# Patient Record
Sex: Male | Born: 1945
Health system: Southern US, Community
[De-identification: ages and names within clinical notes are randomized; demographics above are authoritative.]

## PROBLEM LIST (undated history)

## (undated) DIAGNOSIS — R7303 Prediabetes: Secondary | ICD-10-CM

## (undated) DIAGNOSIS — G4730 Sleep apnea, unspecified: Secondary | ICD-10-CM

## (undated) DIAGNOSIS — C61 Malignant neoplasm of prostate: Secondary | ICD-10-CM

## (undated) DIAGNOSIS — E119 Type 2 diabetes mellitus without complications: Secondary | ICD-10-CM

## (undated) DIAGNOSIS — B159 Hepatitis A without hepatic coma: Secondary | ICD-10-CM

## (undated) DIAGNOSIS — H5440 Blindness, one eye, unspecified eye: Secondary | ICD-10-CM

## (undated) DIAGNOSIS — IMO0002 Reserved for concepts with insufficient information to code with codable children: Secondary | ICD-10-CM

## (undated) DIAGNOSIS — M1990 Unspecified osteoarthritis, unspecified site: Secondary | ICD-10-CM

## (undated) DIAGNOSIS — R011 Cardiac murmur, unspecified: Secondary | ICD-10-CM

## (undated) DIAGNOSIS — E785 Hyperlipidemia, unspecified: Secondary | ICD-10-CM

## (undated) HISTORY — PX: COLONOSCOPY: SHX174

## (undated) HISTORY — DX: Hyperlipidemia, unspecified: E78.5

## (undated) HISTORY — PX: JOINT REPLACEMENT: SHX530

## (undated) HISTORY — DX: Unspecified osteoarthritis, unspecified site: M19.90

## (undated) HISTORY — PX: EYE SURGERY: SHX253

## (undated) HISTORY — DX: Cardiac murmur, unspecified: R01.1

---

## 2013-08-21 ENCOUNTER — Encounter (INDEPENDENT_AMBULATORY_CARE_PROVIDER_SITE_OTHER): Payer: Self-pay

## 2013-08-21 ENCOUNTER — Other Ambulatory Visit: Payer: Self-pay | Admitting: Family Medicine

## 2013-08-21 ENCOUNTER — Ambulatory Visit
Admission: RE | Admit: 2013-08-21 | Discharge: 2013-08-21 | Disposition: A | Discharge status: Home / Self Care | Payer: Medicare HMO | Source: Ambulatory Visit | Attending: Family Medicine | Admitting: Family Medicine

## 2013-08-21 DIAGNOSIS — E785 Hyperlipidemia, unspecified: Secondary | ICD-10-CM

## 2013-10-05 ENCOUNTER — Other Ambulatory Visit: Payer: Self-pay | Admitting: Orthopaedic Surgery

## 2013-10-16 DIAGNOSIS — IMO0002 Reserved for concepts with insufficient information to code with codable children: Secondary | ICD-10-CM

## 2013-10-16 HISTORY — DX: Reserved for concepts with insufficient information to code with codable children: IMO0002

## 2013-10-25 ENCOUNTER — Emergency Department (HOSPITAL_COMMUNITY): Payer: Medicare HMO

## 2013-10-25 ENCOUNTER — Encounter (HOSPITAL_COMMUNITY): Payer: Self-pay | Admitting: Emergency Medicine

## 2013-10-25 ENCOUNTER — Emergency Department (HOSPITAL_COMMUNITY)
Admission: EM | Admit: 2013-10-25 | Discharge: 2013-10-25 | Disposition: A | Discharge status: Home / Self Care | Payer: Medicare HMO | Attending: Emergency Medicine | Admitting: Emergency Medicine

## 2013-10-25 DIAGNOSIS — M71032 Abscess of bursa, left wrist: Secondary | ICD-10-CM

## 2013-10-25 DIAGNOSIS — Z8546 Personal history of malignant neoplasm of prostate: Secondary | ICD-10-CM | POA: Insufficient documentation

## 2013-10-25 DIAGNOSIS — L02519 Cutaneous abscess of unspecified hand: Secondary | ICD-10-CM | POA: Diagnosis present

## 2013-10-25 DIAGNOSIS — M6789 Other specified disorders of synovium and tendon, multiple sites: Secondary | ICD-10-CM | POA: Insufficient documentation

## 2013-10-25 DIAGNOSIS — L03119 Cellulitis of unspecified part of limb: Secondary | ICD-10-CM

## 2013-10-25 HISTORY — DX: Malignant neoplasm of prostate: C61

## 2013-10-25 LAB — CBC WITH DIFFERENTIAL/PLATELET
Basophils Absolute: 0 10*3/uL (ref 0.0–0.1)
Basophils Relative: 0 % (ref 0–1)
EOS ABS: 0 10*3/uL (ref 0.0–0.7)
Eosinophils Relative: 1 % (ref 0–5)
HCT: 39.3 % (ref 39.0–52.0)
Hemoglobin: 13.4 g/dL (ref 13.0–17.0)
LYMPHS ABS: 2.2 10*3/uL (ref 0.7–4.0)
LYMPHS PCT: 30 % (ref 12–46)
MCH: 30.6 pg (ref 26.0–34.0)
MCHC: 34.1 g/dL (ref 30.0–36.0)
MCV: 89.7 fL (ref 78.0–100.0)
Monocytes Absolute: 0.6 10*3/uL (ref 0.1–1.0)
Monocytes Relative: 8 % (ref 3–12)
NEUTROS ABS: 4.6 10*3/uL (ref 1.7–7.7)
Neutrophils Relative %: 61 % (ref 43–77)
PLATELETS: 244 10*3/uL (ref 150–400)
RBC: 4.38 MIL/uL (ref 4.22–5.81)
RDW: 13.7 % (ref 11.5–15.5)
WBC: 7.5 10*3/uL (ref 4.0–10.5)

## 2013-10-25 LAB — BASIC METABOLIC PANEL
Anion gap: 13 (ref 5–15)
BUN: 19 mg/dL (ref 6–23)
CO2: 23 mEq/L (ref 19–32)
Calcium: 9.1 mg/dL (ref 8.4–10.5)
Chloride: 101 mEq/L (ref 96–112)
Creatinine, Ser: 0.87 mg/dL (ref 0.50–1.35)
GFR, EST NON AFRICAN AMERICAN: 86 mL/min — AB (ref >90)
GLUCOSE: 123 mg/dL — AB (ref 70–99)
POTASSIUM: 4.4 meq/L (ref 3.7–5.3)
SODIUM: 137 meq/L (ref 137–147)

## 2013-10-25 MED ORDER — OXYCODONE-ACETAMINOPHEN 5-325 MG PO TABS
1.0000 | ORAL_TABLET | Freq: Once | ORAL | Status: AC
  Administered 2013-10-25: 1 via ORAL
  Filled 2013-10-25: qty 1

## 2013-10-25 MED ORDER — OXYCODONE-ACETAMINOPHEN 5-325 MG PO TABS: 1.0000 | ORAL_TABLET | ORAL | Status: DC | PRN

## 2013-10-25 MED ORDER — CLINDAMYCIN HCL 300 MG PO CAPS: 300.0000 mg | ORAL_CAPSULE | Freq: Four times a day (QID) | ORAL | Status: DC

## 2013-10-25 MED ORDER — LIDOCAINE-EPINEPHRINE 2 %-1:100000 IJ SOLN
1.7000 mL | Freq: Once | INTRAMUSCULAR | Status: DC
  Filled 2013-10-25: qty 1.7

## 2013-10-25 MED ORDER — CLINDAMYCIN HCL 300 MG PO CAPS
300.0000 mg | ORAL_CAPSULE | Freq: Once | ORAL | Status: AC
  Administered 2013-10-25: 300 mg via ORAL
  Filled 2013-10-25 (×2): qty 1

## 2013-10-25 MED ORDER — LIDOCAINE HCL (PF) 1 % IJ SOLN
INTRAMUSCULAR | Status: AC
  Filled 2013-10-25: qty 10

## 2013-10-25 NOTE — ED Notes (Signed)
Patient at xray

## 2013-10-25 NOTE — Discharge Instructions (Signed)

## 2013-10-25 NOTE — ED Provider Notes (Signed)
CSN: [id number]     Arrival date & time 10/25/13  1448 History   First MD Initiated Contact with Patient 10/25/13 1622     Chief Complaint  Patient presents with  . Abscess    Patient gave verbal permission to utilize photo for medical documentation only The image was not stored on any personal device   Patient is a 68 y.o. male presenting with wrist pain. The history is provided by the patient and a significant other.  Wrist Pain This is a new problem. The current episode started more than 2 days ago. The problem occurs constantly. The problem has been gradually worsening. Pertinent negatives include no chest pain, no abdominal pain and no shortness of breath. Exacerbated by: palpation. The symptoms are relieved by rest.  pt reports left wrist pain for "months" but has noticed increased pain/swelling for past 3 days.  No trauma No fever/vomiting He was seen at PCP today and was sent for evaluation by Hand Surgery  Past Medical History  Diagnosis Date  . Prostate cancer    History reviewed. No pertinent past surgical history. History reviewed. No pertinent family history. History  Substance Use Topics  . Smoking status: Never Smoker   . Smokeless tobacco: Not on file  . Alcohol Use: No    Review of Systems  Constitutional: Negative for fever.  Respiratory: Negative for shortness of breath.   Cardiovascular: Negative for chest pain and leg swelling.  Gastrointestinal: Negative for vomiting and abdominal pain.  Skin: Positive for wound.  Neurological: Negative for weakness.  All other systems reviewed and are negative.     Allergies  Review of patient's allergies indicates no known allergies.  Home Medications   Prior to Admission medications   Not on File   BP 144/70  Pulse 96  Temp(Src) 97.7 F (36.5 C) (Oral)  Resp 20  Ht 5\' 8"  (1.727 m)  Wt 249 lb (112.946 kg)  BMI 37.87 kg/m2  SpO2 95% Physical Exam CONSTITUTIONAL: Well developed/well nourished HEAD:  Normocephalic/atraumatic ENMT: Mucous membranes moist NECK: supple no meningeal signs SPINE:entire spine nontender CV: S1/S2 noted, no murmurs/rubs/gallops noted LUNGS: Lungs are clear to auscultation bilaterally, no apparent distress ABDOMEN: soft, nontender, no rebound or guarding NEURO: Pt is awake/alert, moves all extremitiesx4 EXTREMITIES: pulses normal, full ROM. Pt with limited extension of left index finger (chronic) Area on left wrist warm and fluctuant without drainage.  See photo below SKIN: warm, color normal PSYCH: no abnormalities of mood noted      ED Course  Procedures INCISION AND DRAINAGE Performed by: Sharyon Cable Consent: Verbal consent obtained. Risks and benefits: risks, benefits and alternatives were discussed Type: abscess  Body area: left wrist  Anesthesia: local infiltration  Incision was made with a scalpel.  Local anesthetic: lidocaine 1%  Anesthetic total: 4 ml  Complexity: complex Blunt dissection to break up loculations  Drainage: purulent  Drainage amount: moderate  Patient tolerance: Patient tolerated the procedure well with no immediate complications.     4:46 PM D/w dr Grandville Silos He was called from PCP [facility] about this patient and initial concern for septic joint At this point it appears more to be abscess than septic joint Plan is to perform I&D in the ED 6:36 PM Pt tolerated I&D well He did have some fibrous material ,suspect chronic cyst that had superimposed abscess No evidence of septic joint He will return here for wound check in 2 days D/w dr Grandville Silos, he will see patient next week for definitive  management of his cyst  Labs Review  Labs Reviewed  BASIC METABOLIC PANEL - Abnormal; Notable for the following:    Glucose, Bld 123 (*)    GFR calc non Af Amer 86 (*)    All other components within normal limits  CBC WITH DIFFERENTIAL    Imaging Review Dg Wrist Complete Left  10/25/2013   CLINICAL DATA:  Pain  and swelling, no known injury  EXAM: LEFT WRIST - COMPLETE 3+ VIEW  COMPARISON:  None.  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 Radiocarpal joint joint space narrowing and spurring. Distal radial ulnar joint also shows degenerative change. Degenerative change in the intercarpal joints with a cyst in the proximal capitate. Mild degenerative change base of thumb. Diffuse soft tissue swelling  Negative for fracture.  IMPRESSION: Advanced osteoarthritis.   Electronically Signed   By: Franchot Gallo M.D.   On: 10/25/2013 17:03     MDM   Final diagnoses:  Abscess of bursa of wrist, left    Nursing notes including past medical history and social history reviewed and considered in documentation xrays reviewed and considered Labs/vital reviewed and considered     Sharyon Cable, MD 10/25/13 [phone]

## 2013-10-25 NOTE — ED Notes (Signed)
Pt with left wrist abscess and infection sent here by PCP for eval by Dr Grandville Silos

## 2013-10-26 ENCOUNTER — Encounter (HOSPITAL_COMMUNITY): Payer: Self-pay | Admitting: Emergency Medicine

## 2013-10-26 ENCOUNTER — Emergency Department (HOSPITAL_COMMUNITY): Payer: Medicare HMO

## 2013-10-26 ENCOUNTER — Emergency Department (HOSPITAL_COMMUNITY)
Admission: EM | Admit: 2013-10-26 | Discharge: 2013-10-26 | Disposition: A | Discharge status: Home / Self Care | Payer: Medicare HMO | Attending: Emergency Medicine | Admitting: Emergency Medicine

## 2013-10-26 DIAGNOSIS — M71032 Abscess of bursa, left wrist: Secondary | ICD-10-CM

## 2013-10-26 DIAGNOSIS — R42 Dizziness and giddiness: Secondary | ICD-10-CM | POA: Diagnosis not present

## 2013-10-26 DIAGNOSIS — R112 Nausea with vomiting, unspecified: Secondary | ICD-10-CM | POA: Insufficient documentation

## 2013-10-26 DIAGNOSIS — Z8546 Personal history of malignant neoplasm of prostate: Secondary | ICD-10-CM | POA: Diagnosis not present

## 2013-10-26 DIAGNOSIS — Z7982 Long term (current) use of aspirin: Secondary | ICD-10-CM | POA: Insufficient documentation

## 2013-10-26 DIAGNOSIS — R110 Nausea: Secondary | ICD-10-CM

## 2013-10-26 DIAGNOSIS — M6789 Other specified disorders of synovium and tendon, multiple sites: Secondary | ICD-10-CM | POA: Insufficient documentation

## 2013-10-26 DIAGNOSIS — Z79899 Other long term (current) drug therapy: Secondary | ICD-10-CM | POA: Insufficient documentation

## 2013-10-26 DIAGNOSIS — Z792 Long term (current) use of antibiotics: Secondary | ICD-10-CM | POA: Diagnosis not present

## 2013-10-26 LAB — PRO B NATRIURETIC PEPTIDE: Pro B Natriuretic peptide (BNP): 26.5 pg/mL (ref 0–125)

## 2013-10-26 LAB — BASIC METABOLIC PANEL
Anion gap: 16 — ABNORMAL HIGH (ref 5–15)
BUN: 16 mg/dL (ref 6–23)
CO2: 20 mEq/L (ref 19–32)
Calcium: 9.2 mg/dL (ref 8.4–10.5)
Chloride: 100 mEq/L (ref 96–112)
Creatinine, Ser: 0.78 mg/dL (ref 0.50–1.35)
GFR calc Af Amer: >90 mL/min (ref >90)
GFR calc non Af Amer: >90 mL/min (ref >90)
Glucose, Bld: 193 mg/dL — ABNORMAL HIGH (ref 70–99)
Potassium: 4.2 mEq/L (ref 3.7–5.3)
Sodium: 136 mEq/L — ABNORMAL LOW (ref 137–147)

## 2013-10-26 LAB — TROPONIN I: Troponin I: <0.3 ng/mL (ref <0.30)

## 2013-10-26 LAB — CBC WITH DIFFERENTIAL/PLATELET
Basophils Absolute: 0 10*3/uL (ref 0.0–0.1)
Basophils Relative: 0 % (ref 0–1)
Eosinophils Absolute: 0 10*3/uL (ref 0.0–0.7)
Eosinophils Relative: 0 % (ref 0–5)
HCT: 39.2 % (ref 39.0–52.0)
Hemoglobin: 13.6 g/dL (ref 13.0–17.0)
Lymphocytes Relative: 18 % (ref 12–46)
Lymphs Abs: 1.2 10*3/uL (ref 0.7–4.0)
MCH: 30.2 pg (ref 26.0–34.0)
MCHC: 34.7 g/dL (ref 30.0–36.0)
MCV: 86.9 fL (ref 78.0–100.0)
Monocytes Absolute: 0.6 10*3/uL (ref 0.1–1.0)
Monocytes Relative: 9 % (ref 3–12)
Neutro Abs: 4.8 10*3/uL (ref 1.7–7.7)
Neutrophils Relative %: 73 % (ref 43–77)
Platelets: 228 10*3/uL (ref 150–400)
RBC: 4.51 MIL/uL (ref 4.22–5.81)
RDW: 13.6 % (ref 11.5–15.5)
WBC: 6.6 10*3/uL (ref 4.0–10.5)

## 2013-10-26 MED ORDER — ONDANSETRON HCL 4 MG PO TABS: 4.0000 mg | ORAL_TABLET | Freq: Four times a day (QID) | ORAL | Status: DC

## 2013-10-26 MED ORDER — ONDANSETRON HCL 4 MG/2ML IJ SOLN
4.0000 mg | Freq: Once | INTRAMUSCULAR | Status: AC
  Administered 2013-10-26: 4 mg via INTRAVENOUS
  Filled 2013-10-26: qty 2

## 2013-10-26 MED ORDER — SODIUM CHLORIDE 0.9 % IV BOLUS (SEPSIS)
1000.0000 mL | Freq: Once | INTRAVENOUS | Status: AC
  Administered 2013-10-26: 1000 mL via INTRAVENOUS

## 2013-10-26 MED ORDER — IPRATROPIUM BROMIDE 0.02 % IN SOLN: 0.5000 mg | Freq: Once | RESPIRATORY_TRACT | Status: DC

## 2013-10-26 MED ORDER — ALBUTEROL (5 MG/ML) CONTINUOUS INHALATION SOLN: 10.0000 mg/h | INHALATION_SOLUTION | RESPIRATORY_TRACT | Status: DC

## 2013-10-26 NOTE — Discharge Instructions (Signed)
Nausea, Adult °Nausea means you feel sick to your stomach or need to throw up (vomit). It may be a sign of a more serious problem. If nausea gets worse, you may throw up. If you throw up a lot, you may lose too much body fluid (dehydration). °HOME CARE  °· Get plenty of rest. °· Ask your doctor how to replace body fluid losses (rehydrate). °· Eat small amounts of food. Sip liquids more often. °· Take all medicines as told by your doctor. °GET HELP RIGHT AWAY IF: °· You have a fever. °· You pass out (faint). °· You keep throwing up or have blood in your throw up. °· You are very weak, have dry lips or a dry mouth, or you are very thirsty (dehydrated). °· You have dark or bloody poop (stool). °· You have very bad chest or belly (abdominal) pain. °· You do not get better after 2 days, or you get worse. °· You have a headache. °MAKE SURE YOU: °· Understand these instructions. °· Will watch your condition. °· Will get help right away if you are not doing well or get worse. °Document Released: 01/21/2011 Document Revised: 04/26/2011 Document Reviewed: 01/21/2011 °ExitCare® Patient Information ©2015 [employer]. This information is not intended to replace advice given to you by your health care provider. Make sure you discuss any questions you have with your health care provider. ° °

## 2013-10-26 NOTE — ED Notes (Signed)
Seen yesterday for cyst (drained) on left hand, c/o N/V and dizziness this AM, VSS, ambulatory with assist, A/O - NAD

## 2013-10-26 NOTE — ED Provider Notes (Signed)
CSN: [id number]     Arrival date & time 10/26/13  1046 History   First MD Initiated Contact with Patient 10/26/13 1138     Chief Complaint  Patient presents with  . Nausea  . Emesis  . Dizziness     (Consider location/radiation/quality/duration/timing/severity/associated sxs/prior Treatment) HPI  68yM with nausea. Seen in ED yesterday and had I&D abscess to distal L wrist. Prescribed percocet/clindamycin and instructed to follow up with hand surgery. This morning had nausea. No abdominal pain. Reports pain in wrist significantly improved. Swelling has decreased. No fever or chills. No sick contacts. No diarrhea. No urinary complaints. Nausea did start shortly after taking percocet. No other new exposures that he is aware of.   Past Medical History  Diagnosis Date  . Prostate cancer    History reviewed. No pertinent past surgical history. No family history on file. History  Substance Use Topics  . Smoking status: Never Smoker   . Smokeless tobacco: Not on file  . Alcohol Use: No    Review of Systems  All systems reviewed and negative, other than as noted in HPI.   Allergies  Review of patient's allergies indicates no known allergies.  Home Medications   Prior to Admission medications   Medication Sig Start Date End Date Taking? Authorizing Provider  aspirin 325 MG EC tablet Take 325 mg by mouth daily.   Yes Historical Provider, MD  atorvastatin (LIPITOR) 10 MG tablet Take 10 mg by mouth every morning.   Yes Historical Provider, MD  clindamycin (CLEOCIN) 300 MG capsule Take 1 capsule (300 mg total) by mouth 4 (four) times daily. X 7 days 10/25/13  Yes Sharyon Cable, MD  oxyCODONE-acetaminophen (PERCOCET/ROXICET) 5-325 MG per tablet Take 1 tablet by mouth every 4 (four) hours as needed for severe pain. 10/25/13  Yes Sharyon Cable, MD  bicalutamide (CASODEX) 50 MG tablet Take 150 mg by mouth every morning.    Historical Provider, MD  ondansetron (ZOFRAN) 4 MG tablet  Take 1 tablet (4 mg total) by mouth every 6 (six) hours. 10/26/13   Virgel Manifold, MD   BP 132/68  Pulse 76  Temp(Src) 97.8 F (36.6 C) (Oral)  Resp 24  SpO2 95% Physical Exam  Nursing note and vitals reviewed. Constitutional: He appears well-developed and well-nourished. No distress.  HENT:  Head: Normocephalic and atraumatic.  Eyes: Conjunctivae are normal. Right eye exhibits no discharge. Left eye exhibits no discharge.  L cornea opaque  Neck: Neck supple.  Cardiovascular: Normal rate, regular rhythm and normal heart sounds.  Exam reveals no gallop and no friction rub.   No murmur heard. Pulmonary/Chest: Effort normal and breath sounds normal. No respiratory distress.  Abdominal: Soft. He exhibits no distension. There is no tenderness.  Musculoskeletal:       Arms: Some yellow staining of bandages. Removed. Lesion consistent with recently I&D'd abscess to depicted area. Some oozing of thin clear/translucent straw colored fluid. Able express a small amount more with palpation just distal to previous incision. Denies any pain with palpation in this area. No surrounding erythema or appreciably increased warmth. Able to actively range wrist but decreased to about 60 degree arc because of pain. NVI distally.   Neurological: He is alert.  Skin: Skin is warm and dry.  Psychiatric: He has a normal mood and affect. His behavior is normal. Thought content normal.    ED Course  Procedures (including critical care time) Labs Review Labs Reviewed  BASIC METABOLIC PANEL - Abnormal; Notable for  the following:    Sodium 136 (*)    Glucose, Bld 193 (*)    Anion gap 16 (*)    All other components within normal limits  CBC WITH DIFFERENTIAL  TROPONIN I  PRO B NATRIURETIC PEPTIDE    Imaging Review Dg Chest 2 View  10/26/2013   CLINICAL DATA:  Nausea, vomiting  EXAM: CHEST  2 VIEW  COMPARISON:  08/21/2013  FINDINGS: The heart size and mediastinal contours are within normal limits. Both lungs  are clear. The visualized skeletal structures are unremarkable.  IMPRESSION: No active cardiopulmonary disease.   Electronically Signed   By: Kathreen Devoid   On: 10/26/2013 12:32   Dg Wrist Complete Left  10/25/2013   CLINICAL DATA:  Pain and swelling, no known injury  EXAM: LEFT WRIST - COMPLETE 3+ VIEW  COMPARISON:  None.  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 Radiocarpal joint joint space narrowing and spurring. Distal radial ulnar joint also shows degenerative change. Degenerative change in the intercarpal joints with a cyst in the proximal capitate. Mild degenerative change base of thumb. Diffuse soft tissue swelling  Negative for fracture.  IMPRESSION: Advanced osteoarthritis.   Electronically Signed   By: Franchot Gallo M.D.   On: 10/25/2013 17:03     EKG Interpretation   Date/Time:  Friday October 26 2013 11:14:57 EDT Ventricular Rate:  69 PR Interval:  175 QRS Duration: 102 QT Interval:  441 QTC Calculation: 472 R Axis:   13 Text Interpretation:  Sinus rhythm Low voltage, precordial leads RSR' in  V1  No old tracing to compare Confirmed by Senath  MD, [place] ([id number]) on  10/26/2013 3:04:48 PM      MDM   Final diagnoses:  Nausea  Abscess of bursa, wrist, left    68yM with nausea. Recent I&D of abscess to dorsal aspect L wrist. Looks improved from pictures obtained yesterday. Reports pain significantly improved. There is continued drainage though. Does not look purulent. Clear straw colored fluid. Serous versus synovial? Discuss with Dr Grandville Silos, hand surgery. Feels that appropriate to continue abx at this time and follow-up in office.   Suspect nausea related to percocet as it began shortly after taking it. Denies abdominal pain or any other acute pain for that matter. Abdominal exam benign. Afebrile. Labs fairly unremarkable. Symptoms resolved after dose of zofran. Will DC with the same. Wound re-dressed and provided additional supplies. Continued care and return  precautions dicussed with pt/wife. FU with hand surgery.    Virgel Manifold, MD 10/26/13 [phone]

## 2013-11-09 ENCOUNTER — Other Ambulatory Visit: Payer: Self-pay | Admitting: Orthopedic Surgery

## 2013-11-09 DIAGNOSIS — M25532 Pain in left wrist: Secondary | ICD-10-CM

## 2013-11-18 ENCOUNTER — Ambulatory Visit
Admission: RE | Admit: 2013-11-18 | Discharge: 2013-11-18 | Disposition: A | Discharge status: Home / Self Care | Payer: Medicare HMO | Source: Ambulatory Visit | Attending: Orthopedic Surgery | Admitting: Orthopedic Surgery

## 2013-11-18 DIAGNOSIS — M25532 Pain in left wrist: Secondary | ICD-10-CM

## 2013-11-18 MED ORDER — GADOBENATE DIMEGLUMINE 529 MG/ML IV SOLN
20.0000 mL | Freq: Once | INTRAVENOUS | Status: AC | PRN
  Administered 2013-11-18: 20 mL via INTRAVENOUS

## 2013-11-20 ENCOUNTER — Encounter (HOSPITAL_COMMUNITY): Payer: Self-pay | Admitting: Pharmacy Technician

## 2013-11-22 NOTE — Pre-Procedure Instructions (Signed)
Samuel [name]  11/22/2013   Your procedure is scheduled on:  Tuesday, October 20th  Report to [facility] at [id number] AM.  Call this number if you have problems the morning of surgery: [phone]   Remember:   Do not eat food or drink liquids after midnight.   Take these medicines the morning of surgery with A SIP OF WATER: none   Do not wear jewelry, make-up or nail polish.  Do not wear lotions, powders, or perfumes. You may wear deodorant.  Do not shave 48 hours prior to surgery. Men may shave face and neck.  Do not bring valuables to the hospital.  [facility] is not responsible  for any belongings or valuables.               Contacts, dentures or bridgework may not be worn into surgery.  Leave suitcase in the car. After surgery it may be brought to your room.  For patients admitted to the hospital, discharge time is determined by your  treatment team.           Please read over the following fact sheets that you were given: Pain Booklet, Coughing and Deep Breathing, Blood Transfusion Information, MRSA Information and Surgical Site Infection Prevention [place] - Preparing for Surgery  Before surgery, you can play an important role.  Because skin is not sterile, your skin needs to be as free of germs as possible.  You can reduce the number of germs on you skin by washing with CHG (chlorahexidine gluconate) soap before surgery.  CHG is an antiseptic cleaner which kills germs and bonds with the skin to continue killing germs even after washing.  Please DO NOT use if you have an allergy to CHG or antibacterial soaps.  If your skin becomes reddened/irritated stop using the CHG and inform your nurse when you arrive at Short Stay.  Do not shave (including legs and underarms) for at least 48 hours prior to the first CHG shower.  You may shave your face.  Please follow these instructions carefully:   1.  Shower with CHG Soap the night before surgery and the morning of  Surgery.  2.  If you choose to wash your hair, wash your hair first as usual with your normal shampoo.  3.  After you shampoo, rinse your hair and body thoroughly to remove the shampoo.  4.  Use CHG as you would any other liquid soap.  You can apply CHG directly to the skin and wash gently with scrungie or a clean washcloth.  5.  Apply the CHG Soap to your body ONLY FROM THE NECK DOWN.  Do not use on open wounds or open sores.  Avoid contact with your eyes, ears, mouth and genitals (private parts).  Wash genitals (private parts) with your normal soap.  6.  Wash thoroughly, paying special attention to the area where your surgery will be performed.  7.  Thoroughly rinse your body with warm water from the neck down.  8.  DO NOT shower/wash with your normal soap after using and rinsing off the CHG Soap.  9.  Pat yourself dry with a clean towel.            10.  Wear clean pajamas.            11.  Place clean sheets on your bed the night of your first shower and do not sleep with pets.  Day of Surgery  Do not apply  any lotions/deoderants the morning of surgery.  Please wear clean clothes to the hospital/surgery center.

## 2013-11-23 ENCOUNTER — Encounter (HOSPITAL_COMMUNITY): Payer: Self-pay

## 2013-11-23 ENCOUNTER — Encounter (HOSPITAL_COMMUNITY)
Admission: RE | Admit: 2013-11-23 | Discharge: 2013-11-23 | Disposition: A | Discharge status: Home / Self Care | Payer: Medicare HMO | Source: Ambulatory Visit | Attending: Orthopaedic Surgery | Admitting: Orthopaedic Surgery

## 2013-11-23 DIAGNOSIS — Z8546 Personal history of malignant neoplasm of prostate: Secondary | ICD-10-CM | POA: Diagnosis not present

## 2013-11-23 DIAGNOSIS — E119 Type 2 diabetes mellitus without complications: Secondary | ICD-10-CM | POA: Insufficient documentation

## 2013-11-23 DIAGNOSIS — H5442 Blindness, left eye, normal vision right eye: Secondary | ICD-10-CM | POA: Diagnosis not present

## 2013-11-23 DIAGNOSIS — M1711 Unilateral primary osteoarthritis, right knee: Secondary | ICD-10-CM | POA: Diagnosis not present

## 2013-11-23 DIAGNOSIS — Z0000 Encounter for general adult medical examination without abnormal findings: Secondary | ICD-10-CM | POA: Insufficient documentation

## 2013-11-23 HISTORY — DX: Type 2 diabetes mellitus without complications: E11.9

## 2013-11-23 HISTORY — DX: Blindness, one eye, unspecified eye: H54.40

## 2013-11-23 HISTORY — DX: Hepatitis a without hepatic coma: B15.9

## 2013-11-23 HISTORY — DX: Sleep apnea, unspecified: G47.30

## 2013-11-23 LAB — URINALYSIS, ROUTINE W REFLEX MICROSCOPIC
Bilirubin Urine: NEGATIVE
Glucose, UA: NEGATIVE mg/dL
Hgb urine dipstick: NEGATIVE
KETONES UR: NEGATIVE mg/dL
LEUKOCYTES UA: NEGATIVE
Nitrite: NEGATIVE
Protein, ur: NEGATIVE mg/dL
Specific Gravity, Urine: 1.011 (ref 1.005–1.030)
Urobilinogen, UA: 0.2 mg/dL (ref 0.0–1.0)
pH: 5.5 (ref 5.0–8.0)

## 2013-11-23 LAB — BASIC METABOLIC PANEL
Anion gap: 16 — ABNORMAL HIGH (ref 5–15)
BUN: 15 mg/dL (ref 6–23)
CALCIUM: 9.6 mg/dL (ref 8.4–10.5)
CHLORIDE: 102 meq/L (ref 96–112)
CO2: 18 meq/L — AB (ref 19–32)
CREATININE: 0.8 mg/dL (ref 0.50–1.35)
GFR, EST NON AFRICAN AMERICAN: 89 mL/min — AB (ref >90)
Glucose, Bld: 114 mg/dL — ABNORMAL HIGH (ref 70–99)
POTASSIUM: 4.3 meq/L (ref 3.7–5.3)
Sodium: 136 mEq/L — ABNORMAL LOW (ref 137–147)

## 2013-11-23 LAB — CBC WITH DIFFERENTIAL/PLATELET
BASOS PCT: 1 % (ref 0–1)
Basophils Absolute: 0 10*3/uL (ref 0.0–0.1)
Eosinophils Absolute: 0 10*3/uL (ref 0.0–0.7)
Eosinophils Relative: 1 % (ref 0–5)
HCT: 41.8 % (ref 39.0–52.0)
HEMOGLOBIN: 14.2 g/dL (ref 13.0–17.0)
Lymphocytes Relative: 46 % (ref 12–46)
Lymphs Abs: 2.2 10*3/uL (ref 0.7–4.0)
MCH: 30 pg (ref 26.0–34.0)
MCHC: 34 g/dL (ref 30.0–36.0)
MCV: 88.4 fL (ref 78.0–100.0)
MONOS PCT: 7 % (ref 3–12)
Monocytes Absolute: 0.3 10*3/uL (ref 0.1–1.0)
Neutro Abs: 2.1 10*3/uL (ref 1.7–7.7)
Neutrophils Relative %: 45 % (ref 43–77)
PLATELETS: 204 10*3/uL (ref 150–400)
RBC: 4.73 MIL/uL (ref 4.22–5.81)
RDW: 14 % (ref 11.5–15.5)
WBC: 4.7 10*3/uL (ref 4.0–10.5)

## 2013-11-23 LAB — ABO/RH: ABO/RH(D): B POS

## 2013-11-23 LAB — TYPE AND SCREEN
ABO/RH(D): B POS
Antibody Screen: NEGATIVE

## 2013-11-23 LAB — COMPREHENSIVE METABOLIC PANEL
ALBUMIN: 4 g/dL (ref 3.5–5.2)
ALT: 19 U/L (ref 0–53)
AST: 26 U/L (ref 0–37)
Alkaline Phosphatase: 101 U/L (ref 39–117)
Anion gap: 18 — ABNORMAL HIGH (ref 5–15)
BUN: 16 mg/dL (ref 6–23)
CALCIUM: 9.8 mg/dL (ref 8.4–10.5)
CO2: 16 meq/L — AB (ref 19–32)
Chloride: 102 mEq/L (ref 96–112)
Creatinine, Ser: 1.33 mg/dL (ref 0.50–1.35)
GFR calc Af Amer: 61 mL/min — ABNORMAL LOW (ref >90)
GFR calc non Af Amer: 53 mL/min — ABNORMAL LOW (ref >90)
Glucose, Bld: 111 mg/dL — ABNORMAL HIGH (ref 70–99)
Potassium: 4.6 mEq/L (ref 3.7–5.3)
SODIUM: 136 meq/L — AB (ref 137–147)
TOTAL PROTEIN: 8 g/dL (ref 6.0–8.3)
Total Bilirubin: 0.4 mg/dL (ref 0.3–1.2)

## 2013-11-23 LAB — PROTIME-INR
INR: 0.99 (ref 0.00–1.49)
Prothrombin Time: 13.1 seconds (ref 11.6–15.2)

## 2013-11-23 LAB — SURGICAL PCR SCREEN
MRSA, PCR: NEGATIVE
Staphylococcus aureus: NEGATIVE

## 2013-11-23 LAB — APTT: APTT: 24 s (ref 24–37)

## 2013-11-28 NOTE — H&P (Signed)
TOTAL KNEE ADMISSION H&P  Patient is being admitted for right total knee arthroplasty.  Subjective:  Chief Complaint:right knee pain.  HPI: Samuel [name], 68 y.o. male, has a history of pain and functional disability in the right knee due to arthritis and has failed non-surgical conservative treatments for greater than 12 weeks to includeNSAID's and/or analgesics, corticosteriod injections, flexibility and strengthening excercises, use of assistive devices, weight reduction as appropriate and activity modification.  Onset of symptoms was gradual, starting 5 years ago with gradually worsening course since that time. The patient noted no past surgery on the right knee(s).  Patient currently rates pain in the right knee(s) at 10 out of 10 with activity. Patient has night pain, worsening of pain with activity and weight bearing, pain that interferes with activities of daily living, crepitus and joint swelling.  Patient has evidence of subchondral cysts, subchondral sclerosis, periarticular osteophytes and joint space narrowing by imaging studies. There is no active infection.  There are no active problems to display for this patient.  Past Medical History  Diagnosis Date  . Prostate cancer   . Sleep apnea     ????   O2 DESAT  10/26/13  IN ED    . Hepatitis A     25 YRS AGO  . Blind left eye   . Diabetes mellitus without complication     ?? borderline  being checked    Past Surgical History  Procedure Laterality Date  . Joint replacement      LT KNEE 3 YRS AGO   . Eye surgery      LEFT AYE AS CHILD     No prescriptions prior to admission   No Known Allergies  History  Substance Use Topics  . Smoking status: Former Research scientist (life sciences)  . Smokeless tobacco: Not on file  . Alcohol Use: No    No family history on file.   Review of Systems  Musculoskeletal: Positive for joint pain.       Right knee.  All other systems reviewed and are negative.   Objective:  Physical Exam  Constitutional: He  is oriented to person, place, and time. He appears well-developed and well-nourished.  HENT:  Head: Normocephalic and atraumatic.  Eyes: Pupils are equal, round, and reactive to light.  Neck: Normal range of motion.  Cardiovascular: Normal rate and regular rhythm.   Respiratory: Effort normal.  GI: Soft.  Musculoskeletal:  Right knee has no scars and no effusion.  A mild varus deformity.  His range of motion is about 0-100.  Hip motion is good and straight leg raise is negative.  Sensation and motor function are intact in both feet with palpable pulses on both sides.  His opposite knee has a healed incision with no effusion and motion from 0-100 as well.    Neurological: He is alert and oriented to person, place, and time.  Skin: Skin is warm and dry.  Psychiatric: He has a normal mood and affect. His behavior is normal. Judgment and thought content normal.    Vital signs in last 24 hours:    Labs:   There is no height or weight on file to calculate BMI.   Imaging Review Plain radiographs demonstrate severe degenerative joint disease of the right knee(s). The overall alignment isneutral. The bone quality appears to be good for age and reported activity level.  Assessment/Plan:  End stage arthritis, right knee   The patient history, physical examination, clinical judgment of the provider and imaging studies are  consistent with end stage degenerative joint disease of the right knee(s) and total knee arthroplasty is deemed medically necessary. The treatment options including medical management, injection therapy arthroscopy and arthroplasty were discussed at length. The risks and benefits of total knee arthroplasty were presented and reviewed. The risks due to aseptic loosening, infection, stiffness, patella tracking problems, thromboembolic complications and other imponderables were discussed. The patient acknowledged the explanation, agreed to proceed with the plan and consent was  signed. Patient is being admitted for inpatient treatment for surgery, pain control, PT, OT, prophylactic antibiotics, VTE prophylaxis, progressive ambulation and ADL's and discharge planning. The patient is planning to be discharged home with home health services

## 2013-12-03 MED ORDER — CEFAZOLIN SODIUM-DEXTROSE 2-3 GM-% IV SOLR
2.0000 g | INTRAVENOUS | Status: AC
  Administered 2013-12-04: 2 g via INTRAVENOUS
  Filled 2013-12-03: qty 50

## 2013-12-04 ENCOUNTER — Encounter (HOSPITAL_COMMUNITY): Payer: Medicare HMO | Admitting: Anesthesiology

## 2013-12-04 ENCOUNTER — Encounter (HOSPITAL_COMMUNITY): Payer: Self-pay

## 2013-12-04 ENCOUNTER — Inpatient Hospital Stay (HOSPITAL_COMMUNITY): Payer: Medicare HMO | Admitting: Anesthesiology

## 2013-12-04 ENCOUNTER — Encounter (HOSPITAL_COMMUNITY)
Admission: RE | Disposition: A | Discharge status: Home Health | Payer: Self-pay | Source: Ambulatory Visit | Attending: Orthopaedic Surgery

## 2013-12-04 ENCOUNTER — Inpatient Hospital Stay (HOSPITAL_COMMUNITY)
Admission: RE | Admit: 2013-12-04 | Discharge: 2013-12-06 | DRG: 470 | Disposition: A | Discharge status: Home Health | Payer: Medicare HMO | Source: Ambulatory Visit | Attending: Orthopaedic Surgery | Admitting: Orthopaedic Surgery

## 2013-12-04 DIAGNOSIS — G4730 Sleep apnea, unspecified: Secondary | ICD-10-CM | POA: Diagnosis present

## 2013-12-04 DIAGNOSIS — Z6837 Body mass index (BMI) 37.0-37.9, adult: Secondary | ICD-10-CM | POA: Diagnosis not present

## 2013-12-04 DIAGNOSIS — M179 Osteoarthritis of knee, unspecified: Principal | ICD-10-CM | POA: Diagnosis present

## 2013-12-04 DIAGNOSIS — E119 Type 2 diabetes mellitus without complications: Secondary | ICD-10-CM | POA: Diagnosis present

## 2013-12-04 DIAGNOSIS — Z96652 Presence of left artificial knee joint: Secondary | ICD-10-CM | POA: Diagnosis present

## 2013-12-04 DIAGNOSIS — M1711 Unilateral primary osteoarthritis, right knee: Secondary | ICD-10-CM | POA: Diagnosis present

## 2013-12-04 DIAGNOSIS — Z87891 Personal history of nicotine dependence: Secondary | ICD-10-CM

## 2013-12-04 DIAGNOSIS — E669 Obesity, unspecified: Secondary | ICD-10-CM | POA: Diagnosis present

## 2013-12-04 DIAGNOSIS — Z8546 Personal history of malignant neoplasm of prostate: Secondary | ICD-10-CM

## 2013-12-04 DIAGNOSIS — M25561 Pain in right knee: Secondary | ICD-10-CM | POA: Diagnosis present

## 2013-12-04 DIAGNOSIS — H5442 Blindness, left eye, normal vision right eye: Secondary | ICD-10-CM | POA: Diagnosis present

## 2013-12-04 HISTORY — DX: Reserved for concepts with insufficient information to code with codable children: IMO0002

## 2013-12-04 HISTORY — PX: TOTAL KNEE ARTHROPLASTY: SHX125

## 2013-12-04 LAB — GLUCOSE, CAPILLARY
GLUCOSE-CAPILLARY: 109 mg/dL — AB (ref 70–99)
Glucose-Capillary: 105 mg/dL — ABNORMAL HIGH (ref 70–99)
Glucose-Capillary: 145 mg/dL — ABNORMAL HIGH (ref 70–99)

## 2013-12-04 SURGERY — ARTHROPLASTY, KNEE, TOTAL
Anesthesia: Spinal | Site: Knee | Laterality: Right

## 2013-12-04 MED ORDER — ACETAMINOPHEN 650 MG RE SUPP: 650.0000 mg | Freq: Four times a day (QID) | RECTAL | Status: DC | PRN

## 2013-12-04 MED ORDER — HYDROCODONE-ACETAMINOPHEN 5-325 MG PO TABS
1.0000 | ORAL_TABLET | ORAL | Status: DC | PRN
  Administered 2013-12-04 – 2013-12-05 (×2): 2 via ORAL
  Administered 2013-12-05: 1 via ORAL
  Administered 2013-12-05 – 2013-12-06 (×2): 2 via ORAL
  Administered 2013-12-06: 1 via ORAL
  Filled 2013-12-04: qty 2
  Filled 2013-12-04 (×3): qty 1
  Filled 2013-12-04 (×2): qty 2
  Filled 2013-12-04: qty 1

## 2013-12-04 MED ORDER — ONDANSETRON HCL 4 MG PO TABS: 4.0000 mg | ORAL_TABLET | Freq: Four times a day (QID) | ORAL | Status: DC | PRN

## 2013-12-04 MED ORDER — FENTANYL CITRATE 0.05 MG/ML IJ SOLN
INTRAMUSCULAR | Status: AC
  Filled 2013-12-04: qty 5

## 2013-12-04 MED ORDER — LACTATED RINGERS IV SOLN
INTRAVENOUS | Status: DC
  Administered 2013-12-04: via INTRAVENOUS

## 2013-12-04 MED ORDER — SODIUM CHLORIDE 0.9 % IJ SOLN
INTRAMUSCULAR | Status: DC | PRN
  Administered 2013-12-04 (×2): 10 mL via INTRAVENOUS

## 2013-12-04 MED ORDER — MIDAZOLAM HCL 5 MG/5ML IJ SOLN
INTRAMUSCULAR | Status: DC | PRN
  Administered 2013-12-04: 2 mg via INTRAVENOUS

## 2013-12-04 MED ORDER — LACTATED RINGERS IV SOLN
INTRAVENOUS | Status: DC
  Administered 2013-12-04 (×2): via INTRAVENOUS

## 2013-12-04 MED ORDER — METOCLOPRAMIDE HCL 5 MG PO TABS
5.0000 mg | ORAL_TABLET | Freq: Three times a day (TID) | ORAL | Status: DC | PRN
  Filled 2013-12-04: qty 2

## 2013-12-04 MED ORDER — BICALUTAMIDE 50 MG PO TABS
150.0000 mg | ORAL_TABLET | Freq: Every morning | ORAL | Status: DC
  Administered 2013-12-05 – 2013-12-06 (×2): 150 mg via ORAL
  Filled 2013-12-04 (×2): qty 3

## 2013-12-04 MED ORDER — ROCURONIUM BROMIDE 50 MG/5ML IV SOLN
INTRAVENOUS | Status: AC
  Filled 2013-12-04: qty 1

## 2013-12-04 MED ORDER — ATORVASTATIN CALCIUM 10 MG PO TABS
10.0000 mg | ORAL_TABLET | Freq: Every morning | ORAL | Status: DC
  Filled 2013-12-04 (×2): qty 1

## 2013-12-04 MED ORDER — PHENOL 1.4 % MT LIQD: 1.0000 | OROMUCOSAL | Status: DC | PRN

## 2013-12-04 MED ORDER — FENTANYL CITRATE 0.05 MG/ML IJ SOLN
INTRAMUSCULAR | Status: DC | PRN
  Administered 2013-12-04: 50 ug via INTRAVENOUS
  Administered 2013-12-04 (×2): 25 ug via INTRAVENOUS

## 2013-12-04 MED ORDER — PROPOFOL INFUSION 10 MG/ML OPTIME
INTRAVENOUS | Status: DC | PRN
  Administered 2013-12-04: 140 ug/kg/min via INTRAVENOUS

## 2013-12-04 MED ORDER — ONDANSETRON HCL 4 MG/2ML IJ SOLN
INTRAMUSCULAR | Status: DC | PRN
  Administered 2013-12-04: 4 mg via INTRAVENOUS

## 2013-12-04 MED ORDER — ALUM & MAG HYDROXIDE-SIMETH 200-200-20 MG/5ML PO SUSP: 30.0000 mL | ORAL | Status: DC | PRN

## 2013-12-04 MED ORDER — OXYCODONE HCL 5 MG PO TABS: 5.0000 mg | ORAL_TABLET | Freq: Once | ORAL | Status: DC | PRN

## 2013-12-04 MED ORDER — ONDANSETRON HCL 4 MG/2ML IJ SOLN
INTRAMUSCULAR | Status: AC
  Filled 2013-12-04: qty 2

## 2013-12-04 MED ORDER — SODIUM CHLORIDE 0.9 % IR SOLN
Status: DC | PRN
  Administered 2013-12-04: 1000 mL

## 2013-12-04 MED ORDER — METHOCARBAMOL 1000 MG/10ML IJ SOLN
500.0000 mg | Freq: Four times a day (QID) | INTRAVENOUS | Status: DC | PRN
  Filled 2013-12-04: qty 5

## 2013-12-04 MED ORDER — MENTHOL 3 MG MT LOZG: 1.0000 | LOZENGE | OROMUCOSAL | Status: DC | PRN

## 2013-12-04 MED ORDER — CHLORHEXIDINE GLUCONATE 4 % EX LIQD: 60.0000 mL | Freq: Once | CUTANEOUS | Status: DC

## 2013-12-04 MED ORDER — HYDROMORPHONE HCL 1 MG/ML IJ SOLN
0.5000 mg | INTRAMUSCULAR | Status: DC | PRN
  Administered 2013-12-04 – 2013-12-06 (×6): 1 mg via INTRAVENOUS
  Filled 2013-12-04 (×6): qty 1

## 2013-12-04 MED ORDER — PROPOFOL 10 MG/ML IV BOLUS
INTRAVENOUS | Status: AC
  Filled 2013-12-04: qty 20

## 2013-12-04 MED ORDER — CEFAZOLIN SODIUM-DEXTROSE 2-3 GM-% IV SOLR
2.0000 g | Freq: Four times a day (QID) | INTRAVENOUS | Status: DC
  Filled 2013-12-04 (×2): qty 50

## 2013-12-04 MED ORDER — HYDROMORPHONE HCL 1 MG/ML IJ SOLN: 0.2500 mg | INTRAMUSCULAR | Status: DC | PRN

## 2013-12-04 MED ORDER — METOCLOPRAMIDE HCL 5 MG/ML IJ SOLN: 5.0000 mg | Freq: Three times a day (TID) | INTRAMUSCULAR | Status: DC | PRN

## 2013-12-04 MED ORDER — MIDAZOLAM HCL 2 MG/2ML IJ SOLN
INTRAMUSCULAR | Status: AC
  Filled 2013-12-04: qty 2

## 2013-12-04 MED ORDER — ONDANSETRON HCL 4 MG/2ML IJ SOLN
4.0000 mg | Freq: Four times a day (QID) | INTRAMUSCULAR | Status: AC | PRN
  Administered 2013-12-04: 4 mg via INTRAVENOUS

## 2013-12-04 MED ORDER — BUPIVACAINE IN DEXTROSE 0.75-8.25 % IT SOLN
INTRATHECAL | Status: DC | PRN
  Administered 2013-12-04: 15 mg via INTRATHECAL

## 2013-12-04 MED ORDER — ACETAMINOPHEN 325 MG PO TABS: 650.0000 mg | ORAL_TABLET | Freq: Four times a day (QID) | ORAL | Status: DC | PRN

## 2013-12-04 MED ORDER — ASPIRIN EC 325 MG PO TBEC
325.0000 mg | DELAYED_RELEASE_TABLET | Freq: Two times a day (BID) | ORAL | Status: DC
  Administered 2013-12-05 – 2013-12-06 (×3): 325 mg via ORAL
  Filled 2013-12-04 (×5): qty 1

## 2013-12-04 MED ORDER — SODIUM CHLORIDE 0.9 % IV SOLN
2000.0000 mg | INTRAVENOUS | Status: AC
Start: 2013-12-04 — End: 2013-12-04
  Administered 2013-12-04: 2000 mg via TOPICAL
  Filled 2013-12-04: qty 20

## 2013-12-04 MED ORDER — METHOCARBAMOL 500 MG PO TABS
500.0000 mg | ORAL_TABLET | Freq: Four times a day (QID) | ORAL | Status: DC | PRN
  Administered 2013-12-05 – 2013-12-06 (×3): 500 mg via ORAL
  Filled 2013-12-04 (×4): qty 1

## 2013-12-04 MED ORDER — LIDOCAINE HCL (CARDIAC) 20 MG/ML IV SOLN
INTRAVENOUS | Status: AC
  Filled 2013-12-04: qty 5

## 2013-12-04 MED ORDER — CEFAZOLIN SODIUM-DEXTROSE 2-3 GM-% IV SOLR
2.0000 g | Freq: Four times a day (QID) | INTRAVENOUS | Status: AC
  Administered 2013-12-04 – 2013-12-05 (×2): 2 g via INTRAVENOUS
  Filled 2013-12-04 (×2): qty 50

## 2013-12-04 MED ORDER — SODIUM CHLORIDE 0.9 % IV SOLN
10.0000 mg | INTRAVENOUS | Status: DC | PRN
  Administered 2013-12-04: 10 ug/min via INTRAVENOUS

## 2013-12-04 MED ORDER — LACTATED RINGERS IV SOLN
INTRAVENOUS | Status: DC
Start: 2013-12-04 — End: 2013-12-04
  Administered 2013-12-04: 09:00:00 via INTRAVENOUS

## 2013-12-04 MED ORDER — BISACODYL 5 MG PO TBEC
5.0000 mg | DELAYED_RELEASE_TABLET | Freq: Every day | ORAL | Status: DC | PRN
Start: 2013-12-04 — End: 2013-12-06

## 2013-12-04 MED ORDER — BUPIVACAINE LIPOSOME 1.3 % IJ SUSP
20.0000 mL | INTRAMUSCULAR | Status: AC
  Administered 2013-12-04: 20 mL
  Filled 2013-12-04: qty 20

## 2013-12-04 MED ORDER — ONDANSETRON HCL 4 MG/2ML IJ SOLN
4.0000 mg | Freq: Four times a day (QID) | INTRAMUSCULAR | Status: DC | PRN
Start: 2013-12-04 — End: 2013-12-06
  Administered 2013-12-04: 4 mg via INTRAVENOUS
  Filled 2013-12-04: qty 2

## 2013-12-04 MED ORDER — DIPHENHYDRAMINE HCL 12.5 MG/5ML PO ELIX: 12.5000 mg | ORAL_SOLUTION | ORAL | Status: DC | PRN

## 2013-12-04 MED ORDER — OXYCODONE HCL 5 MG/5ML PO SOLN: 5.0000 mg | Freq: Once | ORAL | Status: DC | PRN

## 2013-12-04 MED ORDER — DOCUSATE SODIUM 100 MG PO CAPS
100.0000 mg | ORAL_CAPSULE | Freq: Two times a day (BID) | ORAL | Status: DC
  Administered 2013-12-04 – 2013-12-06 (×4): 100 mg via ORAL
  Filled 2013-12-04 (×6): qty 1

## 2013-12-04 SURGICAL SUPPLY — 68 items
BANDAGE ELASTIC 4 VELCRO ST LF (GAUZE/BANDAGES/DRESSINGS) ×2 IMPLANT
BANDAGE ELASTIC 6 VELCRO ST LF (GAUZE/BANDAGES/DRESSINGS) ×2 IMPLANT
BANDAGE ESMARK 6X9 LF (GAUZE/BANDAGES/DRESSINGS) ×1 IMPLANT
BENZOIN TINCTURE PRP APPL 2/3 (GAUZE/BANDAGES/DRESSINGS) ×2 IMPLANT
BLADE SAG 18X100X1.27 (BLADE) IMPLANT
BLADE SAGITTAL 13X1.27X60 (BLADE) IMPLANT
BLADE SAGITTAL 25.0X1.19X90 (BLADE) IMPLANT
BLADE SURG 10 STRL SS (BLADE) ×2 IMPLANT
BLADE SURG ROTATE 9660 (MISCELLANEOUS) IMPLANT
BNDG ELASTIC 6X10 VLCR STRL LF (GAUZE/BANDAGES/DRESSINGS) ×2 IMPLANT
BNDG ESMARK 6X9 LF (GAUZE/BANDAGES/DRESSINGS) ×2
BNDG GAUZE ELAST 4 BULKY (GAUZE/BANDAGES/DRESSINGS) ×4 IMPLANT
BOWL SMART MIX CTS (DISPOSABLE) ×2 IMPLANT
CAP UPCHARGE REVISION TRAY ×2 IMPLANT
CAPT RP KNEE ×2 IMPLANT
CEMENT HV SMART SET (Cement) ×4 IMPLANT
CLSR STERI-STRIP ANTIMIC 1/2X4 (GAUZE/BANDAGES/DRESSINGS) ×2 IMPLANT
COVER SURGICAL LIGHT HANDLE (MISCELLANEOUS) ×2 IMPLANT
CUFF TOURNIQUET SINGLE 34IN LL (TOURNIQUET CUFF) ×2 IMPLANT
CUFF TOURNIQUET SINGLE 44IN (TOURNIQUET CUFF) IMPLANT
DRAPE EXTREMITY T 121X128X90 (DRAPE) ×2 IMPLANT
DRAPE PROXIMA HALF (DRAPES) ×2 IMPLANT
DRAPE U-SHAPE 47X51 STRL (DRAPES) ×2 IMPLANT
DRSG ADAPTIC 3X8 NADH LF (GAUZE/BANDAGES/DRESSINGS) ×2 IMPLANT
DRSG PAD ABDOMINAL 8X10 ST (GAUZE/BANDAGES/DRESSINGS) ×2 IMPLANT
DURAPREP 26ML APPLICATOR (WOUND CARE) ×2 IMPLANT
ELECT REM PT RETURN 9FT ADLT (ELECTROSURGICAL) ×2
ELECTRODE REM PT RTRN 9FT ADLT (ELECTROSURGICAL) ×1 IMPLANT
GAUZE SPONGE 4X4 12PLY STRL (GAUZE/BANDAGES/DRESSINGS) ×2 IMPLANT
GLOVE BIO SURGEON STRL SZ 6.5 (GLOVE) ×4 IMPLANT
GLOVE BIO SURGEON STRL SZ7 (GLOVE) ×8 IMPLANT
GLOVE BIO SURGEON STRL SZ8 (GLOVE) ×4 IMPLANT
GLOVE BIOGEL PI IND STRL 8 (GLOVE) ×2 IMPLANT
GLOVE BIOGEL PI INDICATOR 8 (GLOVE) ×2
GOWN STRL REUS W/ TWL LRG LVL3 (GOWN DISPOSABLE) ×3 IMPLANT
GOWN STRL REUS W/ TWL XL LVL3 (GOWN DISPOSABLE) ×2 IMPLANT
GOWN STRL REUS W/TWL LRG LVL3 (GOWN DISPOSABLE) ×3
GOWN STRL REUS W/TWL XL LVL3 (GOWN DISPOSABLE) ×2
HANDPIECE INTERPULSE COAX TIP (DISPOSABLE) ×1
HOOD PEEL AWAY FACE SHEILD DIS (HOOD) ×4 IMPLANT
IMMOBILIZER KNEE 20 (SOFTGOODS) IMPLANT
IMMOBILIZER KNEE 22 UNIV (SOFTGOODS) ×2 IMPLANT
IMMOBILIZER KNEE 24 THIGH 36 (MISCELLANEOUS) IMPLANT
IMMOBILIZER KNEE 24 UNIV (MISCELLANEOUS)
KIT BASIN OR (CUSTOM PROCEDURE TRAY) ×2 IMPLANT
KIT ROOM TURNOVER OR (KITS) ×2 IMPLANT
MANIFOLD NEPTUNE II (INSTRUMENTS) ×2 IMPLANT
NEEDLE 22X1 1/2 (OR ONLY) (NEEDLE) ×2 IMPLANT
NEEDLE HYPO 21X1 ECLIPSE (NEEDLE) ×2 IMPLANT
NS IRRIG 1000ML POUR BTL (IV SOLUTION) ×2 IMPLANT
PACK TOTAL JOINT (CUSTOM PROCEDURE TRAY) ×2 IMPLANT
PAD ABD 8X10 STRL (GAUZE/BANDAGES/DRESSINGS) ×2 IMPLANT
PAD ARMBOARD 7.5X6 YLW CONV (MISCELLANEOUS) ×4 IMPLANT
SET HNDPC FAN SPRY TIP SCT (DISPOSABLE) ×1 IMPLANT
SPONGE GAUZE 4X4 12PLY STER LF (GAUZE/BANDAGES/DRESSINGS) ×2 IMPLANT
STAPLER VISISTAT 35W (STAPLE) IMPLANT
SUCTION FRAZIER TIP 10 FR DISP (SUCTIONS) ×2 IMPLANT
SUT MNCRL AB 3-0 PS2 18 (SUTURE) IMPLANT
SUT VIC AB 0 CT1 27 (SUTURE) ×2
SUT VIC AB 0 CT1 27XBRD ANBCTR (SUTURE) ×2 IMPLANT
SUT VIC AB 2-0 CT1 27 (SUTURE) ×2
SUT VIC AB 2-0 CT1 TAPERPNT 27 (SUTURE) ×2 IMPLANT
SUT VLOC 180 0 24IN GS25 (SUTURE) ×2 IMPLANT
SYR 50ML LL SCALE MARK (SYRINGE) ×2 IMPLANT
TOWEL OR 17X24 6PK STRL BLUE (TOWEL DISPOSABLE) ×2 IMPLANT
TOWEL OR 17X26 10 PK STRL BLUE (TOWEL DISPOSABLE) ×2 IMPLANT
TRAY FOLEY CATH 14FR (SET/KITS/TRAYS/PACK) IMPLANT
WATER STERILE IRR 1000ML POUR (IV SOLUTION) ×4 IMPLANT

## 2013-12-04 NOTE — Anesthesia Procedure Notes (Addendum)
Spinal  Patient location during procedure: OR Start time: 12/04/2013 10:29 AM End time: 12/04/2013 10:39 AM Staffing Anesthesiologist: Roderic Palau E Performed by: anesthesiologist  Preanesthetic Checklist Completed: patient identified, surgical consent, pre-op evaluation, timeout performed, IV checked, risks and benefits discussed and monitors and equipment checked Spinal Block Patient position: sitting Prep: Betadine Patient monitoring: cardiac monitor, continuous pulse ox and blood pressure Approach: midline Location: L3-4 Injection technique: single-shot Needle Needle type: Quincke and Pencan  Needle gauge: 24 G Needle length: 9 cm Needle insertion depth: 8 cm Additional Notes Functioning IV was confirmed and monitors were applied. Sterile prep and drape, including hand hygiene and sterile gloves were used. The patient was positioned and the spine was prepped. The skin was anesthetized with lidocaine.  Free flow of clear CSF was obtained prior to injecting local anesthetic into the CSF.  The spinal needle aspirated freely following injection.  The needle was carefully withdrawn.  The patient tolerated the procedure well.   Procedure Name: MAC Date/Time: 12/04/2013 10:49 AM Performed by: Kyung Rudd Pre-anesthesia Checklist: Patient identified, Emergency Drugs available, Suction available, Patient being monitored and Timeout performed Patient Re-evaluated:Patient Re-evaluated prior to inductionOxygen Delivery Method: Simple face mask Preoxygenation: Pre-oxygenation with 100% oxygen Intubation Type: IV induction Placement Confirmation: positive ETCO2

## 2013-12-04 NOTE — Progress Notes (Signed)
Moved to bay 18 as requested per [employer] charge.

## 2013-12-04 NOTE — Anesthesia Postprocedure Evaluation (Signed)
  Anesthesia Post-op Note  Patient: Samuel [name]  Procedure(s) Performed: Procedure(s): TOTAL KNEE ARTHROPLASTY (Right)  Patient Location: PACU  Anesthesia Type:Spinal  Level of Consciousness: awake, alert , oriented and patient cooperative  Airway and Oxygen Therapy: Patient Spontanous Breathing  Post-op Pain: none  Post-op Assessment: Post-op Vital signs reviewed, Patient's Cardiovascular Status Stable, Respiratory Function Stable, Patent Airway, No signs of Nausea or vomiting and Pain level controlled  Post-op Vital Signs: stable  Last Vitals:  Filed Vitals:   12/04/13 1800  BP: 122/58  Pulse: 62  Temp:   Resp: 23    Complications: No apparent anesthesia complications

## 2013-12-04 NOTE — Op Note (Signed)
PREOP DIAGNOSIS: DJD RIGHT KNEE POSTOP DIAGNOSIS: same PROCEDURE: RIGHT TKR ANESTHESIA: Spinal and MAC ATTENDING SURGEON: Francena Zender G ASSISTANT: Loni Dolly PA  INDICATIONS FOR PROCEDURE: Samuel [name] is a 68 y.o. male who has struggled for a long time with pain due to degenerative arthritis of the right knee.  The patient has failed many conservative non-operative measures and at this point has pain which limits the ability to sleep and walk.  The patient is offered total knee replacement.  Informed operative consent was obtained after discussion of possible risks of anesthesia, infection, neurovascular injury, DVT, and death.  The importance of the post-operative rehabilitation protocol to optimize result was stressed extensively with the patient.  SUMMARY OF FINDINGS AND PROCEDURE:  Samuel [name] was taken to the operative suite where under the above anesthesia a right knee replacement was performed.  There were advanced degenerative changes and the bone quality was excellent.  We used the DePuy LCS system and placed size large femur, 5 MBT tibia, 41 mm all polyethylene patella, and a size 12.5 mm spacer.  Loni Dolly PA-C assisted throughout and was invaluable to the completion of the case in that he helped retract and maintain exposure while I placed components.  He also helped close thereby minimizing OR time.  The patient was admitted for appropriate post-op care to include perioperative antibiotics and mechanical and pharmacologic measures for DVT prophylaxis.  DESCRIPTION OF PROCEDURE:  Samuel [name] was taken to the operative suite where the above anesthesia was applied.  The patient was positioned supine and prepped and draped in normal sterile fashion.  An appropriate time out was performed.  After the administration of kefzol pre-op antibiotic the leg was elevated and exsanguinated and a tourniquet inflated. A standard longitudinal incision was made on the anterior knee.  Dissection was  carried down to the extensor mechanism.  All appropriate anti-infective measures were used including the pre-operative antibiotic, betadine impregnated drape, and closed hooded exhaust systems for each member of the surgical team.  A medial parapatellar incision was made in the extensor mechanism and the knee cap flipped and the knee flexed.  Some residual meniscal tissues were removed along with any remaining ACL/PCL tissue.  A guide was placed on the tibia and a flat cut was made on it's superior surface.  An intramedullary guide was placed in the femur and was utilized to make anterior and posterior cuts creating an appropriate flexion gap.  A second intramedullary guide was placed in the femur to make a distal cut properly balancing the knee with an extension gap equal to the flexion gap.  The three bones sized to the above mentioned sizes and the appropriate guides were placed and utilized.  A trial reduction was done and the knee easily came to full extension and the patella tracked well on flexion.  The trial components were removed and all bones were cleaned with pulsatile lavage and then dried thoroughly.  Cement was mixed and was pressurized onto the bones followed by placement of the aforementioned components.  Excess cement was trimmed and pressure was held on the components until the cement had hardened.  The tourniquet was deflated and a small amount of bleeding was controlled with cautery and pressure.  The knee was irrigated thoroughly.  The extensor mechanism was re-approximated with V-loc suture in running fashion.  The knee was flexed and the repair was solid.  The subcutaneous tissues were re-approximated with #0 and #2-0 vicryl and the skin closed with a subcuticular stitch  and steristrips.  A sterile dressing was applied.  Intraoperative fluids, EBL, and tourniquet time can be obtained from anesthesia records.  DISPOSITION:  The patient was taken to recovery room in stable condition and  admitted for appropriate post-op care to include peri-operative antibiotic and DVT prophylaxis with mechanical and pharmacologic measures.  Malak Orantes G 12/04/2013, 12:23 PM

## 2013-12-04 NOTE — Transfer of Care (Signed)
Immediate Anesthesia Transfer of Care Note  Patient: Samuel [name]  Procedure(s) Performed: Procedure(s): TOTAL KNEE ARTHROPLASTY (Right)  Patient Location: PACU  Anesthesia Type:MAC  Level of Consciousness: awake, alert  and oriented  Airway & Oxygen Therapy: Patient Spontanous Breathing and Patient connected to face mask oxygen  Post-op Assessment: Report given to PACU RN and Post -op Vital signs reviewed and stable  Post vital signs: Reviewed and stable  Complications: No apparent anesthesia complications

## 2013-12-04 NOTE — Progress Notes (Signed)
Pt still having difficulty bending left knee. Dr.Jackson notified. Will hold pt in pacu until able to bend left knee. Sensation in BLE is increasing . No new orders. Will cont to monitor.

## 2013-12-04 NOTE — Progress Notes (Signed)
Scheduled procedure is on Medicare IP only list.  Will need order for INPATIENT post-op. Thanks you  

## 2013-12-04 NOTE — Progress Notes (Signed)
Orthopedic Tech Progress Note Patient Details:  Samuel [name] Aug 09, 1945 [id number]  CPM Right Knee CPM Right Knee: On Right Knee Flexion (Degrees): 90 Right Knee Extension (Degrees): 0 Additional Comments: trapeze bar patient helper Viewed order from doctor's order list  Hildred Priest 12/04/2013, 1:48 PM

## 2013-12-04 NOTE — Anesthesia Preprocedure Evaluation (Signed)
Anesthesia Evaluation  Patient identified by MRN, date of birth, ID band Patient awake    Reviewed: Allergy & Precautions, H&P , NPO status , Patient's Chart, lab work & pertinent test results  Airway Mallampati: II  Neck ROM: full    Dental   Pulmonary sleep apnea , former smoker,          Cardiovascular negative cardio ROS      Neuro/Psych    GI/Hepatic (+) Hepatitis -, AHepatitis 25 years ago.   Endo/Other  diabetes, Type 2Morbid obesity  Renal/GU      Musculoskeletal   Abdominal   Peds  Hematology   Anesthesia Other Findings   Reproductive/Obstetrics                           Anesthesia Physical Anesthesia Plan  ASA: II  Anesthesia Plan: Spinal   Post-op Pain Management:    Induction: Intravenous  Airway Management Planned: Simple Face Mask  Additional Equipment:   Intra-op Plan:   Post-operative Plan:   Informed Consent: I have reviewed the patients History and Physical, chart, labs and discussed the procedure including the risks, benefits and alternatives for the proposed anesthesia with the patient or authorized representative who has indicated his/her understanding and acceptance.     Plan Discussed with: CRNA, Anesthesiologist and Surgeon  Anesthesia Plan Comments:         Anesthesia Quick Evaluation

## 2013-12-04 NOTE — Interval H&P Note (Signed)
History and Physical Interval Note:  12/04/2013 9:33 AM  Samuel [name]  has presented today for surgery, with the diagnosis of RIGHT KNEE DEGENERATIVE JOINT DISEASE  The various methods of treatment have been discussed with the patient and family. After consideration of risks, benefits and other options for treatment, the patient has consented to  Procedure(s): TOTAL KNEE ARTHROPLASTY (Right) as a surgical intervention .  The patient's history has been reviewed, patient examined, no change in status, stable for surgery.  I have reviewed the patient's chart and labs.  Questions were answered to the patient's satisfaction.     [name]

## 2013-12-05 ENCOUNTER — Encounter (HOSPITAL_COMMUNITY): Payer: Self-pay | Admitting: General Practice

## 2013-12-05 LAB — BASIC METABOLIC PANEL
ANION GAP: 12 (ref 5–15)
BUN: 16 mg/dL (ref 6–23)
CALCIUM: 8.9 mg/dL (ref 8.4–10.5)
CO2: 22 mEq/L (ref 19–32)
CREATININE: 0.78 mg/dL (ref 0.50–1.35)
Chloride: 101 mEq/L (ref 96–112)
Glucose, Bld: 168 mg/dL — ABNORMAL HIGH (ref 70–99)
Potassium: 4.5 mEq/L (ref 3.7–5.3)
Sodium: 135 mEq/L — ABNORMAL LOW (ref 137–147)

## 2013-12-05 LAB — CBC
HEMATOCRIT: 35.7 % — AB (ref 39.0–52.0)
Hemoglobin: 11.9 g/dL — ABNORMAL LOW (ref 13.0–17.0)
MCH: 30.3 pg (ref 26.0–34.0)
MCHC: 33.3 g/dL (ref 30.0–36.0)
MCV: 90.8 fL (ref 78.0–100.0)
Platelets: 204 10*3/uL (ref 150–400)
RBC: 3.93 MIL/uL — ABNORMAL LOW (ref 4.22–5.81)
RDW: 14 % (ref 11.5–15.5)
WBC: 7.7 10*3/uL (ref 4.0–10.5)

## 2013-12-05 LAB — GLUCOSE, CAPILLARY: Glucose-Capillary: 176 mg/dL — ABNORMAL HIGH (ref 70–99)

## 2013-12-05 NOTE — Progress Notes (Signed)
Subjective: 1 Day Post-Op Procedure(s) (LRB): TOTAL KNEE ARTHROPLASTY (Right)  Activity level:  WBAT Diet tolerance:  Eating well Voiding:  ok Patient reports pain as mild and moderate.    Objective: Vital signs in last 24 hours: Temp:  [97.4 F (36.3 C)-98.6 F (37 C)] 98.3 F (36.8 C) (10/21 0512) Pulse Rate:  [52-81] 80 (10/21 0512) Resp:  [2-27] 17 (10/21 0512) BP: (111-141)/(56-81) 138/61 mmHg (10/21 0512) SpO2:  [96 %-100 %] 96 % (10/21 0512) Weight:  [112.492 kg (248 lb)] 112.492 kg (248 lb) (10/20 0812)  Labs:  Recent Labs  12/05/13 0400  HGB 11.9*    Recent Labs  12/05/13 0400  WBC 7.7  RBC 3.93*  HCT 35.7*  PLT 204    Recent Labs  12/05/13 0400  NA 135*  K 4.5  CL 101  CO2 22  BUN 16  CREATININE 0.78  GLUCOSE 168*  CALCIUM 8.9   No results found for this basename: LABPT, INR,  in the last 72 hours  Physical Exam:  Neurologically intact ABD soft Neurovascular intact Sensation intact distally Intact pulses distally Dorsiflexion/Plantar flexion intact Incision: dressing C/D/I and scant drainage No cellulitis present Compartment soft  Assessment/Plan:  1 Day Post-Op Procedure(s) (LRB): TOTAL KNEE ARTHROPLASTY (Right) Advance diet Up with therapy D/C IV fluids Plan for discharge tomorrow Discharge home with home health if doing well and cleared by PT  Continue ASA 325mg  BID for 2 weeks post op Follow up in the office in 2 weeks post op Dressing changed to [place], Priseis Cratty PAUL 12/05/2013, 7:42 AM

## 2013-12-05 NOTE — Evaluation (Signed)
Physical Therapy Evaluation Patient Details Name: Samuel [name] MRN: [id number] DOB: [birth year] Today's Date: 12/05/2013   History of Present Illness  Samuel [name] is a 67 y.o. Male s/p Rt TKA on 12/04/13. 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    Clinical Impression  Pt is s/p Rt TKA POD#1 resulting in the deficits listed below (see PT Problem List).  Pt will benefit from skilled PT to increase their independence and safety with mobility to allow discharge to the venue listed below. Pt mobilizing at min guard for 50' today with RW. Pt will have 24/7 supervision (A) upon D/C. Recommend HHPT at this time vs SNF.      Follow Up Recommendations Home health PT;Supervision/Assistance - 24 hour    Equipment Recommendations  None recommended by PT    Recommendations for Other Services       Precautions / Restrictions Precautions Precautions: Knee Precaution Booklet Issued: Yes (comment) Precaution Comments: given HEP handout and educated on knee precautions Required Braces or Orthoses: Knee Immobilizer - Right Knee Immobilizer - Right: On when out of bed or walking Restrictions Weight Bearing Restrictions: Yes RLE Weight Bearing: Weight bearing as tolerated      Mobility  Bed Mobility               General bed mobility comments: pt up in chair on arrival  Transfers Overall transfer level: Needs assistance Equipment used: Rolling walker (2 wheeled) Transfers: Sit to/from Stand Sit to Stand: Min guard         General transfer comment: min guard to steady from chair; cues for hand placement and safety with RW  Ambulation/Gait Ambulation/Gait assistance: Min guard Ambulation Distance (Feet): 50 Feet Assistive device: Rolling walker (2 wheeled) Gait Pattern/deviations: Step-to pattern;Decreased stance time - right;Decreased step length - left;Antalgic;Trunk flexed;Narrow base of support Gait velocity: decreased due to pain Gait  velocity interpretation: Below normal speed for age/gender General Gait Details: cues for step through gt sequencing and upright posture; pt limited by fatigue   Stairs            Wheelchair Mobility    Modified Rankin (Stroke Patients Only)       Balance Overall balance assessment: Needs assistance Sitting-balance support: No upper extremity supported;Feet supported Sitting balance-Leahy Scale: Fair     Standing balance support: During functional activity;Bilateral upper extremity supported Standing balance-Leahy Scale: Poor Standing balance comment: relies heavily on RW for balance                              Pertinent Vitals/Pain Pain Assessment: No/denies pain Pain Score: 6  Pain Location: Rt Knee when walking Pain Descriptors / Indicators: Constant;Discomfort Pain Intervention(s): Limited activity within patient's tolerance;Monitored during session;Repositioned    Home Living Family/patient expects to be discharged to:: Private residence Living Arrangements: Spouse/significant other Available Help at Discharge: Family;Available 24 hours/day Type of Home: House Home Access: Stairs to enter Entrance Stairs-Rails: None Entrance Stairs-Number of Steps: 1 small step Home Layout: One level Home Equipment: Bedside commode;Walker - 2 wheels Additional Comments: Pt with hx of Lt TKA    Prior Function Level of Independence: Independent               Hand Dominance   Dominant Hand: Right    Extremity/Trunk Assessment   Upper Extremity Assessment: Defer to OT evaluation  Lower Extremity Assessment: RLE deficits/detail RLE Deficits / Details: quad 2+/5; AAROM in sitting -5 to 50 degrees limited by pain     Cervical / Trunk Assessment: Normal  Communication   Communication: No difficulties  Cognition Arousal/Alertness: Awake/alert Behavior During Therapy: WFL for tasks assessed/performed Overall Cognitive Status: Within  Functional Limits for tasks assessed                      General Comments General comments (skin integrity, edema, etc.): discussed D/C recommendation with pt; pt will have 24/7 supervision upon D/C    Exercises Total Joint Exercises Ankle Circles/Pumps: AROM;Both;10 reps;Seated Quad Sets: AROM;Strengthening;Right;10 reps;Seated Hip ABduction/ADduction: AAROM;Strengthening;Right;10 reps;Seated Long Arc Quad: AAROM;Right;5 reps;Seated Goniometric ROM: see Rt LE assessment       Assessment/Plan    PT Assessment Patient needs continued PT services  PT Diagnosis Difficulty walking;Generalized weakness;Acute pain   PT Problem List Decreased strength;Decreased range of motion;Decreased activity tolerance;Decreased balance;Decreased mobility;Decreased knowledge of use of DME;Pain;Decreased knowledge of precautions  PT Treatment Interventions DME instruction;Gait training;Stair training;Functional mobility training;Therapeutic activities;Therapeutic exercise;Balance training;Neuromuscular re-education;Patient/family education   PT Goals (Current goals can be found in the Care Plan section) Acute Rehab PT Goals Patient Stated Goal: to get better then home PT Goal Formulation: With patient Time For Goal Achievement: 12/09/13 Potential to Achieve Goals: Good    Frequency 7X/week   Barriers to discharge        Co-evaluation               End of Session Equipment Utilized During Treatment: Gait belt;Right knee immobilizer Activity Tolerance: Patient tolerated treatment well Patient left: in chair;with call bell/phone within reach Nurse Communication: Mobility status;Precautions;Weight bearing status         Time: 5993-5701 PT Time Calculation (min): 17 min   Charges:   PT Evaluation $Initial PT Evaluation Tier I: 1 Procedure PT Treatments $Gait Training: 8-22 mins   PT G CodesGustavus [name], [place] 12/05/2013, 11:53 AM

## 2013-12-05 NOTE — Progress Notes (Signed)
Physical Therapy Treatment Patient Details Name: Samuel [name] MRN: [id number] DOB: [birth year] Today's Date: 12/05/2013    History of Present Illness Idan Prime is a 68 y.o. Male s/p Rt TKA on 12/04/13. 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      PT Comments    Pt greatly limited by pain this session. Pt required incr cues and min guard to steady with gt due to impulsiveness and incr gt abnormalities secondary to pain. Encouraged pt to get OOB for all meals. Pt still demo decr quadricep activation in Rt LE with exercises.   Follow Up Recommendations  Home health PT;Supervision/Assistance - 24 hour     Equipment Recommendations  None recommended by PT    Recommendations for Other Services       Precautions / Restrictions Precautions Precautions: Knee Precaution Booklet Issued: Yes (comment) Precaution Comments: reinforced no pillow under knee  Required Braces or Orthoses: Knee Immobilizer - Right Knee Immobilizer - Right: On when out of bed or walking Restrictions Weight Bearing Restrictions: Yes RLE Weight Bearing: Weight bearing as tolerated    Mobility  Bed Mobility Overal bed mobility: Needs Assistance Bed Mobility: Sit to Supine       Sit to supine: Min guard   General bed mobility comments: cues for sequencing to use Lt LE to (A) Rt LE into supine position; incr time due to pain; pt relying on trapeze bar to scoot in bed  Transfers Overall transfer level: Needs assistance Equipment used: Rolling walker (2 wheeled) Transfers: Sit to/from Stand Sit to Stand: Min guard         General transfer comment: min guard to steady and position RW; cues for sequencing and hand placement   Ambulation/Gait Ambulation/Gait assistance: Min guard Ambulation Distance (Feet): 55 Feet Assistive device: Rolling walker (2 wheeled) Gait Pattern/deviations: Step-to pattern;Decreased stance time - right;Decreased step length -  left;Antalgic;Trunk flexed Gait velocity: pt impulsively quick and unsafe speed; cues for proper speed and safety Gait velocity interpretation: Below normal speed for age/gender General Gait Details: pt with incr antaglic gt this session primarily due to pain; cues for proper gt sequencing and safety with RW; required multiple rest breaks due to fatigue    Stairs            Wheelchair Mobility    Modified Rankin (Stroke Patients Only)       Balance Overall balance assessment: Needs assistance Sitting-balance support: Feet supported;No upper extremity supported Sitting balance-Leahy Scale: Fair     Standing balance support: During functional activity;Bilateral upper extremity supported Standing balance-Leahy Scale: Poor Standing balance comment: pt heavily relies on RW due to decr ability to WB on Rt LE due to pain                     Cognition Arousal/Alertness: Awake/alert Behavior During Therapy: WFL for tasks assessed/performed Overall Cognitive Status: Within Functional Limits for tasks assessed                      Exercises Total Joint Exercises Ankle Circles/Pumps: AROM;Both;10 reps;Seated Quad Sets: AROM;Strengthening;Right;10 reps;Seated Short Arc Quad: AAROM;Strengthening;Right;5 reps;Other (comment) (limited due to pain and quad strength) Hip ABduction/ADduction: AAROM;Strengthening;Right;10 reps;Supine Long Arc Quad: AAROM;Right;5 reps;Seated Goniometric ROM: see Rt LE assessment     General Comments General comments (skin integrity, edema, etc.): encouarged pt to not refuse pain meds during hospitalization; pt in great deal of pain this session  Pertinent Vitals/Pain Pain Assessment: 0-10 Pain Score: 8  Pain Location: Rt knee primarily with flexion Pain Descriptors / Indicators: Aching Pain Intervention(s): Monitored during session;Limited activity within patient's tolerance;Repositioned;Ice applied;RN gave pain meds during  session    [place] expects to be discharged to:: Private residence Living Arrangements: Spouse/significant other Available Help at Discharge: Family;Available 24 hours/day Type of Home: House Home Access: Stairs to enter Entrance Stairs-Rails: None Home Layout: One level Home Equipment: Bedside commode;Walker - 2 wheels Additional Comments: Pt with hx of Lt TKA    Prior Function Level of Independence: Independent          PT Goals (current goals can now be found in the care plan section) Acute Rehab PT Goals Patient Stated Goal: to not have so much pain  PT Goal Formulation: With patient Time For Goal Achievement: 12/09/13 Potential to Achieve Goals: Good Progress towards PT goals: Progressing toward goals    Frequency  7X/week    PT Plan Current plan remains appropriate    Co-evaluation             End of Session Equipment Utilized During Treatment: Gait belt;Right knee immobilizer Activity Tolerance: Patient limited by pain Patient left: in bed;with call bell/phone within reach     Time: 1421-1445 PT Time Calculation (min): 24 min  Charges:  $Gait Training: 8-22 mins $Therapeutic Exercise: 8-22 mins                    G Codes:      Gustavus Bryant, [id number] 12/05/2013, 2:58 PM

## 2013-12-05 NOTE — Progress Notes (Signed)
Orthopedic Tech Progress Note Patient Details:  Samuel [name] 26-Aug-1945 [id number]  Patient ID: Samuel [name], male   DOB: 01/14/1946, 68 y.o.   MRN: [id number] Placed pt's lle in cpm @ 0-60 degrees @1510 ; will increase as pt tolerates; RN notified  Hildred Priest 12/05/2013, 3:13 PM

## 2013-12-05 NOTE — Progress Notes (Signed)
Occupational Therapy Evaluation Patient Details Name: Samuel [name] MRN: [id number] DOB: 1945-05-22 Today's Date: 12/05/2013    History of Present Illness Samuel [name] is a 68 y.o. Male s/p Rt TKA on 12/04/13. 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     Clinical Impression   PTA pt lived at home with his significant other and was independent with ADLs and functional mobility. Pt is limited by decreased ROM in Rt LE and pain which impair his independence with ADLs. Pt reports that his SO is 72 and cannot provide much physical assistance. At this time, feel that pt would benefit from [place] [facility] Pt will continue to benefit from acute OT to address LB ADLs and functional mobility.     Follow Up Recommendations  SNF;Supervision/Assistance - 24 hour    Equipment Recommendations  None recommended by OT    Recommendations for Other Services       Precautions / Restrictions Precautions Precautions: Knee Required Braces or Orthoses: Knee Immobilizer - Right Restrictions Weight Bearing Restrictions: Yes RLE Weight Bearing: Weight bearing as tolerated      Mobility Bed Mobility               General bed mobility comments: Pt sitting up in recliner when OT arrived.   Transfers Overall transfer level: Needs assistance Equipment used: Rolling walker (2 wheeled) Transfers: Sit to/from Stand Sit to Stand: Min assist         General transfer comment: Min (A) from recliner for safety and balance during transition. VC's for hand placement and sequencing. Pt with uncontrolled descent due to pain.     Balance Overall balance assessment: Needs assistance Sitting-balance support: No upper extremity supported;Feet supported Sitting balance-Leahy Scale: Fair     Standing balance support: Bilateral upper extremity supported;During functional activity Standing balance-Leahy Scale: Poor Standing balance comment: Pt requires UE support for  static standing                            ADL Overall ADL's : Needs assistance/impaired Eating/Feeding: Independent;Sitting   Grooming: Set up;Sitting Grooming Details (indicate cue type and reason): pt ambulated to bathroom for oral care, but required to sit down due to fatigue and deconditioning.  Upper Body Bathing: Set up;Sitting   Lower Body Bathing: Moderate assistance;Sit to/from stand   Upper Body Dressing : Set up;Sitting   Lower Body Dressing: Maximal assistance;Sit to/from stand   Toilet Transfer: Minimal assistance;Ambulation;RW;BSC           Functional mobility during ADLs: Minimal assistance;Rolling walker General ADL Comments: Pt limited by pain and weakness and requires VC's to slow down when walking as he attempts to rush to reach destination. Pt reports that his significant other is 72 and may not be able to provide the physical assistance that pt requires.      Vision  Pt with hx of Lt eye blindness and reports no change from baseline.                    Perception Perception Perception Tested?: No   Praxis Praxis Praxis tested?: Within functional limits    Pertinent Vitals/Pain Pain Assessment: 0-10 Pain Score: 6  Pain Location: Rt Knee when walking Pain Descriptors / Indicators: Constant;Discomfort Pain Intervention(s): Limited activity within patient's tolerance;Monitored during session;Repositioned     Hand Dominance Right   Extremity/Trunk Assessment Upper Extremity Assessment Upper  Extremity Assessment: Overall WFL for tasks assessed   Lower Extremity Assessment Lower Extremity Assessment: Defer to PT evaluation   Cervical / Trunk Assessment Cervical / Trunk Assessment: Normal   Communication Communication Communication: No difficulties   Cognition Arousal/Alertness: Lethargic;Suspect due to medications Behavior During Therapy: [facility] for tasks assessed/performed Overall Cognitive Status: Within Functional Limits  for tasks assessed                                Home Living Family/patient expects to be discharged to:: Private residence Living Arrangements: Spouse/significant other Available Help at Discharge: Family;Available 24 hours/day Type of Home: House Home Access: Stairs to enter [employer] of Steps: 1 small step   Home Layout: One level     Bathroom Shower/Tub: Occupational psychologist: Standard     Home Equipment: Bedside commode;Walker - 2 wheels   Additional Comments: Pt with hx of Lt TKA      Prior Functioning/Environment Level of Independence: Independent             OT Diagnosis: Generalized weakness;Acute pain   OT Problem List: Decreased strength;Decreased range of motion;Decreased activity tolerance;Impaired balance (sitting and/or standing);Decreased safety awareness;Decreased knowledge of use of DME or AE;Decreased knowledge of precautions;Pain   OT Treatment/Interventions: Self-care/ADL training;Therapeutic exercise;Energy conservation;DME and/or AE instruction;Therapeutic activities;Patient/family education;Balance training    OT Goals(Current goals can be found in the care plan section) Acute Rehab OT Goals Patient Stated Goal: to get stronger at rehab before going home OT Goal Formulation: With patient Time For Goal Achievement: 12/19/13 Potential to Achieve Goals: Good ADL Goals Pt Will Perform Grooming: with supervision;standing Pt Will Perform Lower Body Dressing: sit to/from stand;with min assist Pt Will Transfer to Toilet: ambulating;bedside commode;with supervision Pt Will Perform Toileting - Clothing Manipulation and hygiene: sit to/from stand;with min guard assist  OT Frequency: Min 1X/week   Barriers to D/C: Decreased caregiver support             End of Session Equipment Utilized During Treatment: Gait belt;Rolling walker;Right knee immobilizer CPM Right Knee CPM Right Knee: Off Nurse Communication:  Mobility status  Activity Tolerance: Patient tolerated treatment well Patient left: in chair;with call bell/phone within reach   Time: 0854-0920 OT Time Calculation (min): 26 min Charges:  OT General Charges $OT Visit: 1 Procedure OT Evaluation $Initial OT Evaluation Tier I: 1 Procedure OT Treatments $Self Care/Home Management : 8-22 mins  Villa Herb M 12/05/2013, 9:35 AM  Cyndie Chime, OTR/L Occupational Therapist [phone] (pager)

## 2013-12-06 LAB — CBC
HEMATOCRIT: 37 % — AB (ref 39.0–52.0)
Hemoglobin: 12.1 g/dL — ABNORMAL LOW (ref 13.0–17.0)
MCH: 29.7 pg (ref 26.0–34.0)
MCHC: 32.7 g/dL (ref 30.0–36.0)
MCV: 90.9 fL (ref 78.0–100.0)
Platelets: 211 10*3/uL (ref 150–400)
RBC: 4.07 MIL/uL — ABNORMAL LOW (ref 4.22–5.81)
RDW: 13.9 % (ref 11.5–15.5)
WBC: 8.8 10*3/uL (ref 4.0–10.5)

## 2013-12-06 MED ORDER — ASPIRIN 325 MG PO TBEC: 325.0000 mg | DELAYED_RELEASE_TABLET | Freq: Two times a day (BID) | ORAL | Status: DC

## 2013-12-06 MED ORDER — METHOCARBAMOL 500 MG PO TABS: 500.0000 mg | ORAL_TABLET | Freq: Four times a day (QID) | ORAL | Status: DC | PRN

## 2013-12-06 MED ORDER — HYDROCODONE-ACETAMINOPHEN 5-325 MG PO TABS
1.0000 | ORAL_TABLET | ORAL | Status: DC | PRN
Start: 2013-12-06 — End: 2015-04-09

## 2013-12-06 NOTE — Progress Notes (Signed)
Subjective: 2 Days Post-Op Procedure(s) (LRB): TOTAL KNEE ARTHROPLASTY (Right)  Activity level:  wbat Diet tolerance:  Eating well Voiding:  ok Patient reports pain as mild.    Objective: Vital signs in last 24 hours: Temp:  [98.3 F (36.8 C)-100.2 F (37.9 C)] 98.3 F (36.8 C) (10/22 0900) Pulse Rate:  [82-93] 85 (10/22 0900) Resp:  [14-18] 14 (10/22 1146) BP: (126-140)/(65-72) 137/72 mmHg (10/22 0900) SpO2:  [96 %-100 %] 98 % (10/22 1146)  Labs:  Recent Labs  12/05/13 0400 12/06/13 0500  HGB 11.9* 12.1*    Recent Labs  12/05/13 0400 12/06/13 0500  WBC 7.7 8.8  RBC 3.93* 4.07*  HCT 35.7* 37.0*  PLT 204 211    Recent Labs  12/05/13 0400  NA 135*  K 4.5  CL 101  CO2 22  BUN 16  CREATININE 0.78  GLUCOSE 168*  CALCIUM 8.9   No results found for this basename: LABPT, INR,  in the last 72 hours  Physical Exam:  Neurologically intact ABD soft Neurovascular intact Sensation intact distally Intact pulses distally Dorsiflexion/Plantar flexion intact Incision: dressing C/D/I No cellulitis present Compartment soft  Assessment/Plan:  2 Days Post-Op Procedure(s) (LRB): TOTAL KNEE ARTHROPLASTY (Right) Advance diet Up with therapy Discharge home with home health today. Follow up in office 2 weeks post op Continue on ASA 325mg  BID x 2 weeks post op for dvt prevention.     Ysabela Keisler, Larwance Sachs 12/06/2013, 12:48 PM

## 2013-12-06 NOTE — Care Management Note (Signed)
CARE MANAGEMENT NOTE 12/06/2013  Patient:  RORIK, VESPA   Account Number:  [id number]  Date Initiated:  12/06/2013  Documentation initiated by:  Ricki Miller  Subjective/Objective Assessment:   68 yr old male admitted with osteoarthritis of right knee, patient had a right total knee arthroplasty.     Action/Plan:   Patient was preoperatively setup with [name], no changes. Has assistance at discharge.   Anticipated DC Date:  12/06/2013   Anticipated DC Plan:  [place]  CM consult      PAC Choice  [place]   Choice offered to / List presented to:  C-1 Patient   DME arranged  [name]  3-N-1  [facility]      DME agency  [employer]     [employer] arranged  HH-2 PT      [place] agency  [place]   Status of service:  Completed, signed off Medicare Important Message given?  NA - LOS <3 / Initial given by admissions (If response is "NO", the following Medicare IM given date fields will be blank) Date Medicare IM given:   Medicare IM given by:   Date Additional Medicare IM given:   Additional Medicare IM given by:    Discharge Disposition:  [place]  Per UR Regulation:  Reviewed for med. necessity/level of care/duration of stay

## 2013-12-06 NOTE — Discharge Summary (Signed)
Patient ID: Samuel [name] MRN: [id number] DOB/AGE: May 30, 1945 68 y.o.  Admit date: 12/04/2013 Discharge date: 12/06/2013  Admission Diagnoses:  Principal Problem:   Right knee DJD   Discharge Diagnoses:  Same  Past Medical History  Diagnosis Date  . Prostate cancer   . Sleep apnea     ????   O2 DESAT  10/26/13  IN ED    . Hepatitis A     25 YRS AGO  . Blind left eye   . Diabetes mellitus without complication     ?? borderline  being checked  . Cyst in hand 10/2013    left hand, treated with antibiotic & pain medicine    Surgeries: Procedure(s): TOTAL KNEE ARTHROPLASTY on 12/04/2013   Consultants:    Discharged Condition: Improved  Hospital Course: Samuel [name] is an 68 y.o. male who was admitted 12/04/2013 for operative treatment ofRight knee DJD. Patient has severe unremitting pain that affects sleep, daily activities, and work/hobbies. After pre-op clearance the patient was taken to the operating room on 12/04/2013 and underwent  Procedure(s): TOTAL KNEE ARTHROPLASTY.    Patient was given perioperative antibiotics: Anti-infectives   Start     Dose/Rate Route Frequency Ordered Stop   12/04/13 2045  ceFAZolin (ANCEF) IVPB 2 g/50 mL premix     2 g 100 mL/hr over 30 Minutes Intravenous Every 6 hours 12/04/13 2031 12/05/13 0321   12/04/13 1730  ceFAZolin (ANCEF) IVPB 2 g/50 mL premix  Status:  Discontinued     2 g 100 mL/hr over 30 Minutes Intravenous Every 6 hours 12/04/13 1711 12/04/13 2031   12/04/13 0600  ceFAZolin (ANCEF) IVPB 2 g/50 mL premix     2 g 100 mL/hr over 30 Minutes Intravenous On call to O.R. 12/03/13 1401 12/04/13 1100       Patient was given sequential compression devices, early ambulation, and chemoprophylaxis to prevent DVT.  Patient benefited maximally from hospital stay and there were no complications.    Recent vital signs: Patient Vitals for the past 24 hrs:  BP Temp Temp src Pulse Resp SpO2  12/06/13 1146 - - - - 14 98 %  12/06/13 0900  137/72 mmHg 98.3 F (36.8 C) Oral 85 16 100 %  12/06/13 0800 - - - - 16 98 %  12/06/13 0524 126/68 mmHg 100.2 F (37.9 C) Oral 82 18 97 %  12/05/13 1933 140/65 mmHg 99.3 F (37.4 C) Oral 93 17 99 %  12/05/13 1554 - - - - 18 96 %     Recent laboratory studies:  Recent Labs  12/05/13 0400 12/06/13 0500  WBC 7.7 8.8  HGB 11.9* 12.1*  HCT 35.7* 37.0*  PLT 204 211  NA 135*  --   K 4.5  --   CL 101  --   CO2 22  --   BUN 16  --   CREATININE 0.78  --   GLUCOSE 168*  --   CALCIUM 8.9  --      Discharge Medications:     Medication List         aspirin 325 MG EC tablet  Take 1 tablet (325 mg total) by mouth 2 (two) times daily after a meal.     atorvastatin 10 MG tablet  Commonly known as:  LIPITOR  Take 10 mg by mouth every morning.     bicalutamide 50 MG tablet  Commonly known as:  CASODEX  Take 150 mg by mouth every morning.     HYDROcodone-acetaminophen 5-325  MG per tablet  Commonly known as:  NORCO/VICODIN  Take 1-2 tablets by mouth every 4 (four) hours as needed (breakthrough pain).     methocarbamol 500 MG tablet  Commonly known as:  ROBAXIN  Take 1 tablet (500 mg total) by mouth every 6 (six) hours as needed for muscle spasms.        Diagnostic Studies: Mr Wrist Left W Wo Contrast  11/19/2013   CLINICAL DATA:  Left wrist pain for 9 months. Cystic lesion. Numbness in thumb. Arthritis.  EXAM: MR OF THE LEFT WRIST WITHOUT AND WITH CONTRAST  TECHNIQUE: Multiplanar multisequence MR imaging of the left wrist was performed both before and after the administration of intravenous contrast.  CONTRAST:  54mL MULTIHANCE GADOBENATE DIMEGLUMINE 529 MG/ML IV SOLN  COMPARISON:  10/25/2013  FINDINGS: Despite efforts by the technologist and patient, motion artifact is present on today's exam and could not be eliminated. This reduces exam sensitivity and specificity. The patient had to get out of the scanner and move multiple times during the exam and had difficulty holding  still.  Ligaments: The intrinsic interosseous ligaments are very indistinct due to the motion artifact. The scapholunate ligament is thought to be torn. The lunatotriquetral ligament may well be torn. The lunate appears to be slightly distally and slightly anteriorly located with respect to its normal relationship with the scaphoid, which favors the scapholunate ligament tear. The lunate also appears distally located with respect to the triquetrum in a fashion favoring lunatotriquetral tear.  Triangular fibrocartilage: The TFC disc is so degenerated as to be difficult to visualize in various locations, and is felt to be torn. The severe loss of articular space between the triquetrum and ulna is not thought to be compatible with an on torn disc.  Tendons: There is considerable tenosynovitis in the extensor digitorum compartment. Mild second and third extensor compartment tenosynovitis. Mild tenosynovitis of the flexor tendons of the thumb and index finger.  Carpal tunnel/median nerve: Low-grade edema in the carpal tunnel with mild edema of the median nerve.  Guyon's canal: No impingement seen.  Joint/cartilage: Severe full-thickness thinning along much of the chondral surfaces. Marked synovitis. Osteochondral fragments medial to the pisotriquetral articulation appear to be potentially outside of the joint, just posterior to the abductor digiti minimi on image 10 of series 14.  Bones/carpal alignment: Severe erosive arthropathy. Extensive marrow edema in the carpus particularly the scaphoid, lunate, capitate, and hamate.  Other: No supplemental non-categorized findings.  IMPRESSION: 1. 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 There is felt to be disruption of the intrinsic interosseous ligaments as well as the TFC disc, subluxation of the lunate with respect to the scaphoid and triquetrum, potential joint disruption medially with extra-articular osteochondral fragments,  and tenosynovitis most striking in the extensor digitorum compartment but also involving the second and third extensor compartments and several tendons in the carpal tunnel. 2. Mild edema and mild expansion of the median nerve suggests low grade carpal tunnel syndrome.   Electronically Signed   By: Sherryl Barters M.D.   On: 11/19/2013 08:40    Disposition: 01-Home or Self Care      Discharge Instructions   Call MD / Call 911    Complete by:  As directed   If you experience chest pain or shortness of breath, CALL 911 and be transported to the hospital emergency room.  If you develope a fever above 101 F, pus (white drainage) or increased drainage or redness at  the wound, or calf pain, call your surgeon's office.     Constipation Prevention    Complete by:  As directed   Drink plenty of fluids.  Prune juice may be helpful.  You may use a stool softener, such as Colace (over the counter) 100 mg twice a day.  Use MiraLax (over the counter) for constipation as needed.     Diet - low sodium heart healthy    Complete by:  As directed      Increase activity slowly as tolerated    Complete by:  As directed            Follow-up Information   Follow up with Hessie Dibble, MD. Call in 2 weeks.   Specialty:  Orthopedic Surgery   Contact information:   [place] [address] [phone]       Follow up with [place]. (Someone from [facility] will contact you concerning start date and time for physical therapy.)    Specialty:  Home Health Services   Contact information:   [address] [place] [address] [phone]        Signed: Rich Fuchs 12/06/2013, 12:47 PM

## 2013-12-06 NOTE — Progress Notes (Signed)
Physical Therapy Treatment Patient Details Name: Samuel [name] MRN: [id number] DOB: Jul 03, 1945 Today's Date: 12/06/2013    History of Present Illness Samuel [name] is a 68 y.o. Male s/p Rt TKA on 12/04/13. 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      PT Comments    Pt at supervision to min guard for all mobility at this time. Reviewed stair management technique to safely enter house. Pt hopeful to D/C home today. Will have 24/7 supervision (A) available.   Follow Up Recommendations  Home health PT;Supervision/Assistance - 24 hour     Equipment Recommendations  None recommended by PT    Recommendations for Other Services       Precautions / Restrictions Precautions Precautions: Knee Precaution Comments: reviewed knee precautions and HEP Required Braces or Orthoses: Knee Immobilizer - Right Knee Immobilizer - Right: On when out of bed or walking Restrictions Weight Bearing Restrictions: Yes RLE Weight Bearing: Weight bearing as tolerated    Mobility  Bed Mobility Overal bed mobility: Modified Independent             General bed mobility comments: bed flattened to simulate home; no physical (A) needed  Transfers Overall transfer level: Needs assistance Equipment used: Rolling walker (2 wheeled) Transfers: Sit to/from Stand Sit to Stand: Supervision         General transfer comment: cues for technique; pt demo good ability to perform without physical (A)   Ambulation/Gait Ambulation/Gait assistance: Supervision Ambulation Distance (Feet): 150 Feet (75' x 2) Assistive device: Rolling walker (2 wheeled) Gait Pattern/deviations: Step-to pattern;Decreased stance time - right;Decreased step length - left;Antalgic Gait velocity: cues for safe gt speed   General Gait Details: cues for step through gt and upright posture; pt with shuffled step on Lt due to decr ability to WB; pt beginning to progress to more step through gt with  practice    Stairs Stairs: Yes Stairs assistance: Min guard Stair Management: No rails;Step to pattern;Forwards;With walker Number of Stairs: 1 General stair comments: cues for technique; min guard to block RW with stair management   Wheelchair Mobility    Modified Rankin (Stroke Patients Only)       Balance Overall balance assessment: Needs assistance         Standing balance support: During functional activity;Bilateral upper extremity supported Standing balance-Leahy Scale: Poor Standing balance comment: relies on RW for balance                     Cognition Arousal/Alertness: Awake/alert Behavior During Therapy: WFL for tasks assessed/performed Overall Cognitive Status: Within Functional Limits for tasks assessed                      Exercises Total Joint Exercises Ankle Circles/Pumps: AROM;Both;10 reps;Seated Quad Sets: AROM;Strengthening;Right;10 reps;Seated Heel Slides: AAROM;Right;10 reps;Supine Hip ABduction/ADduction: AAROM;Strengthening;Right;10 reps;Seated Long Arc Quad: AAROM;Right;Seated;10 reps Goniometric ROM: Rt LE in sitting AROM 65 degrees    General Comments General comments (skin integrity, edema, etc.): reviewed car transfer technique and HEP      Pertinent Vitals/Pain Pain Assessment: 0-10 Pain Score: 8  Pain Location: with walking only 8/10 in Rt knee  Pain Descriptors / Indicators: Aching;Sore Pain Intervention(s): Monitored during session;Premedicated before session;Repositioned    Home Living                      Prior Function  PT Goals (current goals can now be found in the care plan section) Acute Rehab PT Goals Patient Stated Goal: to go home today  PT Goal Formulation: With patient Time For Goal Achievement: 12/09/13 Potential to Achieve Goals: Good Progress towards PT goals: Progressing toward goals    Frequency  7X/week    PT Plan Current plan remains appropriate     Co-evaluation             End of Session Equipment Utilized During Treatment: Gait belt;Right knee immobilizer Activity Tolerance: Patient tolerated treatment well Patient left: in chair;with call bell/phone within reach     Time: 0806-0831 PT Time Calculation (min): 25 min  Charges:  $Gait Training: 8-22 mins $Therapeutic Exercise: 8-22 mins                    G CodesElie Confer [name], [place] 12/06/2013, 8:45 AM

## 2013-12-25 ENCOUNTER — Encounter: Payer: Self-pay | Admitting: Physical Therapy

## 2013-12-25 ENCOUNTER — Ambulatory Visit: Payer: Medicare HMO | Attending: Orthopaedic Surgery | Admitting: Physical Therapy

## 2013-12-25 VITALS — HR 115

## 2013-12-25 DIAGNOSIS — M25561 Pain in right knee: Secondary | ICD-10-CM | POA: Diagnosis not present

## 2013-12-25 DIAGNOSIS — Z96651 Presence of right artificial knee joint: Secondary | ICD-10-CM

## 2013-12-25 DIAGNOSIS — M25661 Stiffness of right knee, not elsewhere classified: Secondary | ICD-10-CM | POA: Diagnosis not present

## 2013-12-25 NOTE — Therapy (Signed)
Physical Therapy Evaluation  Patient Details  Name: Samuel [name] MRN: [id number] Date of Birth: [birth year]  Encounter Date: 12/25/2013      PT End of Session - 12/25/13 1408    Visit Number 1   Number of Visits 12   Date for PT Re-Evaluation 02/05/14   PT Start Time 1330   PT Stop Time 1406   PT Time Calculation (min) 36 min   Activity Tolerance Patient tolerated treatment well   Behavior During Therapy [facility] for tasks assessed/performed      Past Medical History  Diagnosis Date  . Prostate cancer   . Sleep apnea     ????   O2 DESAT  10/26/13  IN ED    . Hepatitis A     25 YRS AGO  . Blind left eye   . Diabetes mellitus without complication     ?? borderline  being checked  . Cyst in hand 10/2013    left hand, treated with antibiotic & pain medicine    Past Surgical History  Procedure Laterality Date  . Joint replacement      LT KNEE 3 YRS AGO   . Eye surgery      LEFT AYE AS CHILD   . 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 Total knee arthroplasty Right 12/04/2013    Procedure: TOTAL KNEE ARTHROPLASTY;  Surgeon: Hessie Dibble, MD;  Location: [place];  Service: Orthopedics;  Laterality: Right;    Pulse 115  SpO2 97%  Visit Diagnosis:  Status post total right knee replacement - Plan: PT PLAN OF CARE CERT/RE-CERT  Right knee pain - Plan: PT PLAN OF CARE CERT/RE-CERT  Knee stiffness, right - Plan: PT PLAN OF CARE CERT/RE-CERT      Subjective Assessment - 12/25/13 1331    Symptoms Pt. is a 68 y/o male who presents to [place] s/p R TKA on 12/04/13.  Pt. presents today with use of single point cane in the community and no device in the home.  Pt reports needing increased time with activities.     Limitations Sitting;Standing;Walking;House hold activities   How long can you sit comfortably? unlimited   How long can you stand comfortably? 5 min   How long can you walk comfortably? < 5 min   Patient Stated Goals decrease pain, improve ROM, functional  mobility including walking longer distances   Currently in Pain? Yes   Pain Score 5    Pain Location Knee   Pain Orientation Right   Pain Descriptors / Indicators Aching   Pain Type Surgical pain   Pain Onset 1 to 4 weeks ago   Pain Frequency Intermittent   Aggravating Factors  bending the knee   Pain Relieving Factors pain medication, rest   Effect of Pain on Daily Activities increased time with ADLs   Multiple Pain Sites No          [facility] PT Assessment - 12/25/13 1339    Assessment   Medical Diagnosis R TKA   Onset Date 12/04/13   Next MD Visit "a couple weeks"   Prior Therapy [facility]   Precautions   Precautions None   Restrictions   Weight Bearing Restrictions Yes   RLE Weight Bearing Weight bearing as tolerated   Balance Screen   Has the patient fallen in the past 6 months No   Has the patient had a decrease in activity level because of a fear of falling?  Yes   Is  the patient reluctant to leave their home because of a fear of falling?  No   Home Environment   Living Enviornment Private residence   Living Arrangements Spouse/significant other   Available Help at Discharge Available 24 hours/day   Type of [place] to enter   Entrance Stairs-Number of Steps [place] One level   [place] - 2 wheels;Kasandra Knud[place] - single point   Prior Function   Level of Independence Independent with homemaking with ambulation;Independent with gait;Independent with transfers;Requires assistive device for independence  used cane prior to surgery   Vocation Retired  Scientific laboratory technician tile   Leisure n/a   Cognition   Overall Cognitive Status Within Functional Limits for tasks assessed   Observation/Other Assessments   Focus on Therapeutic Outcomes (FOTO)  42  58% limited, 45% predicted   AROM   Right Knee Extension 7  5 passive   Right Knee Flexion 87  93 passive   Strength   Right Hip Flexion 4/5   Right Hip ABduction 3/5   Left Hip Flexion 4/5    Left Hip ABduction 3/5   Right Knee Flexion 3+/5  4/5 lt   Right Knee Extension 4/5  5/5 lt   Special Tests    Special Tests Swelling Tests   Swelling Tests other   other   Comments circumferential measurements: 4.5 cm proximal to superior pole of patella: R 53.5 cm; L 50.3 cm; at superior pole: R 50.8 cm; L 48 cm   Ambulation/Gait   Ambulation/Gait Yes   Ambulation/Gait Assistance 5: Supervision   Ambulation Distance (Feet) 200 Feet   Assistive device Straight cane   Gait Pattern Decreased stance time - right;Right circumduction;Right hip hike;Decreased weight shift to right;Antalgic   Gait velocity 0.89 m/sec   Stairs Yes   Stairs Assistance 4: Min assist   Stair Management Technique With cane;Step to pattern   Number of Stairs 6   Height of Stairs 4            PT Education - 12/25/13 1408    Education provided No            PT Long Term Goals - 12/25/13 1508    PT LONG TERM GOAL #1   Title independent with advanced HEP   Time 6   Period Weeks   Status New   PT LONG TERM GOAL #2   Title improve R knee AROM 4-100 for improved ROM and functional mobility   Time 6   Period Weeks   Status New   PT LONG TERM GOAL #3   Title negotiate single 4 in step with LRAD modified independent for improved mobility   Time 6   Period Weeks   Status New   PT LONG TERM GOAL #4   Title improve gait velocity to > 1.0 m/sec for improved mobility.   Time 6   Period Weeks   Status New          Plan - 12/25/13 1409    Clinical Impression Statement Pt presents to [facility] for above stated deficits.  Will benefit from PT to maximize functional mobility and decrease pain.   Pt will benefit from skilled therapeutic intervention in order to improve on the following deficits Abnormal gait;Decreased range of motion;Difficulty walking;Impaired flexibility;Pain;Impaired perceived functional ability;Decreased strength;Decreased mobility;Decreased balance;Decreased scar mobility;Decreased  activity tolerance;Decreased endurance;Increased edema   Rehab Potential Good   PT Frequency 3x / week  PT Duration 6 weeks   PT Treatment/Interventions ADLs/Self Care Home Management;Moist Heat;Therapeutic activities;Patient/family education;Scar mobilization;Passive range of motion;Therapeutic exercise;DME Instruction;Ultrasound;Gait training;Balance training;Manual techniques;Neuromuscular re-education;Stair training;Cryotherapy;Electrical Stimulation;Functional mobility training   PT Next Visit Plan review HEP from [facility]; update as needed   Consulted and Agree with Plan of Care Patient          G-Codes - Jan 02, 2014 1511    Functional Assessment Tool Used FOTO 42 (58% limited)   Functional Limitation Mobility: Walking and moving around   Mobility: Walking and Moving Around Current Status (B9038) At least 40 percent but less than 60 percent impaired, limited or restricted   Mobility: Walking and Moving Around Goal Status (B3383) At least 40 percent but less than 60 percent impaired, limited or restricted      Problem List Patient Active Problem List   Diagnosis Date Noted  . 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

## 2013-12-28 ENCOUNTER — Ambulatory Visit: Payer: Medicare HMO | Admitting: Physical Therapy

## 2013-12-28 DIAGNOSIS — M25661 Stiffness of right knee, not elsewhere classified: Secondary | ICD-10-CM

## 2013-12-28 DIAGNOSIS — Z96651 Presence of right artificial knee joint: Secondary | ICD-10-CM

## 2013-12-28 DIAGNOSIS — M25561 Pain in right knee: Secondary | ICD-10-CM

## 2013-12-28 NOTE — Therapy (Signed)
Physical Therapy Treatment  Patient Details  Name: Samuel [name] MRN: [id number] Date of Birth: 07-29-1945  Encounter Date: 12/28/2013      PT End of Session - 12/28/13 0940    Visit Number 2   Number of Visits 12   Date for PT Re-Evaluation 02/05/14   PT Start Time 0809   PT Stop Time 0858   PT Time Calculation (min) 49 min   Activity Tolerance Patient tolerated treatment well   Behavior During Therapy [facility] for tasks assessed/performed      Past Medical History  Diagnosis Date  . Prostate cancer   . Sleep apnea     ????   O2 DESAT  10/26/13  IN ED    . Hepatitis A     25 YRS AGO  . Blind left eye   . Diabetes mellitus without complication     ?? borderline  being checked  . Cyst in hand 10/2013    left hand, treated with antibiotic & pain medicine    Past Surgical History  Procedure Laterality Date  . Joint replacement      LT KNEE 3 YRS AGO   . Eye surgery      LEFT AYE AS CHILD   . 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 Total knee arthroplasty Right 12/04/2013    Procedure: TOTAL KNEE ARTHROPLASTY;  Surgeon: Hessie Dibble, MD;  Location: [place];  Service: Orthopedics;  Laterality: Right;    There were no vitals taken for this visit.  Visit Diagnosis:  Status post total right knee replacement  Right knee pain  Knee stiffness, right      Subjective Assessment - 12/28/13 0813    Symptoms R knee "a little sore."  Pt arrived late for PT session.   Limitations Sitting;Standing;Walking;House hold activities   How long can you sit comfortably? unlimited   How long can you stand comfortably? 5 min   How long can you walk comfortably? < 5 min   Patient Stated Goals decrease pain, improve ROM, functional mobility including walking longer distances   Currently in Pain? Yes   Pain Score 3    Pain Location Knee   Pain Orientation Right   Pain Descriptors / Indicators Aching   Pain Type Surgical pain   Pain Onset 1 to 4 weeks ago   Pain  Frequency Intermittent   Aggravating Factors  bending the knee   Pain Relieving Factors medication/rest   Multiple Pain Sites No            [facility] Adult PT Treatment/Exercise - 12/28/13 0815    Knee/Hip Exercises: Aerobic   Stationary Bike Recumbent Bike x 10 min for ROM   Knee/Hip Exercises: Standing   Heel Raises 1 set;10 reps   Heel Raises Limitations cues to decrease UE support   Hip ADduction AROM;Strengthening;Right;10 reps;1 set   Functional Squat 1 set;10 reps   Other Standing Knee Exercises hip extension RLE x 10 reps; cues to decrease forward lean   Other Standing Knee Exercises standing marching x 10 reps bil with bil UE support   Knee/Hip Exercises: Seated   Long Arc Quad AROM;Right;Strengthening;10 reps   Other Seated Knee Exercises seated RLE hip flexion x 10 reps   Knee/Hip Exercises: Supine   Quad Sets AROM;Right;10 reps;1 set   Heel Slides AAROM;10 reps;Right   Straight Leg Raises AROM;Right;10 reps  min cues for technique   Other Supine Knee Exercises hip abduction x 10 reps  RLE   Modalities   Modalities Cryotherapy   Cryotherapy   Number Minutes Cryotherapy 15 Minutes   Cryotherapy Location Knee  Right   Type of Cryotherapy Ice pack  supine elevated          PT Education - 12/28/13 0934    Education provided Yes   Education Details HEP, RICE   Person(s) Educated Patient   Methods Explanation;Demonstration;Verbal cues;Handout   Comprehension Verbalized understanding;Returned demonstration;Need further instruction;Tactile cues required;Verbal cues required              Plan - 12/28/13 0941    Clinical Impression Statement Provided additional exercises to HEP to address knee ROM and RLE strength.  Will continue to benefit from PT to maximize functional mobility.   Pt will benefit from skilled therapeutic intervention in order to improve on the following deficits Abnormal gait;Decreased range of motion;Difficulty walking;Impaired  flexibility;Pain;Impaired perceived functional ability;Decreased strength;Decreased mobility;Decreased balance;Decreased scar mobility;Decreased activity tolerance;Decreased endurance;Increased edema   Rehab Potential Good   PT Frequency 3x / week   PT Duration 6 weeks   PT Treatment/Interventions ADLs/Self Care Home Management;Moist Heat;Therapeutic activities;Patient/family education;Scar mobilization;Passive range of motion;Therapeutic exercise;DME Instruction;Ultrasound;Gait training;Balance training;Manual techniques;Neuromuscular re-education;Stair training;Cryotherapy;Electrical Stimulation;Functional mobility training   PT Next Visit Plan review updated HEP; progress ROM and strengthening   Consulted and Agree with Plan of Care Patient        Problem List Patient Active Problem List   Diagnosis Date Noted  . 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

## 2013-12-28 NOTE — Patient Instructions (Addendum)
Total Knee Replacement, Care After Refer to this sheet in the next few weeks. These instructions provide you with information on caring for yourself after your procedure. Your health care provider also may give you specific instructions. Your treatment has been planned according to the most current medical practices, but problems sometimes occur. Call your health care provider if you have any problems or questions after your procedure. HOME CARE INSTRUCTIONS   See a physical therapist as directed by your health care provider.  Take medicines only as directed by your health care provider.  Avoid lifting or driving until you are instructed otherwise.  If you have been sent home with a continuous passive motion machine, use it as directed by your health care provider. SEEK MEDICAL CARE IF:  You have difficulty breathing.  You have drainage, redness, swelling, or pain at your incision site.  You have a bad smell coming from your incision site.  You have persistent bleeding from your incision site.  Your incision breaks open after sutures (stitches) or staples have been removed.  You have a fever. SEEK IMMEDIATE MEDICAL CARE IF:   You have a rash.  You have pain or swelling in your calf or thigh.  You have shortness of breath or chest pain.  Your range of motion in your knee is decreasing rather than increasing. MAKE SURE YOU:   Understand these instructions.  Will watch your condition.  Will get help right away if you are not doing well or get worse. Document Released: 08/21/2004 Document Revised: 06/18/2013 Document Reviewed: 03/23/2011 [facility] Patient Information 2015 [place], [place]. This information is not intended to replace advice given to you by your health care provider. Make sure you discuss any questions you have with your health care provider.  Cryotherapy Cryotherapy means treatment with cold. Ice or gel packs can be used to reduce both pain and swelling. Ice is  the most helpful within the first 24 to 48 hours after an injury or flare-up from overusing a muscle or joint. Sprains, strains, spasms, burning pain, shooting pain, and aches can all be eased with ice. Ice can also be used when recovering from surgery. Ice is effective, has very few side effects, and is safe for most people to use. PRECAUTIONS  Ice is not a safe treatment option for people with:  Raynaud phenomenon. This is a condition affecting small blood vessels in the extremities. Exposure to cold may cause your problems to return.  Cold hypersensitivity. There are many forms of cold hypersensitivity, including:  Cold urticaria. Red, itchy hives appear on the skin when the tissues begin to warm after being iced.  Cold erythema. This is a red, itchy rash caused by exposure to cold.  Cold hemoglobinuria. Red blood cells break down when the tissues begin to warm after being iced. The hemoglobin that carry oxygen are passed into the urine because they cannot combine with blood proteins fast enough.  Numbness or altered sensitivity in the area being iced. If you have any of the following conditions, do not use ice until you have discussed cryotherapy with your caregiver:  Heart conditions, such as arrhythmia, angina, or chronic heart disease.  High blood pressure.  Healing wounds or open skin in the area being iced.  Current infections.  Rheumatoid arthritis.  Poor circulation.  Diabetes. Ice slows the blood flow in the region it is applied. This is beneficial when trying to stop inflamed tissues from spreading irritating chemicals to surrounding tissues. However, if you expose your  skin to cold temperatures for too long or without the proper protection, you can damage your skin or nerves. Watch for signs of skin damage due to cold. HOME CARE INSTRUCTIONS Follow these tips to use ice and cold packs safely.  Place a dry or damp towel between the ice and skin. A damp towel will cool  the skin more quickly, so you may need to shorten the time that the ice is used.  For a more rapid response, add gentle compression to the ice.  Ice for no more than 20 minutes at a time. The bonier the area you are icing, the less time it will take to get the benefits of ice.  Check your skin after 5 minutes to make sure there are no signs of a poor response to cold or skin damage.  Rest 20 minutes or more between uses.  Once your skin is numb, you can end your treatment. You can test numbness by very lightly touching your skin. The touch should be so light that you do not see the skin dimple from the pressure of your fingertip. When using ice, most people will feel these normal sensations in this order: cold, burning, aching, and numbness.  Do not use ice on someone who cannot communicate their responses to pain, such as small children or people with dementia. HOW TO MAKE AN ICE PACK Ice packs are the most common way to use ice therapy. Other methods include ice massage, ice baths, and cryosprays. Muscle creams that cause a cold, tingly feeling do not offer the same benefits that ice offers and should not be used as a substitute unless recommended by your caregiver. To make an ice pack, do one of the following:  Place crushed ice or a bag of frozen vegetables in a sealable plastic bag. Squeeze out the excess air. Place this bag inside another plastic bag. Slide the bag into a pillowcase or place a damp towel between your skin and the bag.  Mix 3 parts water with 1 part rubbing alcohol. Freeze the mixture in a sealable plastic bag. When you remove the mixture from the freezer, it will be slushy. Squeeze out the excess air. Place this bag inside another plastic bag. Slide the bag into a pillowcase or place a damp towel between your skin and the bag. SEEK MEDICAL CARE IF:  You develop white spots on your skin. This may give the skin a blotchy (mottled) appearance.  Your skin turns blue or  pale.  Your skin becomes waxy or hard.  Your swelling gets worse. MAKE SURE YOU:   Understand these instructions.  Will watch your condition.  Will get help right away if you are not doing well or get worse. Document Released: 09/28/2010 Document Revised: 06/18/2013 Document Reviewed: 09/28/2010 [facility] Patient Information 2015 [place], [place]. This information is not intended to replace advice given to you by your health care provider. Make sure you discuss any questions you have with your health care provider.  POSITIONING :  Elevate leg above heart, with knee resting into extension.  Place pillow under ankle, never under knee.      Heel Slide  Bend right knee and pull heel toward buttocks. Use sheet, strap, or rope to actively assist knee further into flexion. Hold stretch 10 secs.  Repeat _10___ times. Do _2-3___ sessions per day.  KNEE: Extension, Long Arc Quads - Sitting   Raise leg until knee is straight. _10__ reps per set, _1__ sets per day, _2-3__ days  per week  FLEXION: Sitting (Active)  Sit, both feet flat. Lift right knee toward ceiling.  Complete _1__ sets of _10__ repetitions. Perform _2-3__ sessions per day.  Copyright  VHI. All rights reserved.    Pt also performed home program from [facility].  Recommended pt continue with current HEP and add above exercises.

## 2014-01-08 ENCOUNTER — Ambulatory Visit: Payer: Medicare HMO

## 2014-01-09 ENCOUNTER — Ambulatory Visit: Payer: Medicare HMO | Admitting: Physical Therapy

## 2014-01-09 DIAGNOSIS — M25561 Pain in right knee: Secondary | ICD-10-CM

## 2014-01-09 DIAGNOSIS — M25661 Stiffness of right knee, not elsewhere classified: Secondary | ICD-10-CM

## 2014-01-09 DIAGNOSIS — Z96651 Presence of right artificial knee joint: Secondary | ICD-10-CM | POA: Diagnosis not present

## 2014-01-09 NOTE — Therapy (Signed)
Physical Therapy Treatment  Patient Details  Name: Samuel [name] MRN: [id number] Date of Birth: 01/23/1946  Encounter Date: 01/09/2014      PT End of Session - 01/09/14 1457    Visit Number 3   Number of Visits 12   Date for PT Re-Evaluation 02/05/14   PT Start Time 1430   PT Stop Time 1500   PT Time Calculation (min) 30 min   Activity Tolerance Patient tolerated treatment well   Behavior During Therapy WFL for tasks assessed/performed      Past Medical History  Diagnosis Date  . Prostate cancer   . Sleep apnea     ????   O2 DESAT  10/26/13  IN ED    . Hepatitis A     25 YRS AGO  . Blind left eye   . Diabetes mellitus without complication     ?? borderline  being checked  . Cyst in hand 10/2013    left hand, treated with antibiotic & pain medicine    Past Surgical History  Procedure Laterality Date  . Joint replacement      LT KNEE 3 YRS AGO   . Eye surgery      LEFT AYE AS CHILD   . Total knee arthroplasty Right 12/04/2013    dr dalldorf  . Total knee arthroplasty Right 12/04/2013    Procedure: TOTAL KNEE ARTHROPLASTY;  Surgeon: Peter G Dalldorf, MD;  Location: [facility];  Service: Orthopedics;  Laterality: Right;    There were no vitals taken for this visit.  Visit Diagnosis:  Status post total right knee replacement  Right knee pain  Knee stiffness, right      Subjective Assessment - 01/09/14 1434    Symptoms R knee feeling better; still painful at night.  Pt arrived 15 min late for PT session.     Limitations Sitting;Standing;Walking;House hold activities   How long can you sit comfortably? unlimited   How long can you stand comfortably? 10 min   How long can you walk comfortably? 10 min   Patient Stated Goals decrease pain, improve ROM, functional mobility including walking longer distances   Currently in Pain? Yes   Pain Score 3    Pain Location Knee   Pain Orientation Right   Pain Descriptors / Indicators Aching   Pain Type Surgical pain   Pain  Onset 1 to 4 weeks ago   Pain Frequency Intermittent   Aggravating Factors  flexion, ROM   Pain Relieving Factors medication/rest          [facility] PT Assessment - 01/09/14 1447    AROM   Right Knee Extension 0   Right Knee Flexion 95          [facility] Adult PT Treatment/Exercise - 01/09/14 1436    Knee/Hip Exercises: Aerobic   Stationary Bike recumbent bike x 8 min for ROM   Knee/Hip Exercises: Supine   Quad Sets AROM;Right;10 reps;1 set  with terminal knee extension   Short Arc Quad Sets AROM;Strengthening;Right;10 reps   Short Arc Quad Sets Limitations 2#   Heel Slides AAROM;10 reps;Right  with strap   Straight Leg Raise with External Rotation AROM;Strengthening;Right;10 reps   Straight Leg Raise with External Rotation Limitations 2#          PT Education - 01/09/14 1457    Education provided No            PT Long Term Goals - 01/09/14 1459    PT LONG TERM GOAL #  1   Title independent with advanced HEP   Time 6   Period Weeks   Status On-going   PT LONG TERM GOAL #2   Title improve R knee AROM 4-100 for improved ROM and functional mobility   Time 6   Period Weeks   Status Partially Met   PT LONG TERM GOAL #3   Title negotiate single 4 in step with LRAD modified independent for improved mobility   Time 6   Period Weeks   Status On-going   PT LONG TERM GOAL #4   Title improve gait velocity to > 1.0 m/sec for improved mobility.   Time 6   Period Weeks   Status On-going          Plan - 01/09/14 1457    Clinical Impression Statement Pt's R knee ROM improving.  Pt progressing well towards goals.  Will continue to benefit from PT to improve ROM and strength and improve functional mobility.   Pt will benefit from skilled therapeutic intervention in order to improve on the following deficits Abnormal gait;Decreased range of motion;Difficulty walking;Impaired flexibility;Pain;Impaired perceived functional ability;Decreased strength;Decreased mobility;Decreased  balance;Decreased scar mobility;Decreased activity tolerance;Decreased endurance;Increased edema   Rehab Potential Good   PT Frequency 3x / week   PT Duration 6 weeks   PT Treatment/Interventions ADLs/Self Care Home Management;Moist Heat;Therapeutic activities;Patient/family education;Scar mobilization;Passive range of motion;Therapeutic exercise;DME Instruction;Ultrasound;Gait training;Balance training;Manual techniques;Neuromuscular re-education;Stair training;Cryotherapy;Electrical Stimulation;Functional mobility training   PT Next Visit Plan progress flexion, strengthening   Consulted and Agree with Plan of Care Patient        Problem List Patient Active Problem List   Diagnosis Date Noted  . 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 [phone] (office) [phone] (fax)      

## 2014-01-14 ENCOUNTER — Ambulatory Visit: Payer: Medicare HMO | Admitting: Physical Therapy

## 2014-01-14 DIAGNOSIS — Z96651 Presence of right artificial knee joint: Secondary | ICD-10-CM

## 2014-01-14 DIAGNOSIS — M25661 Stiffness of right knee, not elsewhere classified: Secondary | ICD-10-CM

## 2014-01-14 DIAGNOSIS — M25561 Pain in right knee: Secondary | ICD-10-CM

## 2014-01-14 NOTE — Therapy (Signed)
Physical Therapy Treatment  Patient Details  Name: Samuel [name] MRN: [id number] Date of Birth: 03/22/1945  Encounter Date: 01/14/2014      PT End of Session - 01/14/14 1359    Visit Number 4   Number of Visits 12   Date for PT Re-Evaluation 02/05/14   PT Start Time 1330   PT Stop Time 1423   PT Time Calculation (min) 53 min   Activity Tolerance Patient tolerated treatment well      Past Medical History  Diagnosis Date  . Prostate cancer   . Sleep apnea     ????   O2 DESAT  10/26/13  IN ED    . Hepatitis A     25 YRS AGO  . Blind left eye   . Diabetes mellitus without complication     ?? borderline  being checked  . Cyst in hand 10/2013    left hand, treated with antibiotic & pain medicine    Past Surgical History  Procedure Laterality Date  . Joint replacement      LT KNEE 3 YRS AGO   . Eye surgery      LEFT AYE AS CHILD   . 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 Total knee arthroplasty Right 12/04/2013    Procedure: TOTAL KNEE ARTHROPLASTY;  Surgeon: Hessie Dibble, MD;  Location: [place];  Service: Orthopedics;  Laterality: Right;    There were no vitals taken for this visit.  Visit Diagnosis:  Status post total right knee replacement  Right knee pain  Knee stiffness, right      Subjective Assessment - 01/14/14 1335    Symptoms Pt. complains of mild knee pain today, feels like he is doing as well as could be expected.    Pertinent History Lt. TKR   Limitations Sitting;Standing;Walking;House hold activities   How long can you sit comfortably? unlimited   How long can you stand comfortably? 10 min   How long can you walk comfortably? 10 min   Patient Stated Goals decrease pain, improve ROM, functional mobility including walking longer distances   Currently in Pain? Yes   Pain Score 3    Pain Location Knee   Pain Orientation Right   Pain Descriptors / Indicators Aching   Pain Type Surgical pain   Pain Onset More than a month ago   Pain Frequency Intermittent   Multiple Pain Sites No          [facility] PT Assessment - 01/14/14 1342    Strength   Right Knee Flexion --  4+/5   Right Knee Extension --  4+/5     AAROM Rt. Knee 0-4-100 deg.   AROM in flexion to 97 deg.      [facility] Adult PT Treatment/Exercise - 01/14/14 1347    Knee/Hip Exercises: Aerobic   Stationary Bike --  NuStep level 4 UE and LE for 5 min   Knee/Hip Exercises: Machines for Strengthening   Cybex Leg Press --  1 plate x 20 , 2 sets   Knee/Hip Exercises: Standing   Other Standing Knee Exercises lunge x5 x10 sec for AAROM   Other Standing Knee Exercises calf stretch off step 3 x 30 sec   Knee/Hip Exercises: Supine   Heel Slides AAROM;Right;5 reps   Bridges Strengthening;1 set;10 reps   Straight Leg Raises Strengthening;Right;1 set;10 reps  2 sets   Other Supine Knee Exercises --  Ball for knee flex and bridging x10 each  Other Supine Knee Exercises --  hip abd x10 Rt/Lt. 2 sets   Cryotherapy   Number Minutes Cryotherapy 15 Minutes   Cryotherapy Location Knee   Type of Cryotherapy Other (comment)  Game Ready          PT Education - 01/14/14 1358    Education provided Yes   Education Details general HEP ex, AROM   Person(s) Educated Patient   Methods Explanation;Demonstration   Comprehension Returned demonstration            PT Long Term Goals - 01/14/14 1359    PT LONG TERM GOAL #1   Title independent with advanced HEP   Status On-going   PT LONG TERM GOAL #2   Title improve R knee AROM 4-100 for improved ROM and functional mobility   Status Partially Met  met with AAROM   PT LONG TERM GOAL #3   Title negotiate single 4 in step with LRAD modified independent for improved mobility   Status On-going   PT LONG TERM GOAL #4   Title improve gait velocity to > 1.0 m/sec for improved mobility.   Status On-going          Plan - 01/14/14 1408    Clinical Impression Statement Pt. is able to achieve ROM goals but needs  AAROM with sheet or therapist.  He will continue to benefit from skilled PT to improve gait and activity tolerance.  Has several ex at home which he does daily.     PT Next Visit Plan add more closed chain, steps and hip abd   Consulted and Agree with Plan of Care Patient        Problem List Patient Active Problem List   Diagnosis Date Noted  . 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

## 2014-01-16 ENCOUNTER — Ambulatory Visit: Payer: Medicare HMO | Attending: Orthopaedic Surgery | Admitting: Physical Therapy

## 2014-01-16 DIAGNOSIS — Z96651 Presence of right artificial knee joint: Secondary | ICD-10-CM

## 2014-01-16 DIAGNOSIS — M25661 Stiffness of right knee, not elsewhere classified: Secondary | ICD-10-CM | POA: Insufficient documentation

## 2014-01-16 DIAGNOSIS — M25561 Pain in right knee: Secondary | ICD-10-CM

## 2014-01-16 NOTE — Patient Instructions (Signed)
Remove taping if irritating Mr. May leave on up to 5 days.  Repair tape as it rolls.

## 2014-01-16 NOTE — Therapy (Signed)
Outpatient Rehabilitation [facility] [address] [place], [place], [address] Phone: [phone]   Fax:  [phone]  Physical Therapy Treatment  Patient Details  Name: Samuel [name] MRN: [id number] Date of Birth: 12-22-1945  Encounter Date: 01/16/2014      PT End of Session - 01/16/14 1513    Visit Number 5   Number of Visits 12   Date for PT Re-Evaluation 02/05/14   PT Start Time 1962   PT Stop Time 1515   PT Time Calculation (min) 59 min   Activity Tolerance Patient tolerated treatment well   Behavior During Therapy [facility] for tasks assessed/performed      Past Medical History  Diagnosis Date  . Prostate cancer   . Sleep apnea     ????   O2 DESAT  10/26/13  IN ED    . Hepatitis A     25 YRS AGO  . Blind left eye   . Diabetes mellitus without complication     ?? borderline  being checked  . Cyst in hand 10/2013    left hand, treated with antibiotic & pain medicine    Past Surgical History  Procedure Laterality Date  . Joint replacement      LT KNEE 3 YRS AGO   . Eye surgery      LEFT AYE AS CHILD   . 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 Total knee arthroplasty Right 12/04/2013    Procedure: TOTAL KNEE ARTHROPLASTY;  Surgeon: Hessie Dibble, MD;  Location: [place];  Service: Orthopedics;  Laterality: Right;    There were no vitals taken for this visit.  Visit Diagnosis:  Status post total right knee replacement  Knee stiffness, right  Right knee pain      Subjective Assessment - 01/16/14 1421    Symptoms No painnow, last night while trying to sleep,  Staying in one spot at night.  Lateral.  Doing exercises uses ice after exercise.  Can walk 10 minutes.  Uses cane in community only.   Currently in Pain? No/denies   Pain Location Knee   Pain Orientation Right;Lateral   Pain Type Surgical pain   Pain Onset More than a month ago   Pain Frequency Intermittent   Aggravating Factors  Staying in one position, laying a certain  way.   Pain Relieving Factors change of position, sit up.  Pain meds uses ice sometimes.   Multiple Pain Sites Yes   Pain Score 0   Pain Location Hand   Pain Radiating Towards all fingers except pinkie    Pain Descriptors / Indicators Numbness   Pain Frequency Constant   Pain Onset On-going            [facility] Adult PT Treatment/Exercise - 01/16/14 1431    Ambulation/Gait   Ambulation/Gait Yes   Ambulation/Gait Assistance --  Uses 2 raiils can do step over step   Stair Management Technique Two rails;Alternating pattern   Number of Stairs 4   Height of Stairs 6   Knee/Hip Exercises: Aerobic   Stationary Bike Nustep 6 minmutes level5.   Knee/Hip Exercises: Machines for Strengthening   Total Gym Leg Press 2 legs 1, 2 plates 10 reps each   Knee/Hip Exercises: Standing   Lateral Step Up 1 set;Right;10 reps   Lateral Step Up Limitations 4 inches   Forward Step Up 1 set;10 reps   Forward Step Up Limitations 6 inches  Heavy use of hands   Functional Squat  10 reps  Hinge   Knee/Hip Exercises: Supine   Quad Sets 1 set;Right;Strengthening   Heel Slides 1 set;Right;AROM   Cryotherapy   Number Minutes Cryotherapy 15 Minutes   Cryotherapy Location Knee  elevated   Type of Cryotherapy --  Vasopneumatic.  Moderate pressure 32 degrees   Manual Therapy   Manual Therapy Manual Lymphatic Drainage (MLD)  anterior thigh, knee, posterior knee   Edema Management --  Kinesiotex taping , muscle activation, edema management   Joint Mobilization Strap mobilization for flexion          PT Education - 01/16/14 1513    Education provided Yes   Education Details edems control.   Person(s) Educated Patient   Methods Explanation;Demonstration   Comprehension Verbalized understanding            PT Long Term Goals - 01/16/14 1518    PT LONG TERM GOAL #2   Title improve R knee AROM 4-100 for improved ROM and functional mobility   Time 6   Period Weeks   Status Partially Met   PT  LONG TERM GOAL #3   Title negotiate single 4 in step with LRAD modified independent for improved mobility   Time 6   Period Weeks   Status On-going   PT LONG TERM GOAL #4   Title improve gait velocity to > 1.0 m/sec for improved mobility.   Time 6   Period Weeks   Status On-going          Plan - 01/16/14 1515    Clinical Impression Statement --  4/5 pain lateral knee after exercise.  After cold 3/10   PT Next Visit Plan stand with towel slide, step ups, wall sits .  Prone knee flexion.                               Problem List Patient Active Problem List   Diagnosis Date Noted  . 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

## 2014-01-21 ENCOUNTER — Ambulatory Visit: Payer: Medicare HMO

## 2014-01-21 DIAGNOSIS — Z96651 Presence of right artificial knee joint: Secondary | ICD-10-CM | POA: Diagnosis not present

## 2014-01-21 NOTE — Patient Instructions (Addendum)
ABDUCTION: Side-Lying (Active)   Lie on left side, top leg straight. Raise top leg as far as possible. Complete _3 sets of _10__ repetitions. Perform _2_ sessions per day. Repeat on other side.  http://gtsc.exer.us/94   Copyright  VHI. All rights reserved.

## 2014-01-21 NOTE — Therapy (Signed)
Outpatient Rehabilitation [facility] [address] [place], [place], [address] Phone: [phone]   Fax:  [phone]  Physical Therapy Treatment  Patient Details  Name: Samuel [name] MRN: [id number] Date of Birth: June 03, 1945  Encounter Date: 01/21/2014      PT End of Session - 01/21/14 1758    Visit Number 6   Number of Visits 12   Date for PT Re-Evaluation 02/05/14   PT Start Time 1344   PT Stop Time 1425   PT Time Calculation (min) 41 min   Activity Tolerance Patient tolerated treatment well   Behavior During Therapy [facility] for tasks assessed/performed      Past Medical History  Diagnosis Date  . Prostate cancer   . Sleep apnea     ????   O2 DESAT  10/26/13  IN ED    . Hepatitis A     25 YRS AGO  . Blind left eye   . Diabetes mellitus without complication     ?? borderline  being checked  . Cyst in hand 10/2013    left hand, treated with antibiotic & pain medicine    Past Surgical History  Procedure Laterality Date  . Joint replacement      LT KNEE 3 YRS AGO   . Eye surgery      LEFT AYE AS CHILD   . 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 Total knee arthroplasty Right 12/04/2013    Procedure: TOTAL KNEE ARTHROPLASTY;  Surgeon: Hessie Dibble, MD;  Location: [place];  Service: Orthopedics;  Laterality: Right;    There were no vitals taken for this visit.  Visit Diagnosis:  Status post total right knee replacement      Subjective Assessment - 01/21/14 1631    Symptoms Pain rates 2/10 currently. Pain increased to 5/10 with step ups. Pt was pleased that he could make full revolution with recumbent bike today after warming up.    Pertinent History Lt. TKR   Limitations Sitting;Standing;Walking;House hold activities   How long can you sit comfortably? unlimited   How long can you stand comfortably? 10 min   How long can you walk comfortably? 10 min   Patient Stated Goals decrease pain, improve ROM, functional mobility including  walking longer distances   Currently in Pain? Yes   Pain Score 2    Pain Location Knee   Pain Orientation Right   Pain Descriptors / Indicators Aching   Pain Type Surgical pain   Pain Onset More than a month ago            [facility] Adult PT Treatment/Exercise - 01/21/14 1633    Ambulation/Gait   Ambulation/Gait Yes   Ambulation/Gait Assistance 6: Modified independent (Device/Increase time)   Ambulation Distance (Feet) 100 Feet   Assistive device Straight cane   Gait Pattern Lateral trunk lean to right  Lists to R as a result of weak hip ABductors   Stairs Yes   Stair Management Technique Two rails;Alternating pattern   Number of Stairs --  4-6 non reciprocal   Height of Stairs 6  Instructed pt to continue with non-reciprocal on stairs   Knee/Hip Exercises: Aerobic   Stationary Bike recumbent bike  5 mins, able to make a full revolution today.    Knee/Hip Exercises: Machines for Strengthening   Total Gym Leg Press --   Knee/Hip Exercises: Standing   Lateral Step Up Right;1 set;5 reps  poor control and increased pain, so discontinued.  Lateral Step Up Limitations 4 inches   Forward Step Up 1 set;10 reps  poor control   Forward Step Up Limitations 4  Heavy use of hands   Functional Squat --   Knee/Hip Exercises: Supine   Quad Sets --   Heel Slides AROM;Right;1 set   Other Supine Knee Exercises Sidelying hip ABD 10 x 3 each added to HEP   Cryotherapy   Number Minutes Cryotherapy 10 Minutes   Cryotherapy Location Knee  elevated   Manual Therapy   Manual Therapy Manual Lymphatic Drainage (MLD)  anterior thigh, knee, posterior knee          PT Education - 01/21/14 1758    Education provided Yes   Education Details COntinue HEP              Plan - 01/21/14 1759    Clinical Impression Statement R knee AAROM flexion measures 0-105 today. Good improvements in AAROM flexion noted. Pt conitnues to be weak with closed chain ther ex. List to R during gait  indicated R abductor weakness and therefore added strengthening to HEP.    Pt will benefit from skilled therapeutic intervention in order to improve on the following deficits Abnormal gait;Decreased range of motion;Difficulty walking;Impaired flexibility;Pain;Impaired perceived functional ability;Decreased strength;Decreased mobility;Decreased balance;Decreased scar mobility;Decreased activity tolerance;Decreased endurance;Increased edema   Rehab Potential Good   PT Frequency 3x / week   PT Duration 6 weeks   PT Treatment/Interventions ADLs/Self Care Home Management;Moist Heat;Therapeutic activities;Patient/family education;Scar mobilization;Passive range of motion;Therapeutic exercise;DME Instruction;Ultrasound;Gait training;Balance training;Manual techniques;Neuromuscular re-education;Stair training;Cryotherapy;Electrical Stimulation;Functional mobility training   Consulted and Agree with Plan of Care Patient                               Problem List Patient Active Problem List   Diagnosis Date Noted  . 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

## 2014-01-21 NOTE — Therapy (Signed)
Outpatient Rehabilitation [facility] [address] [place], [place], [address] Phone: [phone]   Fax:  [phone]  Physical Therapy Treatment  Patient Details  Name: Samuel [name] MRN: [id number] Date of Birth: 04/26/1945  Encounter Date: 01/21/2014      PT End of Session - 01/21/14 1758    Visit Number 6   Number of Visits 12   Date for PT Re-Evaluation 02/05/14   PT Start Time 1344   PT Stop Time 1425   PT Time Calculation (min) 41 min   Activity Tolerance Patient tolerated treatment well   Behavior During Therapy [facility] for tasks assessed/performed      Past Medical History  Diagnosis Date  . Prostate cancer   . Sleep apnea     ????   O2 DESAT  10/26/13  IN ED    . Hepatitis A     25 YRS AGO  . Blind left eye   . Diabetes mellitus without complication     ?? borderline  being checked  . Cyst in hand 10/2013    left hand, treated with antibiotic & pain medicine    Past Surgical History  Procedure Laterality Date  . Joint replacement      LT KNEE 3 YRS AGO   . Eye surgery      LEFT AYE AS CHILD   . 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 Total knee arthroplasty Right 12/04/2013    Procedure: TOTAL KNEE ARTHROPLASTY;  Surgeon: Hessie Dibble, MD;  Location: [place];  Service: Orthopedics;  Laterality: Right;    There were no vitals taken for this visit.  Visit Diagnosis:  Status post total right knee replacement      Subjective Assessment - 01/21/14 1631    Symptoms Pain rates 2/10 currently. Pain increased to 5/10 with step ups. Pt was pleased that he could make full revolution with recumbent bike today after warming up.    Pertinent History Lt. TKR   Limitations Sitting;Standing;Walking;House hold activities   How long can you sit comfortably? unlimited   How long can you stand comfortably? 10 min   How long can you walk comfortably? 10 min   Patient Stated Goals decrease pain, improve ROM, functional mobility including  walking longer distances   Currently in Pain? Yes   Pain Score 2    Pain Location Knee   Pain Orientation Right   Pain Descriptors / Indicators Aching   Pain Type Surgical pain   Pain Onset More than a month ago            [facility] Adult PT Treatment/Exercise - 01/21/14 1633    Ambulation/Gait   Ambulation/Gait Yes   Ambulation/Gait Assistance 6: Modified independent (Device/Increase time)   Ambulation Distance (Feet) 100 Feet   Assistive device Straight cane   Gait Pattern Lateral trunk lean to right  Lists to R as a result of weak hip ABductors   Stairs Yes   Stair Management Technique Two rails;Alternating pattern   Number of Stairs --  4-6 non reciprocal   Height of Stairs 6  Instructed pt to continue with non-reciprocal on stairs   Knee/Hip Exercises: Aerobic   Stationary Bike recumbent bike  5 mins, able to make a full revolution today.    Knee/Hip Exercises: Machines for Strengthening   Total Gym Leg Press --   Knee/Hip Exercises: Standing   Lateral Step Up Right;1 set;5 reps  poor control and increased pain, so discontinued.  Lateral Step Up Limitations 4 inches   Forward Step Up 1 set;10 reps  poor control   Forward Step Up Limitations 4  Heavy use of hands   Functional Squat --   Knee/Hip Exercises: Supine   Quad Sets --   Heel Slides AROM;Right;1 set   Other Supine Knee Exercises Sidelying hip ABD 10 x 3 each added to HEP   Cryotherapy   Number Minutes Cryotherapy 10 Minutes   Cryotherapy Location Knee  elevated   Manual Therapy   Manual Therapy Manual Lymphatic Drainage (MLD)  anterior thigh, knee, posterior knee          PT Education - 01/21/14 1758    Education provided Yes   Education Details COntinue HEP              Plan - 01/21/14 1759    Clinical Impression Statement R knee AAROM flexion measures 0-105 today. Good improvements in AAROM flexion noted. Pt conitnues to be weak with closed chain ther ex. List to R during gait  indicated R abductor weakness and therefore added strengthening to HEP.    Pt will benefit from skilled therapeutic intervention in order to improve on the following deficits Abnormal gait;Decreased range of motion;Difficulty walking;Impaired flexibility;Pain;Impaired perceived functional ability;Decreased strength;Decreased mobility;Decreased balance;Decreased scar mobility;Decreased activity tolerance;Decreased endurance;Increased edema   Rehab Potential Good   PT Frequency 3x / week   PT Duration 6 weeks   PT Treatment/Interventions ADLs/Self Care Home Management;Moist Heat;Therapeutic activities;Patient/family education;Scar mobilization;Passive range of motion;Therapeutic exercise;DME Instruction;Ultrasound;Gait training;Balance training;Manual techniques;Neuromuscular re-education;Stair training;Cryotherapy;Electrical Stimulation;Functional mobility training   Consulted and Agree with Plan of Care Patient                               Problem List Patient Active Problem List   Diagnosis Date Noted  . 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

## 2014-01-23 ENCOUNTER — Ambulatory Visit: Payer: Medicare HMO

## 2014-01-23 ENCOUNTER — Telehealth: Payer: Self-pay | Admitting: *Deleted

## 2014-01-23 DIAGNOSIS — Z96651 Presence of right artificial knee joint: Secondary | ICD-10-CM

## 2014-01-23 NOTE — Therapy (Signed)
Outpatient Rehabilitation [facility] [address] [place], [place], [address] Phone: [phone]   Fax:  [phone]  Physical Therapy Treatment  Patient Details  Name: Samuel [name] MRN: [id number] Date of Birth: 1945-05-23  Encounter Date: 01/23/2014      PT End of Session - 01/23/14 1351    Visit Number 7   Number of Visits 12   Date for PT Re-Evaluation 02/05/14   PT Start Time 1330   PT Stop Time 1415   PT Time Calculation (min) 45 min   Activity Tolerance Patient tolerated treatment well   Behavior During Therapy [facility] for tasks assessed/performed      Past Medical History  Diagnosis Date  . Prostate cancer   . Sleep apnea     ????   O2 DESAT  10/26/13  IN ED    . Hepatitis A     25 YRS AGO  . Blind left eye   . Diabetes mellitus without complication     ?? borderline  being checked  . Cyst in hand 10/2013    left hand, treated with antibiotic & pain medicine    Past Surgical History  Procedure Laterality Date  . Joint replacement      LT KNEE 3 YRS AGO   . Eye surgery      LEFT AYE AS CHILD   . 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 Total knee arthroplasty Right 12/04/2013    Procedure: TOTAL KNEE ARTHROPLASTY;  Surgeon: Hessie Dibble, MD;  Location: [place];  Service: Orthopedics;  Laterality: Right;    There were no vitals taken for this visit.  Visit Diagnosis:  Status post total right knee replacement      Subjective Assessment - 01/23/14 1338    Symptoms Pt rpeorts some hip soreness (muscular) following last visit that has since subsided, but also c/o of R lateral knee pain rating 5/10 today .    Pertinent History Lt. TKR, R TKA   Limitations Sitting;Standing;Walking;House hold activities   Currently in Pain? Yes   Pain Score 5    Pain Location Knee   Pain Orientation Right            [facility] Adult PT Treatment/Exercise - 01/23/14 1340    Ambulation/Gait   Ambulation/Gait Yes   Ambulation/Gait Assistance  6: Modified independent (Device/Increase time)   Assistive device Straight cane   Gait Pattern Lateral trunk lean to right  Lists to R as a result of weak hip ABductors   Stairs Yes   Stair Management Technique Two rails;Alternating pattern   Height of Stairs 6  Instructed pt to continue with non-reciprocal on stairs   Knee/Hip Exercises: Aerobic   Stationary Bike recumbent bike  5 mins, unable to make a full revolution today.    Knee/Hip Exercises: Standing   Lateral Step Up Right;1 set;5 reps  compensation with dropping hip instead of bending knee.    Lateral Step Up Limitations 2 inches    Forward Step Up --   Forward Step Up Limitations --   Functional Squat 2 sets;10 reps   Knee/Hip Exercises: Supine   Heel Slides AROM;AAROM;Right;1 set;10 reps   Bridges Strengthening;2 sets;10 reps   Straight Leg Raises Strengthening;2 sets;10 reps   Other Supine Knee Exercises Sidelying hip ABD 10 x 3 each added to HEP   Other Supine Knee Exercises SIdelying clam bilat 10 x 3   Cryotherapy   Cryotherapy Location --   Manual Therapy  Manual Therapy Manual Lymphatic Drainage (MLD)  anterior thigh, knee, posterior knee   Edema Management STM to R H/S quad, ITB          PT Education - 01/23/14 1351    Education provided No              Plan - 01/23/14 1400    Clinical Impression Statement Abductor weakness apparent, but progressed reps and ther ex to address with hip ABD and clams. Added functional squats with no increase in pain anmd bridging.    PT Frequency 3x / week   PT Duration 6 weeks   PT Treatment/Interventions ADLs/Self Care Home Management;Moist Heat;Therapeutic activities;Patient/family education;Scar mobilization;Passive range of motion;Therapeutic exercise;DME Instruction;Ultrasound;Gait training;Balance training;Manual techniques;Neuromuscular re-education;Stair training;Cryotherapy;Electrical Stimulation;Functional mobility training   PT Next Visit Plan stand  with towel slide, step ups, wall sits .  Prone knee flexion.                               Problem List Patient Active Problem List   Diagnosis Date Noted  . 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

## 2014-01-23 NOTE — Telephone Encounter (Signed)
APPTS MADE AND PRINT.Marland KitchenMarland KitchenTD

## 2014-01-29 ENCOUNTER — Ambulatory Visit: Payer: Medicare HMO | Admitting: Rehabilitation

## 2014-01-29 DIAGNOSIS — M25661 Stiffness of right knee, not elsewhere classified: Secondary | ICD-10-CM

## 2014-01-29 DIAGNOSIS — M25561 Pain in right knee: Secondary | ICD-10-CM

## 2014-01-29 DIAGNOSIS — Z96651 Presence of right artificial knee joint: Secondary | ICD-10-CM | POA: Diagnosis not present

## 2014-01-29 NOTE — Therapy (Signed)
[facility] [address] [place], [place], [address] Phone: [phone]   Fax:  [phone]  Physical Therapy Treatment  Patient Details  Name: Samuel [name] MRN: [id number] Date of Birth: [birth year]  Encounter Date: 01/29/2014      PT End of Session - 01/29/14 1439    Visit Number 8   Number of Visits 12   Date for PT Re-Evaluation 02/05/14   PT Start Time 0215      Past Medical History  Diagnosis Date  . Prostate cancer   . Sleep apnea     ????   O2 DESAT  10/26/13  IN ED    . Hepatitis A     25 YRS AGO  . Blind left eye   . Diabetes mellitus without complication     ?? borderline  being checked  . Cyst in hand 10/2013    left hand, treated with antibiotic & pain medicine    Past Surgical History  Procedure Laterality Date  . Joint replacement      LT KNEE 3 YRS AGO   . Eye surgery      LEFT AYE AS CHILD   . 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 Total knee arthroplasty Right 12/04/2013    Procedure: TOTAL KNEE ARTHROPLASTY;  Surgeon: Hessie Dibble, MD;  Location: [place];  Service: Orthopedics;  Laterality: Right;    There were no vitals taken for this visit.  Visit Diagnosis:  Status post total right knee replacement  Knee stiffness, right  Right knee pain      Subjective Assessment - 01/29/14 1437    Symptoms Not too bad 3-4/10pain   Currently in Pain? Yes   Pain Location Knee   Pain Orientation Right   Pain Descriptors / Indicators Sore   Aggravating Factors  hip adbuction in sidelying   Effect of Pain on Daily Activities change positions   Multiple Pain Sites No          [facility] PT Assessment - 01/29/14 1449    AROM   Right Knee Extension 0   Right Knee Flexion 103   Strength   Right Hip ABduction --  4-/5   Left Hip ABduction 4/5   Ambulation/Gait   Gait velocity 3.108f/sec   Stair Management Technique One rail Left   Number of Stairs --  4-6 recirocal, pt demonstrates  unsafe gait navigation          [facility] Adult PT Treatment/Exercise - 01/29/14 1449    Knee/Hip Exercises: Standing   Heel Raises 2 sets;10 reps   Forward Step Up Right;10 reps;2 sets;Step Height: 6"   Wall Squat 2 sets;10 reps   SLS with Vectors bil 2 finger support with flexion and abduction    Other Standing Knee Exercises toe raises x10    Other Standing Knee Exercises side stepping 1101fx 2 each   Knee/Hip Exercises: Seated   Long Arc Quad 20 reps   Knee/Hip Exercises: Sidelying   Hip ABduction 10 reps   Clams 10              PT Long Term Goals - 01/29/14 1448    PT LONG TERM GOAL #1   Title independent with advanced HEP   Time 6   Period Weeks   Status On-going   PT LONG TERM GOAL #2   Title improve R knee AROM 4-100 for improved ROM and functional mobility   Time 6   Period  Weeks   Status Achieved   PT LONG TERM GOAL #3   Title negotiate single 4 in step with LRAD modified independent for improved mobility   Time 6   Period Weeks   Status On-going   PT LONG TERM GOAL #4   Title improve gait velocity to > 1.0 m/sec for improved mobility.   Time 6   Period Weeks   Status On-going          Plan - 01/29/14 1455    Clinical Impression Statement LTG # 2 MET, AROM improved,    PT Next Visit Plan eccentric quad strength, balance, hip strength, flexion ROM                               Problem List Patient Active Problem List   Diagnosis Date Noted  . 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

## 2014-01-31 ENCOUNTER — Ambulatory Visit: Payer: Medicare HMO

## 2014-01-31 DIAGNOSIS — Z96651 Presence of right artificial knee joint: Secondary | ICD-10-CM | POA: Diagnosis not present

## 2014-01-31 NOTE — Therapy (Signed)
[facility] [address] [place], [place], [address] Phone: [phone]   Fax:  [phone]  Physical Therapy Treatment  Patient Details  Name: Samuel [name] MRN: [id number] Date of Birth: [birth year]  Encounter Date: 01/31/2014      PT End of Session - 01/31/14 1444    Visit Number 9   Number of Visits 12   Date for PT Re-Evaluation 02/05/14   PT Start Time 8756   PT Stop Time 1500   PT Time Calculation (min) 45 min      Past Medical History  Diagnosis Date  . Prostate cancer   . Sleep apnea     ????   O2 DESAT  10/26/13  IN ED    . Hepatitis A     25 YRS AGO  . Blind left eye   . Diabetes mellitus without complication     ?? borderline  being checked  . Cyst in hand 10/2013    left hand, treated with antibiotic & pain medicine    Past Surgical History  Procedure Laterality Date  . Joint replacement      LT KNEE 3 YRS AGO   . Eye surgery      LEFT AYE AS CHILD   . 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 Total knee arthroplasty Right 12/04/2013    Procedure: TOTAL KNEE ARTHROPLASTY;  Surgeon: Hessie Dibble, MD;  Location: [place];  Service: Orthopedics;  Laterality: Right;    There were no vitals taken for this visit.  Visit Diagnosis:  Status post total right knee replacement      Subjective Assessment - 01/31/14 1419    Symptoms 4/10 today and attributes it to weather.    Pertinent History Lt. TKR, R TKA   Limitations Sitting;Standing;Walking;House hold activities   How long can you stand comfortably? 5 mins   How long can you walk comfortably? 10 min   Currently in Pain? Yes   Pain Score 4    Pain Location Knee   Pain Orientation Right   Pain Descriptors / Indicators Sore   Pain Type Surgical pain   Pain Onset More than a month ago            [facility] Adult PT Treatment/Exercise - 01/31/14 1422    Exercises   Exercises Knee/Hip   Knee/Hip Exercises: Stretches   Hip Flexor Stretch 3  reps;30 seconds  supine with strap   Knee/Hip Exercises: Aerobic   Stationary Bike recumbent bike 7 minsrecumbent bike 7 minsrecumbent bike 7 mrecumbent bike 7 minsins  recumbent bike 7 mins   Knee/Hip Exercises: Standing   Heel Raises --   Lateral Step Up 20 reps;Hand Hold: 1   Lateral Step Up Limitations 2 inch   Forward Step Up Right;2 sets;10 reps;20 reps;Step Height: 2"   Forward Step Up Limitations 2 inch   Wall Squat --   SLS with Vectors --   Other Standing Knee Exercises --   Other Standing Knee Exercises --   Knee/Hip Exercises: Seated   Long Arc Quad --   Knee/Hip Exercises: Supine   Heel Slides AAROM;2 sets;10 reps   Bridges Strengthening;20 reps   Knee/Hip Exercises: Sidelying   Hip ABduction 15 reps   Clams 15   Cryotherapy   Cryotherapy Location --  pt said he will do it at home.    Manual Therapy   Manual Therapy Massage   Edema Management STM to quad and HS on R  knee                 Plan - 01/31/14 1422    Clinical Impression Statement Pt reports improved ease with hip ABD strengthening ther ex. R knee flexion improved to 104 degrees. Concentrated on R knee flexion, hip ABD strengthening, and decreasing pain.    Pt will benefit from skilled therapeutic intervention in order to improve on the following deficits Abnormal gait;Decreased range of motion;Difficulty walking;Impaired flexibility;Pain;Impaired perceived functional ability;Decreased strength;Decreased mobility;Decreased balance;Decreased scar mobility;Decreased activity tolerance;Decreased endurance;Increased edema   Rehab Potential Good   PT Treatment/Interventions ADLs/Self Care Home Management;Moist Heat;Therapeutic activities;Patient/family education;Scar mobilization;Passive range of motion;Therapeutic exercise;DME Instruction;Ultrasound;Gait training;Balance training;Manual techniques;Neuromuscular re-education;Stair training;Cryotherapy;Electrical Stimulation;Functional mobility training    PT Next Visit Plan RE- eval due 02/05/14.    Consulted and Agree with Plan of Care Patient                               Problem List Patient Active Problem List   Diagnosis Date Noted  . 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

## 2014-02-04 ENCOUNTER — Ambulatory Visit: Payer: Medicare HMO | Admitting: Physical Therapy

## 2014-02-04 DIAGNOSIS — Z96651 Presence of right artificial knee joint: Secondary | ICD-10-CM | POA: Diagnosis not present

## 2014-02-04 DIAGNOSIS — M25661 Stiffness of right knee, not elsewhere classified: Secondary | ICD-10-CM

## 2014-02-04 DIAGNOSIS — M25561 Pain in right knee: Secondary | ICD-10-CM

## 2014-02-04 NOTE — Therapy (Signed)
[place], [place], [address] Phone: [phone]   Fax:  [phone]  Physical Therapy Treatment  Patient Details  Name: Samuel [name] MRN: [id number] Date of Birth: 1945/03/03  Encounter Date: 02/04/2014      PT End of Session - 02/04/14 1454    Visit Number 10   Number of Visits 18   Date for PT Re-Evaluation 03/06/14   PT Start Time 1414   PT Stop Time 1452   PT Time Calculation (min) 38 min   Activity Tolerance Patient tolerated treatment well   Behavior During Therapy [facility] for tasks assessed/performed      Past Medical History  Diagnosis Date  . Prostate cancer   . Sleep apnea     ????   O2 DESAT  10/26/13  IN ED    . Hepatitis A     25 YRS AGO  . Blind left eye   . Diabetes mellitus without complication     ?? borderline  being checked  . Cyst in hand 10/2013    left hand, treated with antibiotic & pain medicine    Past Surgical History  Procedure Laterality Date  . Joint replacement      LT KNEE 3 YRS AGO   . Eye surgery      LEFT AYE AS CHILD   . 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 Total knee arthroplasty Right 12/04/2013    Procedure: TOTAL KNEE ARTHROPLASTY;  Surgeon: Hessie Dibble, MD;  Location: [place];  Service: Orthopedics;  Laterality: Right;    There were no vitals taken for this visit.  Visit Diagnosis:  Status post total right knee replacement - Plan: PT plan of care cert/re-cert  Knee stiffness, right - Plan: PT plan of care cert/re-cert  Right knee pain - Plan: PT plan of care cert/re-cert      Subjective Assessment - 02/04/14 1417    Symptoms Knee feels "pretty good." Reports a little pain in the knee but much improved.   Pertinent History Lt. TKR, R TKA   Limitations Sitting;Standing;Walking;House hold activities   How long can you sit comfortably? unlimited   How long can you stand comfortably? 5 mins   How long can you walk  comfortably? 10/11 min   Patient Stated Goals decrease pain, improve ROM, functional mobility including walking longer distances   Currently in Pain? Yes   Pain Score 3    Pain Location Knee   Pain Orientation Right   Pain Descriptors / Indicators Sore   Pain Type Surgical pain   Pain Onset More than a month ago   Pain Frequency Intermittent   Pain Relieving Factors position changes, pain meds, ice   Multiple Pain Sites No          [facility] PT Assessment - 02/04/14 1419    AROM   Right Knee Extension 0   Right Knee Flexion 104   Ambulation/Gait   Gait velocity 3.38 ft/sec; 1.03 m/sec  9.71 sec (10 m); without cane                  [facility] Adult PT Treatment/Exercise - 02/04/14 1419    Knee/Hip Exercises: Aerobic   Stationary Bike rec bike x 8 min for ROM   Knee/Hip Exercises: Standing   Forward Step Up Right;15 reps;Hand Hold: 1;Step Height: 4"   Step Down 10 reps;Hand Hold: 1;Step Height: 4";Right  min cues with increased difficulty returning  to step   Functional Squat 1 set;10 reps   Wall Squat 10 reps;1 set  5x5 sec hold   Knee/Hip Exercises: Seated   Long Arc Quad 15 reps;Strengthening   Other Seated Knee Exercises seated RLE hip flexion x 15 reps   Knee/Hip Exercises: Supine   Heel Slides AAROM;Right;10 reps   Knee/Hip Exercises: Sidelying   Hip ABduction 3 sets;10 reps;Strengthening;Right                PT Education - 02/13/14 1454    Education provided No             PT Long Term Goals - 02-13-14 1431    PT LONG TERM GOAL #1   Title independent with advanced HEP   Time 6   Period Weeks   Status Achieved   PT LONG TERM GOAL #2   Title improve R knee AROM 4-100 for improved ROM and functional mobility   Time 6   Period Weeks   Status Achieved   PT LONG TERM GOAL #3   Title negotiate single 4 in step with LRAD modified independent for improved mobility    Time 6   Period Weeks   Status Achieved   PT LONG TERM GOAL #4   Title  improve gait velocity to > 1.0 m/sec for improved mobility.   Time 6   Period Weeks   Status Achieved   PT LONG TERM GOAL #5   Title improve gait velocity to > 1.3 ft/sec for improved mobility. (03/04/14)   Time 4   Period Weeks   Status New   Additional Long Term Goals   Additional Long Term Goals Yes   PT LONG TERM GOAL #6   Title report ability to > 20 min without increase in pain (03/04/14)   Time 4   Period Weeks   Status New   PT LONG TERM GOAL #7   Title improve R knee AROM flexion to > 110 degrees for improved function (03/04/14)   Time 4   Period Weeks   Status New               Plan - 13-Feb-2014 1455    Clinical Impression Statement Pt has met all LTGs however continues to demonstrate functional limitations.  Will renew x 4 weeks and plan to address increased functional mobility and ROM for improved ADLs.   Pt will benefit from skilled therapeutic intervention in order to improve on the following deficits Abnormal gait;Decreased range of motion;Difficulty walking;Impaired flexibility;Pain;Impaired perceived functional ability;Decreased strength;Decreased mobility;Decreased balance;Decreased scar mobility;Decreased activity tolerance;Decreased endurance;Increased edema   Rehab Potential Good   PT Frequency 2x / week   PT Duration 4 weeks   PT Treatment/Interventions ADLs/Self Care Home Management;Moist Heat;Therapeutic activities;Patient/family education;Scar mobilization;Passive range of motion;Therapeutic exercise;DME Instruction;Ultrasound;Gait training;Balance training;Manual techniques;Neuromuscular re-education;Stair training;Cryotherapy;Electrical Stimulation;Functional mobility training   PT Next Visit Plan FOTO, add standing therex to HEP; functional mobility   Consulted and Agree with Plan of Care Patient          G-Codes - 02/13/2014 1458    Functional Assessment Tool Used R knee ROM 0-104   Functional Limitation Mobility: Walking and moving around    Mobility: Walking and Moving Around Current Status (W0981) At least 40 percent but less than 60 percent impaired, limited or restricted   Mobility: Walking and Moving Around Goal Status (X9147) At least 20 percent but less than 40 percent impaired, limited or restricted      Problem List Patient Active  Problem List   Diagnosis Date Noted  . 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 [place], [place], [address] Phone: [phone]   Fax:  [phone]

## 2014-02-07 ENCOUNTER — Ambulatory Visit: Payer: Medicare HMO | Admitting: Physical Therapy

## 2014-02-07 ENCOUNTER — Encounter: Payer: Self-pay | Admitting: Physical Therapy

## 2014-02-07 DIAGNOSIS — Z96651 Presence of right artificial knee joint: Secondary | ICD-10-CM

## 2014-02-07 DIAGNOSIS — M25561 Pain in right knee: Secondary | ICD-10-CM

## 2014-02-07 DIAGNOSIS — M25661 Stiffness of right knee, not elsewhere classified: Secondary | ICD-10-CM

## 2014-02-07 NOTE — Therapy (Signed)
[place], [place], [address] Phone: [phone]   Fax:  [phone]  Physical Therapy Treatment  Patient Details  Name: Samuel [name] MRN: [id number] Date of Birth: Nov 27, 1945  Encounter Date: 02/07/2014      PT End of Session - 02/07/14 1131    Visit Number 11   Number of Visits 18   Date for PT Re-Evaluation 03/06/14   PT Start Time 1107   PT Stop Time 7824   PT Time Calculation (min) 49 min   Activity Tolerance Patient tolerated treatment well      Past Medical History  Diagnosis Date  . Prostate cancer   . Sleep apnea     ????   O2 DESAT  10/26/13  IN ED    . Hepatitis A     25 YRS AGO  . Blind left eye   . Diabetes mellitus without complication     ?? borderline  being checked  . Cyst in hand 10/2013    left hand, treated with antibiotic & pain medicine    Past Surgical History  Procedure Laterality Date  . Joint replacement      LT KNEE 3 YRS AGO   . Eye surgery      LEFT AYE AS CHILD   . 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 Total knee arthroplasty Right 12/04/2013    Procedure: TOTAL KNEE ARTHROPLASTY;  Surgeon: Hessie Dibble, MD;  Location: [place];  Service: Orthopedics;  Laterality: Right;    There were no vitals taken for this visit.  Visit Diagnosis:  Status post total right knee replacement  Knee stiffness, right  Right knee pain      Subjective Assessment - 02/07/14 1110    Symptoms 3/10 pain today.  No other c/o or questions.   How long can you stand comfortably? 5 mins   How long can you walk comfortably? 11-30-18 min    Currently in Pain? Yes   Pain Score 3    Pain Location Knee   Pain Orientation Right   Pain Descriptors / Indicators Sore   Pain Type Surgical pain   Pain Onset More than a month ago   Pain Frequency Intermittent   Multiple Pain Sites No                    [facility] Adult PT Treatment/Exercise - 02/07/14 1114     Knee/Hip Exercises: Aerobic   Stationary Bike recumbant bike L 2, 5 min   Knee/Hip Exercises: Standing   Heel Raises 20 reps   Knee Flexion Strengthening;Both;1 set;10 reps   Wall Squat 10 reps;5 seconds   Other Standing Knee Exercises hip abd x 10 each, hip ext x 10 each   Knee/Hip Exercises: Supine   Heel Slides AAROM;Right;1 set;5 reps   Bridges Strengthening;2 sets;10 reps   Bridges Limitations knee ext in bridge  with ball   Straight Leg Raise with External Rotation Right;1 set;2 sets;20 reps   Straight Leg Raise with External Rotation Limitations 3#   Other Supine Knee Exercises Sidelying hip ABD 10 x 3 each added to HEP  bilat.    Other Supine Knee Exercises SIdelying clam bilat 10 x 3   Cryotherapy   Number Minutes Cryotherapy 10 Minutes   Cryotherapy Location Knee   Type of Cryotherapy Ice pack   Manual Therapy   Manual Therapy Massage   Edema Management STM to anterior thigh Med/Lateral  knee                     PT Long Term Goals - 02/04/14 1431    PT LONG TERM GOAL #1   Title independent with advanced HEP   Time 6   Period Weeks   Status Achieved   PT LONG TERM GOAL #2   Title improve R knee AROM 4-100 for improved ROM and functional mobility   Time 6   Period Weeks   Status Achieved   PT LONG TERM GOAL #3   Title negotiate single 4 in step with LRAD modified independent for improved mobility    Time 6   Period Weeks   Status Achieved   PT LONG TERM GOAL #4   Title improve gait velocity to > 1.0 m/sec for improved mobility.   Time 6   Period Weeks   Status Achieved   PT LONG TERM GOAL #5   Title improve gait velocity to > 1.3 ft/sec for improved mobility. (03/04/14)   Time 4   Period Weeks   Status New   Additional Long Term Goals   Additional Long Term Goals Yes   PT LONG TERM GOAL #6   Title report ability to > 20 min without increase in pain (03/04/14)   Time 4   Period Weeks   Status New   PT LONG TERM GOAL #7   Title improve  R knee AROM flexion to > 110 degrees for improved function (03/04/14)   Time 4   Period Weeks   Status New               Plan - 02/07/14 1150    Clinical Impression Statement Patient worked hard during his session today. AAROM increased to 106 deg Rt. LE.  FOTO done with improved. Already doing standing ther ex at home, nothing further added.  Educated on longer hold time for stretch.          Problem List Patient Active Problem List   Diagnosis Date Noted  . 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 [place], [place], [address] Phone: [phone]   Fax:  [phone]   Raeford Razor, PT 02/07/2014 12:07 PM Phone: [phone] Fax: [phone]

## 2014-02-11 ENCOUNTER — Ambulatory Visit: Payer: Medicare HMO | Admitting: Rehabilitation

## 2014-02-11 DIAGNOSIS — M25561 Pain in right knee: Secondary | ICD-10-CM

## 2014-02-11 DIAGNOSIS — Z96651 Presence of right artificial knee joint: Secondary | ICD-10-CM | POA: Diagnosis not present

## 2014-02-11 DIAGNOSIS — M25661 Stiffness of right knee, not elsewhere classified: Secondary | ICD-10-CM

## 2014-02-11 NOTE — Therapy (Signed)
[place], [place], [address] Phone: [phone]   Fax:  [phone]  Physical Therapy Treatment  Patient Details  Name: Samuel [name] MRN: [id number] Date of Birth: [birth year]  Encounter Date: 02/11/2014      PT End of Session - 02/11/14 1455    Visit Number 12   Number of Visits 18   Date for PT Re-Evaluation 03/06/14   PT Start Time 0220      Past Medical History  Diagnosis Date  . Prostate cancer   . Sleep apnea     ????   O2 DESAT  10/26/13  IN ED    . Hepatitis A     25 YRS AGO  . Blind left eye   . Diabetes mellitus without complication     ?? borderline  being checked  . Cyst in hand 10/2013    left hand, treated with antibiotic & pain medicine    Past Surgical History  Procedure Laterality Date  . Joint replacement      LT KNEE 3 YRS AGO   . Eye surgery      LEFT AYE AS CHILD   . 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 Total knee arthroplasty Right 12/04/2013    Procedure: TOTAL KNEE ARTHROPLASTY;  Surgeon: Hessie Dibble, MD;  Location: [place];  Service: Orthopedics;  Laterality: Right;    There were no vitals taken for this visit.  Visit Diagnosis:  Status post total right knee replacement  Knee stiffness, right  Right knee pain        [facility] PT Assessment - 02/11/14 0001    AROM   Right Knee Extension 0   Right Knee Flexion 105   Strength   Right Hip Flexion 4/5   Left Hip Flexion --  4+/5   Right Knee Flexion --  4-/5   Right Knee Extension 4/5                  [facility] Adult PT Treatment/Exercise - 02/11/14 1424    Knee/Hip Exercises: Stretches   Quad Stretch 3 reps;30 seconds  prone PROM   Knee/Hip Exercises: Aerobic   Stationary Bike Rec Bike Level 2    Knee/Hip Exercises: Standing   Lateral Step Up 10 reps;Step Height: 4";Hand Hold: 1   Forward Step Up 20 reps;Hand Hold: 1;Step Height: 6"   Functional Squat 1 set;10 reps   with RLE back (sit to stand)   Knee/Hip Exercises: Seated   Long Arc Quad Strengthening;20 reps;Weights   Long Arc Quad Weight 5 lbs.   Other Seated Knee Exercises 5# seated RLE hip flexion x 15 reps   Knee/Hip Exercises: Supine   Bridges 10 reps   Knee/Hip Exercises: Sidelying   Hip ABduction Right;20 reps   Other Sidelying Knee Exercises Clam x 20 right   Knee/Hip Exercises: Prone   Hamstring Curl 3 sets;10 reps   Modalities   Modalities Cryotherapy   Cryotherapy   Number Minutes Cryotherapy 10 Minutes   Cryotherapy Location Knee   Type of Cryotherapy Ice pack                     PT Long Term Goals - 02/04/14 1431    PT LONG TERM GOAL #1   Title independent with advanced HEP   Time 6   Period Weeks   Status Achieved   PT LONG TERM GOAL #2   Title improve  R knee AROM 4-100 for improved ROM and functional mobility   Time 6   Period Weeks   Status Achieved   PT LONG TERM GOAL #3   Title negotiate single 4 in step with LRAD modified independent for improved mobility    Time 6   Period Weeks   Status Achieved   PT LONG TERM GOAL #4   Title improve gait velocity to > 1.0 m/sec for improved mobility.   Time 6   Period Weeks   Status Achieved   PT LONG TERM GOAL #5   Title improve gait velocity to > 1.3 ft/sec for improved mobility. (03/04/14)   Time 4   Period Weeks   Status New   Additional Long Term Goals   Additional Long Term Goals Yes   PT LONG TERM GOAL #6   Title report ability to > 20 min without increase in pain (03/04/14)   Time 4   Period Weeks   Status New   PT LONG TERM GOAL #7   Title improve R knee AROM flexion to > 110 degrees for improved function (03/04/14)   Time 4   Period Weeks   Status New               Plan - 02/11/14 1457    Clinical Impression Statement Pt reports he can walk for 15 minutes without increased, he has not tried to walk longer, he can stand/walk for household activities 10 minutes   PT Next Visit Plan  continue functional strength, add to HEP, he does not have stairs        Problem List Patient Active Problem List   Diagnosis Date Noted  . 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

## 2014-02-13 ENCOUNTER — Ambulatory Visit: Payer: Medicare HMO | Admitting: Physical Therapy

## 2014-02-13 DIAGNOSIS — M25561 Pain in right knee: Secondary | ICD-10-CM

## 2014-02-13 DIAGNOSIS — M25661 Stiffness of right knee, not elsewhere classified: Secondary | ICD-10-CM

## 2014-02-13 DIAGNOSIS — Z96651 Presence of right artificial knee joint: Secondary | ICD-10-CM | POA: Diagnosis not present

## 2014-02-13 NOTE — Patient Instructions (Signed)
   Bridging   Slowly raise buttocks from floor, keeping stomach tight. Repeat ____ times per set. Do ____ sets per session. Do ____ sessions per day.  http://orth.exer.us/1096  HIP: Abduction / External Rotation (Band)   Place band around knees. Lie on side with hips and knees bent. Raise top knee up, squeezing glutes. Keep feet together. Hold _5__ seconds. Use _blue_______ band. _10-20__ reps per set, 1___ sets per day, ___4-5 days per week

## 2014-02-13 NOTE — Therapy (Signed)
[place], [place], [address] Phone: [phone]   Fax:  [phone]  Physical Therapy Treatment  Patient Details  Name: Samuel [name] MRN: [id number] Date of Birth: [birth year]  Encounter Date: 02/13/2014      PT End of Session - 02/13/14 1459    Visit Number 13   Number of Visits 18   Date for PT Re-Evaluation 03/06/14   PT Start Time 2841   PT Stop Time 1508   PT Time Calculation (min) 48 min   Activity Tolerance Patient tolerated treatment well      Past Medical History  Diagnosis Date  . Prostate cancer   . Sleep apnea     ????   O2 DESAT  10/26/13  IN ED    . Hepatitis A     25 YRS AGO  . Blind left eye   . Diabetes mellitus without complication     ?? borderline  being checked  . Cyst in hand 10/2013    left hand, treated with antibiotic & pain medicine    Past Surgical History  Procedure Laterality Date  . Joint replacement      LT KNEE 3 YRS AGO   . Eye surgery      LEFT AYE AS CHILD   . 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 Total knee arthroplasty Right 12/04/2013    Procedure: TOTAL KNEE ARTHROPLASTY;  Surgeon: Hessie Dibble, MD;  Location: [facility];  Service: Orthopedics;  Laterality: Right;    There were no vitals taken for this visit.  Visit Diagnosis:  Status post total right knee replacement  Knee stiffness, right  Right knee pain      Subjective Assessment - 02/13/14 1429    Symptoms Min pain today 1/10   Currently in Pain? Yes   Pain Score 1    Pain Location Knee   Pain Orientation Right   Pain Type Surgical pain   Pain Onset More than a month ago          [facility] PT Assessment - 02/13/14 1449    AROM   Right Knee Extension 0   Right Knee Flexion 105   Strength   Right Hip Flexion 4/5   Right Hip ABduction 3+/5   Left Hip Flexion 5/5   Left Hip ABduction 4/5   Right Knee Flexion 4/5   Right Knee Extension --  4+/5 mid range            [facility] Adult PT Treatment/Exercise - 02/13/14 1432    Knee/Hip Exercises: Aerobic   Stationary Bike --  NuStep level 7 UE and LE 6 min   Knee/Hip Exercises: Machines for Strengthening   Cybex Knee Extension --  2 sets x 2 plates x 10    Cybex Knee Flexion --  2 plates x 2 sets x 10    Knee/Hip Exercises: Supine   Heel Slides AAROM;Right;1 set  with sheet   Bridges 1 set;10 reps   Bridges Limitations --  add clam in bridge   Other Supine Knee Exercises Clam with blue band x 20   Knee/Hip Exercises: Sidelying   Hip ABduction Both;2 sets;10 reps   Other Sidelying Knee Exercises hip abd with fw kicks  Clam x 20 each side   Cryotherapy   Number Minutes Cryotherapy 10 Minutes   Cryotherapy Location Knee   Type of Cryotherapy Ice pack  PT Education - 02/13/14 1459    Education provided Yes   Education Details HEP for hip strengthening, gait   Person(s) Educated Patient   Methods Explanation;Handout   Comprehension Verbalized understanding;Returned demonstration             PT Long Term Goals - 02/13/14 1503    PT LONG TERM GOAL #1   Title independent with advanced HEP   Status Achieved   PT LONG TERM GOAL #2   Title improve R knee AROM 4-100 for improved ROM and functional mobility   Status Achieved   PT LONG TERM GOAL #3   Title negotiate single 4 in step with LRAD modified independent for improved mobility    Status Achieved   PT LONG TERM GOAL #4   Status Achieved   PT LONG TERM GOAL #5   Title improve gait velocity to > 1.3 ft/sec for improved mobility. (03/04/14)   Status On-going   PT LONG TERM GOAL #6   Title report ability to walk> 20 min without increase in pain (03/04/14)   Status On-going   PT LONG TERM GOAL #7   Title improve R knee AROM flexion to > 110 degrees for improved function (03/04/14)   Status On-going               Plan - 02/13/14 1501    Clinical Impression Statement Patient is limited in  endurance adnd hipo strength which limitsn his mobility. Sees MD 02/25/14 and will likely continue unitl then.  He will benefit from continued PRE and endurance training as tolerated.     Pt will benefit from skilled therapeutic intervention in order to improve on the following deficits Abnormal gait;Decreased range of motion;Difficulty walking;Impaired flexibility;Pain;Impaired perceived functional ability;Decreased strength;Decreased mobility;Decreased balance;Decreased scar mobility;Decreased activity tolerance;Decreased endurance;Increased edema   Rehab Potential Good   PT Frequency 2x / week   PT Duration 4 weeks   PT Treatment/Interventions ADLs/Self Care Home Management;Moist Heat;Therapeutic activities;Patient/family education;Scar mobilization;Passive range of motion;Therapeutic exercise;DME Instruction;Ultrasound;Gait training;Balance training;Manual techniques;Neuromuscular re-education;Stair training;Cryotherapy;Electrical Stimulation;Functional mobility training   PT Next Visit Plan progress standing, trunk/core and knee AROM   PT Home Exercise Plan added bridging anf bands.    Consulted and Agree with Plan of Care Patient        Problem List Patient Active Problem List   Diagnosis Date Noted  . 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 [place], [place], [address] Phone: [phone]   Fax:  [phone]   Raeford Razor, PT 02/13/2014 3:08 PM Phone: [phone] Fax: [phone]

## 2014-02-19 ENCOUNTER — Encounter: Payer: Self-pay | Admitting: Physical Therapy

## 2014-02-19 ENCOUNTER — Ambulatory Visit: Payer: Medicare HMO | Attending: Orthopaedic Surgery | Admitting: Physical Therapy

## 2014-02-19 DIAGNOSIS — M25661 Stiffness of right knee, not elsewhere classified: Secondary | ICD-10-CM | POA: Insufficient documentation

## 2014-02-19 DIAGNOSIS — M25561 Pain in right knee: Secondary | ICD-10-CM | POA: Diagnosis not present

## 2014-02-19 DIAGNOSIS — Z96651 Presence of right artificial knee joint: Secondary | ICD-10-CM | POA: Diagnosis not present

## 2014-02-19 NOTE — Therapy (Signed)
[place], [place], [address] Phone: [phone]   Fax:  [phone]  Physical Therapy Treatment  Patient Details  Name: Samuel [name] MRN: [id number] Date of Birth: [birth year]  Encounter Date: 02/19/2014      PT End of Session - 02/19/14 1207    Visit Number 14   Number of Visits 18   Date for PT Re-Evaluation 03/06/14   PT Start Time 1146   PT Stop Time 1761   PT Time Calculation (min) 49 min   Activity Tolerance Patient tolerated treatment well      Past Medical History  Diagnosis Date  . Prostate cancer   . Sleep apnea     ????   O2 DESAT  10/26/13  IN ED    . Hepatitis A     25 YRS AGO  . Blind left eye   . Diabetes mellitus without complication     ?? borderline  being checked  . Cyst in hand 10/2013    left hand, treated with antibiotic & pain medicine    Past Surgical History  Procedure Laterality Date  . Joint replacement      LT KNEE 3 YRS AGO   . Eye surgery      LEFT AYE AS CHILD   . 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 Total knee arthroplasty Right 12/04/2013    Procedure: TOTAL KNEE ARTHROPLASTY;  Surgeon: Hessie Dibble, MD;  Location: [facility];  Service: Orthopedics;  Laterality: Right;    There were no vitals taken for this visit.  Visit Diagnosis:  Status post total right knee replacement  Knee stiffness, right  Right knee pain      Subjective Assessment - 02/19/14 1152    Symptoms Feeling good today.  Denies pain.    How long can you stand comfortably? 5 mins   How long can you walk comfortably? 11-30-18 min    Currently in Pain? No/denies          [facility] Adult PT Treatment/Exercise - 02/19/14 1153    Ambulation/Gait   Ambulation/Gait --  No cane today   Knee/Hip Exercises: Aerobic   Stationary Bike --  NuStep level 8 5 min   Knee/Hip Exercises: Standing   Heel Raises 20 reps   Heel Raises Limitations --  off step   Forward Lunges  Right;1 set;5 reps   Forward Lunges Limitations --  for AAROM   Forward Step Up Right;20 reps;Hand Hold: 2   Wall Squat 10 reps   Other Standing Knee Exercises --  TKE into ball on wall   Knee/Hip Exercises: Seated   Long Arc Quad Strengthening;Right;1 set;20 reps   Other Seated Knee Exercises green band HS curl 2 x10    Other Seated Knee Exercises --  measured knee flexion to 104 deg in sitting   Knee/Hip Exercises: Prone   Hamstring Curl 2 sets;10 reps   Hip Extension Strengthening;Right;1 set;10 reps   Other Prone Exercises quad set x 10   Cryotherapy   Number Minutes Cryotherapy --   Cryotherapy Location --   Type of Cryotherapy --   Manual Therapy   Manual Therapy Joint mobilization   Joint Mobilization L knee flexion Gr II, conttract relax           PT Long Term Goals - 02/19/14 1222    PT LONG TERM GOAL #1   Title independent with advanced HEP   Status On-going  PT LONG TERM GOAL #2   Title improve R knee AROM 4-100 for improved ROM and functional mobility   Status Achieved   PT LONG TERM GOAL #3   Title negotiate single 4 in step with LRAD modified independent for improved mobility    Status Achieved   PT LONG TERM GOAL #4   Title improve gait velocity to > 1.0 m/sec for improved mobility.   Status Achieved   PT LONG TERM GOAL #5   Title improve gait velocity to > 1.3 ft/sec for improved mobility. (03/04/14)   Status On-going   PT LONG TERM GOAL #6   Title report ability to walk> 20 min without increase in pain (03/04/14)   Status On-going   PT LONG TERM GOAL #7   Title improve R knee AROM flexion to > 110 degrees for improved function (03/04/14)   Status On-going          Plan - 02/19/14 1220    Clinical Impression Statement No further goals met, he continues to make progress towards goals of endurance and walking. Achieved 104 deg flexion today with only min difficulty.    Pt will benefit from skilled therapeutic intervention in order to improve on  the following deficits Abnormal gait;Decreased range of motion;Difficulty walking;Impaired flexibility;Pain;Impaired perceived functional ability;Decreased strength;Decreased mobility;Decreased balance;Decreased scar mobility;Decreased activity tolerance;Decreased endurance;Increased edema   Rehab Potential Good   PT Frequency 2x / week   PT Next Visit Plan progress standing, trunk/core and knee AROM   PT Home Exercise Plan cont as previous   Consulted and Agree with Plan of Care Patient    MD NOTE NEXT VISIT    Problem List Patient Active Problem List   Diagnosis Date Noted  . 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 [place], [place], [address] Phone: [phone]   Fax:  [phone]   Raeford Razor, PT 02/19/2014 12:31 PM Phone: [phone] Fax: [phone]

## 2014-02-20 ENCOUNTER — Ambulatory Visit: Payer: Medicare HMO | Admitting: Rehabilitation

## 2014-02-20 DIAGNOSIS — M25561 Pain in right knee: Secondary | ICD-10-CM

## 2014-02-20 DIAGNOSIS — Z96651 Presence of right artificial knee joint: Secondary | ICD-10-CM

## 2014-02-20 DIAGNOSIS — M25661 Stiffness of right knee, not elsewhere classified: Secondary | ICD-10-CM

## 2014-02-20 NOTE — Therapy (Signed)
[place], [place], [address] Phone: [phone]   Fax:  [phone]  Physical Therapy Treatment  Patient Details  Name: Samuel [name] MRN: [id number] Date of Birth: August 15, 1945  Encounter Date: 02/20/2014      PT End of Session - 02/20/14 1235    Visit Number 15   Number of Visits 18   Date for PT Re-Evaluation 03/06/14   PT Start Time 5809   PT Stop Time 9833   PT Time Calculation (min) 39 min      Past Medical History  Diagnosis Date  . Prostate cancer   . Sleep apnea     ????   O2 DESAT  10/26/13  IN ED    . Hepatitis A     25 YRS AGO  . Blind left eye   . Diabetes mellitus without complication     ?? borderline  being checked  . Cyst in hand 10/2013    left hand, treated with antibiotic & pain medicine    Past Surgical History  Procedure Laterality Date  . Joint replacement      LT KNEE 3 YRS AGO   . Eye surgery      LEFT AYE AS CHILD   . 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 Total knee arthroplasty Right 12/04/2013    Procedure: TOTAL KNEE ARTHROPLASTY;  Surgeon: Hessie Dibble, MD;  Location: [facility];  Service: Orthopedics;  Laterality: Right;    There were no vitals taken for this visit.  Visit Diagnosis:  Status post total right knee replacement  Knee stiffness, right  Right knee pain      Subjective Assessment - 02/20/14 1205    Symptoms I have not used the cane in one week, no problems   Currently in Pain? Yes   Pain Score 2    Pain Location Knee   Pain Orientation Right   Pain Descriptors / Indicators Sore   Pain Type Surgical pain   Pain Onset More than a month ago   Pain Frequency Intermittent   Aggravating Factors  therex   Pain Relieving Factors rest                    [facility] Adult PT Treatment/Exercise - 02/20/14 1227    Knee/Hip Exercises: Aerobic   Stationary Bike Nustep Level 6 LE x 5 min   Knee/Hip Exercises: Machines for  Strengthening   Cybex Knee Extension 1 plate Bil concentric R eccentric x 20, the bil 2 plates x 20   Cybex Knee Flexion 3 plates bil x 30   Total Gym Leg Press 3 plates 20 x 2, then single leg 1 plate,only able to perform 4 reps right   Knee/Hip Exercises: Standing   Lateral Step Up 1 set;10 reps;Hand Hold: 1;Step Height: 4"   Forward Step Up Right;10 reps;1 set;Step Height: 6";Hand Hold: 1   Step Down Right;10 reps;1 set;Step Height: 4";Hand Hold: 1   Wall Squat 2 sets;10 reps   Knee/Hip Exercises: Supine   Terminal Knee Extension --  seated with fitter, 1 black, 1 blue x 30 RLE   Bridges 2 sets;10 reps  with blue band clams                     PT Long Term Goals - 02/19/14 1222    PT LONG TERM GOAL #1   Title independent with advanced HEP   Status  On-going   PT LONG TERM GOAL #2   Title improve R knee AROM 4-100 for improved ROM and functional mobility   Status Achieved   PT LONG TERM GOAL #3   Title negotiate single 4 in step with LRAD modified independent for improved mobility    Status Achieved   PT LONG TERM GOAL #4   Title improve gait velocity to > 1.0 m/sec for improved mobility.   Status Achieved   PT LONG TERM GOAL #5   Title improve gait velocity to > 1.3 ft/sec for improved mobility. (03/04/14)   Status On-going   PT LONG TERM GOAL #6   Title report ability to walk> 20 min without increase in pain (03/04/14)   Status On-going   PT LONG TERM GOAL #7   Title improve R knee AROM flexion to > 110 degrees for improved function (03/04/14)   Status On-going               Plan - 02/20/14 1236    Clinical Impression Statement decreased endurance, multiple standing rest breaks required between exercises, unable to tolerate multiple sets of standing exercises   PT Next Visit Plan progress standing, trunk/core and knee AROM        Problem List Patient Active Problem List   Diagnosis Date Noted  . 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 [place], [place], [address] Phone: [phone]   Fax:  [phone]

## 2014-02-26 ENCOUNTER — Ambulatory Visit: Payer: Medicare HMO | Admitting: Physical Therapy

## 2014-02-26 DIAGNOSIS — M25561 Pain in right knee: Secondary | ICD-10-CM

## 2014-02-26 DIAGNOSIS — Z96651 Presence of right artificial knee joint: Secondary | ICD-10-CM | POA: Diagnosis not present

## 2014-02-26 DIAGNOSIS — M25661 Stiffness of right knee, not elsewhere classified: Secondary | ICD-10-CM

## 2014-02-26 NOTE — Therapy (Signed)
[place], [place], [address] Phone: [phone]   Fax:  [phone]  Physical Therapy Treatment  Patient Details  Name: Samuel [name] MRN: [id number] Date of Birth: 02-Mar-1945 Referring Provider:  Elizabeth Palau, *  Encounter Date: 02/26/2014      PT End of Session - 02/26/14 1639    Visit Number 16   Number of Visits 18   Date for PT Re-Evaluation 03/06/14   PT Start Time 5465   PT Stop Time 1422   PT Time Calculation (min) 48 min   Activity Tolerance Patient tolerated treatment well      Past Medical History  Diagnosis Date  . Prostate cancer   . Sleep apnea     ????   O2 DESAT  10/26/13  IN ED    . Hepatitis A     25 YRS AGO  . Blind left eye   . Diabetes mellitus without complication     ?? borderline  being checked  . Cyst in hand 10/2013    left hand, treated with antibiotic & pain medicine    Past Surgical History  Procedure Laterality Date  . Joint replacement      LT KNEE 3 YRS AGO   . Eye surgery      LEFT AYE AS CHILD   . 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 Total knee arthroplasty Right 12/04/2013    Procedure: TOTAL KNEE ARTHROPLASTY;  Surgeon: Hessie Dibble, MD;  Location: [facility];  Service: Orthopedics;  Laterality: Right;    There were no vitals taken for this visit.  Visit Diagnosis:  Status post total right knee replacement  Knee stiffness, right  Right knee pain      Subjective Assessment - 02/26/14 1416    Symptoms No c/o today, pain is 1/10-2/10.     Pertinent History Lt. TKR, R TKA   How long can you sit comfortably? unlimited   How long can you stand comfortably? 5 mins   How long can you walk comfortably? 20-25 min    Patient Stated Goals decrease pain, improve ROM, functional mobility including walking longer distances   Currently in Pain? --  See above, previous          [facility] PT Assessment - 02/26/14 1503    AROM   Right Knee Extension 0   Right Knee Flexion 108   Strength   Right Hip ABduction 4-/5   Left Hip ABduction 4/5                  [facility] Adult PT Treatment/Exercise - 02/26/14 1424    Knee/Hip Exercises: Standing   Terminal Knee Extension Strengthening;Right;1 set;20 reps;Theraband   Theraband Level (Terminal Knee Extension) Level 3 (Green)   SLS with Vectors --  ABD and EXT green band x 15 each LE   Walking with Sports Cord --  walking with weight,FW and lateral x 6 each, 3 plates   Knee/Hip Exercises: Supine   Bridges Strengthening;Both;2 sets   Bridges Limitations add leg lift and knees in  feet on ball   Straight Leg Raise with External Rotation Right;1 set;20 reps   Other Supine Knee Exercises HS curl with ball x 10   Knee/Hip Exercises: Sidelying   Hip ABduction Strengthening;Right;1 set;20 reps                PT Education - 02/26/14 1639    Education provided Yes  Education Details DC plan   Methods Explanation   Comprehension Verbalized understanding             PT Long Term Goals - Feb 27, 2014 1504    PT LONG TERM GOAL #1   Title independent with advanced HEP   Status Achieved   PT LONG TERM GOAL #2   Title improve R knee AROM 4-100 for improved ROM and functional mobility   Status Achieved   PT LONG TERM GOAL #3   Title negotiate single 4 in step with LRAD modified independent for improved mobility    Status Achieved   PT LONG TERM GOAL #4   Title improve gait velocity to > 1.0 m/sec for improved mobility.   Status Achieved   PT LONG TERM GOAL #5   Title improve gait velocity to > 1.3 ft/sec for improved mobility. (03/04/14)   Status Achieved   PT LONG TERM GOAL #6   Title report ability to walk> 20 min without increase in pain (03/04/14)   Status Achieved   PT LONG TERM GOAL #7   Title improve R knee AROM flexion to > 110 degrees for improved function (03/04/14)   Status Not Met               Plan - 27-Feb-2014 1640    Clinical  Impression Statement Pt. feels he is ready for DC.    PT Next Visit Plan DC   Consulted and Agree with Plan of Care Patient          G-Codes - 2014/02/27 1514    Functional Assessment Tool Used FOTO   Functional Limitation Mobility: Walking and moving around   Mobility: Walking and Moving Around Current Status [phone]) At least 40 percent but less than 60 percent impaired, limited or restricted   Mobility: Walking and Moving Around Goal Status [phone]) At least 40 percent but less than 60 percent impaired, limited or restricted   Mobility: Walking and Moving Around Discharge Status [phone]) At least 40 percent but less than 60 percent impaired, limited or restricted      Problem List Patient Active Problem List   Diagnosis Date Noted  . 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  Patient goals were partially met. Patient is being discharged due to being pleased with the current functional level.  ?????    Raeford Razor, PT 2014-02-27 4:42 PM Phone: [phone] Fax: [phone]

## 2014-02-28 ENCOUNTER — Encounter: Payer: Medicare HMO | Admitting: Rehabilitation

## 2014-03-05 ENCOUNTER — Encounter: Payer: Medicare HMO | Admitting: Physical Therapy

## 2014-03-07 ENCOUNTER — Encounter: Payer: Medicare HMO | Admitting: Rehabilitation

## 2015-04-09 ENCOUNTER — Encounter: Payer: Self-pay | Admitting: Internal Medicine

## 2015-04-09 ENCOUNTER — Ambulatory Visit (INDEPENDENT_AMBULATORY_CARE_PROVIDER_SITE_OTHER): Payer: Commercial Managed Care - HMO | Admitting: Internal Medicine

## 2015-04-09 VITALS — BP 124/80 | HR 81 | Temp 97.8°F | Resp 20 | Ht 68.0 in | Wt 257.4 lb

## 2015-04-09 DIAGNOSIS — Z96653 Presence of artificial knee joint, bilateral: Secondary | ICD-10-CM | POA: Diagnosis not present

## 2015-04-09 DIAGNOSIS — E785 Hyperlipidemia, unspecified: Secondary | ICD-10-CM

## 2015-04-09 DIAGNOSIS — N62 Hypertrophy of breast: Secondary | ICD-10-CM | POA: Diagnosis not present

## 2015-04-09 DIAGNOSIS — R739 Hyperglycemia, unspecified: Secondary | ICD-10-CM | POA: Diagnosis not present

## 2015-04-09 DIAGNOSIS — H5440 Blindness, one eye, unspecified eye: Secondary | ICD-10-CM

## 2015-04-09 DIAGNOSIS — H5442 Blindness, left eye, normal vision right eye: Secondary | ICD-10-CM | POA: Diagnosis not present

## 2015-04-09 DIAGNOSIS — C61 Malignant neoplasm of prostate: Secondary | ICD-10-CM

## 2015-04-09 NOTE — Patient Instructions (Signed)
Continue current medications as ordered  Follow up with Dr Junious Silk as scheduled  Get old records from Dr Kennon Holter  Follow up in 1-2 mos for CPE/ECG. Fasting labs prior to appt

## 2015-04-09 NOTE — Progress Notes (Signed)
Patient ID: Samuel [name], male   DOB: 07/29/1945, 70 y.o.   MRN: [id number]    Location:    [facility]   Place of Service:   OFFICE   Advanced Directive information Does patient have an advance directive?: No, Would patient like information on creating an advanced directive?: Yes - Educational materials given  Chief Complaint  Patient presents with  . Establish Care    HPI:  70 yo male seen today as a new pt. His previous PCP Dr Kennon Holter retired. Last CPE > 1 yr ago.  Hx prostate CA - 1st dx in 2003 and was tx with external beam XRT. It recurred in 2009 and has been getting hormone injections q28mo -->q639mocurrently. He also takes bicalutamide 5040maily for suppression. [facility] and eye was "knocked out"  DM - borderline per pt. He does not take any meds. Takes ASA daily. He does not check BS at home  Hyperlipidemia - has taken lipitor in the past but currently diet controlled. Exercises 3-4 times per week (walking, squats, sit ups)  Knee DJD - s/p b/l TKR. Followed by Ortho Dr DalRhona Raider [employer]  Diagnosis Date  . Prostate cancer ([facility] . Sleep apnea     ????   O2 DESAT  10/26/13  IN ED    . Hepatitis A     25 YRS AGO  . Blind left eye   . [place]   ?? borderline  being checked  . Cyst in hand 10/2013    left hand, treated with antibiotic & pain medicine    Past Surgical History  Procedure Laterality Date  . Joint replacement      LT KNEE 3 YRS AGO   . Eye surgery      LEFT AYE AS CHILD   . Total knee arthroplasty Right 12/04/2013    dr dalRhona Raider Total knee arthroplasty Right 12/04/2013    Procedure: TOTAL KNEE ARTHROPLASTY;  Surgeon: PetHessie DibbleD;  [facility];  Laterality: Right;    Patient Care Team: MonGildardo CrankerO as PCP - General (Internal Medicine) MatFestus AloeD as Consulting Physician  (Urology)  Social History   Social History  . Marital Status: Significant Other    Spouse Name: N/A  . Number of Children: N/A  . Years of Education: N/A   Occupational History  . Not on file.   Social History Main Topics  . Smoking status: Former Smoker -- 0.50 packs/day for 5 years    Types: Cigarettes    Quit date: 02/16/1983  . Smokeless tobacco: Never Used  . Alcohol Use: No  . Drug Use: No  . Sexual Activity: Not on file   Other Topics Concern  . 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 His smoking use included Cigarettes. He has a 2.5 pack-year smoking history. He has never used smokeless tobacco. He reports that he does not drink alcohol or use illicit drugs.  Family History  Problem Relation Age of Onset  . Stroke Mother   . Heart attack Mother   . Diabetes Mother   . Stroke Father   . Stomach cancer Brother   . Prostate cancer Brother   . Throat cancer Sister    Family Status  Relation Status Death Age  . Mother Deceased 42  . Sister Deceased   . Daughter Alive   . Father Deceased 59  . Brother Alive   . Sister Deceased   . Brother Deceased   . Brother Deceased   . Sister Deceased   . Brother Alive   . Sister Alive   . Sister Alive   . Brother Alive   . Daughter Alive      There is no immunization history on file for this patient.  No Known Allergies  Medications: Patient's Medications    New Prescriptions   No medications on file  Previous Medications   ASPIRIN 325 MG TABLET    Take 325 mg by mouth daily.   ATORVASTATIN (LIPITOR) 10 MG TABLET    Take 10 mg by mouth every morning. Reported on 04/09/2015   BICALUTAMIDE (CASODEX) 50 MG TABLET    Take 150 mg by mouth every morning.  Modified Medications   No medications on file  Discontinued Medications   HYDROCODONE-ACETAMINOPHEN (NORCO/VICODIN) 5-325 MG PER TABLET    Take 1-2 tablets by mouth every 4 (four) hours as needed (breakthrough pain).   METHOCARBAMOL (ROBAXIN) 500 MG TABLET    Take 1 tablet (500 mg total) by mouth every 6 (six) hours as needed for muscle spasms.    Review of Systems  HENT: Positive for dental problem (dentures) and tinnitus.   Respiratory: Positive for cough.   Genitourinary: Positive for decreased urine volume (weak stream).  Musculoskeletal: Positive for joint swelling and arthralgias.  Neurological:       Tingling in left fingertips since ganglion cyst removal     Filed Vitals:   04/09/15 0833  BP: 124/80  Pulse: 81  Temp: 97.8 F (36.6 C)  TempSrc: Oral  Resp: 20  Height: '5\' 8"'  (1.727 m)  Weight: 257 lb 6.4 oz (116.756 kg)  SpO2: 94%   Body mass index is 39.15 kg/(m^2).  Physical Exam  Constitutional: He is oriented to person, place, and time. He appears well-developed and well-nourished.  HENT:  Mouth/Throat: Oropharynx is clear and moist.  Lower dentures  Eyes: Pupils are equal, round, and reactive to light. Right eye exhibits no discharge. Left eye exhibits no discharge. No scleral icterus.  Left corneal clouding  Neck: Neck supple. Carotid bruit is not present.  Cardiovascular: Normal rate, regular rhythm, normal heart sounds and intact distal pulses.  Exam reveals no gallop and no friction rub.   No murmur heard. no distal LE swelling. No calf TTP  Pulmonary/Chest: Effort normal and breath sounds normal. He has no wheezes. He has no rales. He exhibits no tenderness.  Breasts are asymmetrical.    Abdominal: Soft. Bowel sounds are normal. He exhibits no distension, no abdominal  bruit, no pulsatile midline mass and no mass. There is no tenderness. There is no rebound and no guarding.  Musculoskeletal: He exhibits edema.  Lymphadenopathy:    He has no cervical adenopathy.  Neurological: He is alert and oriented to person, place, and time.  Skin: Skin is warm and dry. No rash noted.  Psychiatric: He has a normal mood and affect. His behavior is normal. Judgment and thought content normal.     Labs reviewed: No visits with results within 3 Month(s) from this visit. Latest known visit with results is:  Admission on 12/04/2013, Discharged on 12/06/2013  Component Date Value Ref Range Status  . Glucose-Capillary 12/04/2013 109* 70 - 99 mg/dL Final  . Comment 1 12/04/2013 Documented in Chart   Final  . Comment 2 12/04/2013 Notify RN   Final  . Glucose-Capillary 12/04/2013 105* 70 - 99 mg/dL Final  . Comment 1 12/04/2013 Notify RN   Final  . WBC 12/05/2013 7.7  4.0 - 10.5 K/uL Final  . RBC 12/05/2013 3.93* 4.22 - 5.81 MIL/uL Final  . Hemoglobin 12/05/2013 11.9* 13.0 - 17.0 g/dL Final  . HCT 12/05/2013 35.7* 39.0 - 52.0 % Final  . MCV 12/05/2013 90.8  78.0 - 100.0 fL Final  . MCH 12/05/2013 30.3  26.0 - 34.0 pg Final  . MCHC 12/05/2013 33.3  30.0 - 36.0 g/dL Final  . RDW 12/05/2013 14.0  11.5 - 15.5 % Final  . Platelets 12/05/2013 204  150 - 400 K/uL Final  . Sodium 12/05/2013 135* 137 - 147 mEq/L Final  . Potassium 12/05/2013 4.5  3.7 - 5.3 mEq/L Final  . Chloride 12/05/2013 101  96 - 112 mEq/L Final  . CO2 12/05/2013 22  19 - 32 mEq/L Final  . Glucose, Bld 12/05/2013 168* 70 - 99 mg/dL Final  . BUN 12/05/2013 16  6 - 23 mg/dL Final  . Creatinine, Ser 12/05/2013 0.78  0.50 - 1.35 mg/dL Final  . Calcium 12/05/2013 8.9  8.4 - 10.5 mg/dL Final  . GFR calc non Af Amer 12/05/2013 >90  >90 mL/min Final  . GFR calc Af Amer 12/05/2013 >90  >90 mL/min Final    Comment: (NOTE)                          The eGFR has been calculated using the CKD EPI equation.                          This calculation has not been validated in all clinical situations.                          eGFR's persistently <90 mL/min signify possible Chronic Kidney                          Disease.  . Anion gap 12/05/2013 12  5 - 15 Final  . Glucose-Capillary 12/04/2013 145* 70 - 99 mg/dL Final  . Glucose-Capillary 12/05/2013 176* 70 - 99 mg/dL Final  . WBC 12/06/2013 8.8  4.0 - 10.5 K/uL Final  . RBC 12/06/2013 4.07* 4.22 - 5.81 MIL/uL Final  . Hemoglobin 12/06/2013 12.1* 13.0 - 17.0 g/dL Final  . HCT 12/06/2013 37.0* 39.0 - 52.0 % Final  . MCV 12/06/2013 90.9  78.0 - 100.0 fL Final  . MCH 12/06/2013 29.7  26.0 - 34.0 pg Final  .  MCHC 12/06/2013 32.7  30.0 - 36.0 g/dL Final  . RDW 12/06/2013 13.9  11.5 - 15.5 % Final  . Platelets 12/06/2013 211  150 - 400 K/uL Final    No results found.   Assessment/Plan   ICD-9-CM ICD-10-CM   1. Hyperlipidemia 272.4 E78.5 CMP     Lipid Panel     TSH  2. Hyperglycemia 790.29 R73.9 CMP     Hemoglobin A1C     CBC with Differential  3. Status post total bilateral knee replacement V43.65 Z96.653   4. Prostate cancer ([place]) 185 C61 CMP     CBC with Differential  5. Gynecomastia, male 611.1 N62 CBC with Differential  6. Blind left eye 369.60 H54.42    Continue current medications as ordered  Follow up with Dr Junious Silk as scheduled  F/u with Ortho as scheduled  Get old records from Dr Kennon Holter  Follow up in 1-2 mos for CPE/ECG. Fasting labs prior to appt  [place] S. Perlie Gold  [facility] and Adult Medicine [address] [place], [place] [address] [phone] Cell (Monday-Friday 8 AM - 5 PM) [phone] After 5 PM and follow prompts

## 2015-05-19 ENCOUNTER — Other Ambulatory Visit: Payer: Commercial Managed Care - HMO

## 2015-05-19 DIAGNOSIS — N62 Hypertrophy of breast: Secondary | ICD-10-CM

## 2015-05-19 DIAGNOSIS — E785 Hyperlipidemia, unspecified: Secondary | ICD-10-CM

## 2015-05-19 DIAGNOSIS — R739 Hyperglycemia, unspecified: Secondary | ICD-10-CM | POA: Diagnosis not present

## 2015-05-19 DIAGNOSIS — C61 Malignant neoplasm of prostate: Secondary | ICD-10-CM | POA: Diagnosis not present

## 2015-05-20 ENCOUNTER — Telehealth: Payer: Self-pay

## 2015-05-20 LAB — COMPREHENSIVE METABOLIC PANEL
A/G RATIO: 1.4 (ref 1.2–2.2)
ALT: 15 IU/L (ref 0–44)
AST: 23 IU/L (ref 0–40)
Albumin: 4.2 g/dL (ref 3.5–4.8)
Alkaline Phosphatase: 89 IU/L (ref 39–117)
BILIRUBIN TOTAL: 0.4 mg/dL (ref 0.0–1.2)
BUN/Creatinine Ratio: 18 (ref 10–24)
BUN: 17 mg/dL (ref 8–27)
CHLORIDE: 103 mmol/L (ref 96–106)
CO2: 18 mmol/L (ref 18–29)
Calcium: 9.6 mg/dL (ref 8.6–10.2)
Creatinine, Ser: 0.97 mg/dL (ref 0.76–1.27)
GFR calc non Af Amer: 78 mL/min/{1.73_m2} (ref >59)
GFR, EST AFRICAN AMERICAN: 90 mL/min/{1.73_m2} (ref >59)
Globulin, Total: 3 g/dL (ref 1.5–4.5)
Glucose: 112 mg/dL — ABNORMAL HIGH (ref 65–99)
POTASSIUM: 4.5 mmol/L (ref 3.5–5.2)
Sodium: 141 mmol/L (ref 134–144)
TOTAL PROTEIN: 7.2 g/dL (ref 6.0–8.5)

## 2015-05-20 LAB — CBC WITH DIFFERENTIAL/PLATELET
BASOS ABS: 0 10*3/uL (ref 0.0–0.2)
Basos: 0 %
EOS (ABSOLUTE): 0.1 10*3/uL (ref 0.0–0.4)
Eos: 1 %
Hematocrit: 42 % (ref 37.5–51.0)
Hemoglobin: 14.5 g/dL (ref 12.6–17.7)
IMMATURE GRANS (ABS): 0 10*3/uL (ref 0.0–0.1)
Immature Granulocytes: 0 %
LYMPHS: 43 %
Lymphocytes Absolute: 1.9 10*3/uL (ref 0.7–3.1)
MCH: 30.5 pg (ref 26.6–33.0)
MCHC: 34.5 g/dL (ref 31.5–35.7)
MCV: 88 fL (ref 79–97)
MONOS ABS: 0.3 10*3/uL (ref 0.1–0.9)
Monocytes: 7 %
NEUTROS ABS: 2.2 10*3/uL (ref 1.4–7.0)
Neutrophils: 49 %
PLATELETS: 238 10*3/uL (ref 150–379)
RBC: 4.75 x10E6/uL (ref 4.14–5.80)
RDW: 13.3 % (ref 12.3–15.4)
WBC: 4.5 10*3/uL (ref 3.4–10.8)

## 2015-05-20 LAB — TSH: TSH: 1.04 u[IU]/mL (ref 0.450–4.500)

## 2015-05-20 LAB — HEMOGLOBIN A1C
ESTIMATED AVERAGE GLUCOSE: 137 mg/dL
HEMOGLOBIN A1C: 6.4 % — AB (ref 4.8–5.6)

## 2015-05-20 LAB — LIPID PANEL
Chol/HDL Ratio: 3.4 ratio units (ref 0.0–5.0)
Cholesterol, Total: 236 mg/dL — ABNORMAL HIGH (ref 100–199)
HDL: 70 mg/dL (ref >39)
LDL Calculated: 149 mg/dL — ABNORMAL HIGH (ref 0–99)
Triglycerides: 84 mg/dL (ref 0–149)
VLDL Cholesterol Cal: 17 mg/dL (ref 5–40)

## 2015-05-20 MED ORDER — ATORVASTATIN CALCIUM 10 MG PO TABS: 10.0000 mg | ORAL_TABLET | Freq: Every morning | ORAL | Status: DC

## 2015-05-20 NOTE — Telephone Encounter (Signed)
-----   Message from [place], [place] sent at 05/20/2015 10:47 AM EDT ----- BS are borderline diabetic - watch complex CHO; cholesterol is elevated - is he taking lipitor as rx?; watch fatty foods; other labs stable; f/u as scheduled

## 2015-05-20 NOTE — Telephone Encounter (Signed)
Discussed labs with patient, patient not on lipitor for 6 months or longer. Patient states he never picked up rx. Patient requested new rx be sent to pharmacy on file. Copy of labs and list of complex carbohydrates mailed to patient.

## 2015-05-21 ENCOUNTER — Encounter: Payer: Self-pay | Admitting: Internal Medicine

## 2015-05-21 ENCOUNTER — Ambulatory Visit (INDEPENDENT_AMBULATORY_CARE_PROVIDER_SITE_OTHER): Payer: Commercial Managed Care - HMO | Admitting: Internal Medicine

## 2015-05-21 VITALS — BP 136/86 | HR 72 | Temp 97.9°F | Resp 20 | Ht 68.5 in | Wt 259.0 lb

## 2015-05-21 DIAGNOSIS — Z0000 Encounter for general adult medical examination without abnormal findings: Secondary | ICD-10-CM

## 2015-05-21 DIAGNOSIS — E669 Obesity, unspecified: Secondary | ICD-10-CM | POA: Diagnosis not present

## 2015-05-21 DIAGNOSIS — N62 Hypertrophy of breast: Secondary | ICD-10-CM

## 2015-05-21 DIAGNOSIS — E785 Hyperlipidemia, unspecified: Secondary | ICD-10-CM | POA: Diagnosis not present

## 2015-05-21 DIAGNOSIS — C61 Malignant neoplasm of prostate: Secondary | ICD-10-CM | POA: Diagnosis not present

## 2015-05-21 DIAGNOSIS — R739 Hyperglycemia, unspecified: Secondary | ICD-10-CM

## 2015-05-21 NOTE — Patient Instructions (Addendum)
Send cologuard for colon cancer screening  Encouraged him to exercise 30-45 minutes 4-5 times per week. Eat a well balanced diet. Avoid smoking. Limit alcohol intake. Wear seatbelt when riding in the car. Wear sun block (SPF >50) when spending extended times outside.  Continue current medications as ordered  Follow up in 4 mos for routine visit. Fasting labs prior to appt

## 2015-05-21 NOTE — Progress Notes (Signed)
Patient ID: Samuel [name], male   DOB: April 11, 1945, 70 y.o.   MRN: [id number]   Location:   [facility]   Place of Service:   OFFICE Provider: Dr Arletha Grippe  Patient Care Team: [name], DO as PCP - General (Internal Medicine) Festus Aloe, MD as Consulting Physician (Urology)  Extended Emergency Contact Information Primary Emergency Contact: [facility] Address: [place], [place] [address] Johnnette Litter of [place] Phone: [phone] Mobile Phone: [phone] Relation: Significant other Secondary Emergency Contact: Saunders Glance [place] of [place] Mobile Phone: [phone] Relation: Daughter  Goals of Care: Advanced Directive information Advanced Directives 05/21/2015  Does patient have an advance directive? No  Would patient like information on creating an advanced directive? Yes - Environmental education officer Complaint  Patient presents with  . Annual Exam    Anual Exam, Labs Printed    HPI: Patient is a 70 y.o. male seen in today for an annual wellness exam.    Hx prostate CA - 1st dx in 2003 and was tx with external beam XRT. It recurred in 2009 and has been getting hormone injections q83mos -->q102mos currently. He also takes bicalutamide 50mg  daily for suppression. nocturia 3-4 times per night. Followed by urology Dr Junious Silk. 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 He does not take any meds. Takes ASA daily. He does not check BS at home  Hyperlipidemia - he resumed lipitor. Exercises 3-4 times per week (walking, squats, sit ups). LDL 149, total cholesterol 236; HDL 70  Knee DJD - s/p b/l TKR. Followed by Ortho Dr Rhona Raider at [facility] colonoscopy in 2015 in [place] per pt  Depression screen [facility] 2/9 05/21/2015  Decreased Interest 0  Down, Depressed, Hopeless 0  PHQ - 2 Score 0    Fall Risk  05/21/2015  Falls in the past year? No   MMSE - Mini  Mental State Exam 05/21/2015  Not completed: (No Data)  Orientation to time 5  Orientation to Place 5  Registration 3  Attention/ Calculation 5  Recall 1  Language- name 2 objects 2  Language- repeat 1  Language- follow 3 step command 3  Language- read & follow direction 1  Write a sentence 1  Copy design 1  Total score 28     Health Maintenance  Topic Date Due  . Hepatitis C Screening  10-10-1945  . TETANUS/TDAP  03/26/1963  . COLONOSCOPY  03/25/1994  . ZOSTAVAX  03/25/2004  . PNA vac Low Risk Adult (1 of 2 - PCV13) 03/25/2009  . INFLUENZA VACCINE  09/16/2015    Urinary incontinence? No issues  Functional Status Survey: Is the patient deaf or have difficulty hearing?: No Does the patient have difficulty seeing, even when wearing glasses/contacts?: No (he is blin in left eye) Does the patient have difficulty concentrating, remembering, or making decisions?: No Does the patient have difficulty walking or climbing stairs?: No Does the patient have difficulty dressing or bathing?: No Does the patient have difficulty doing errands alone such as visiting a doctor's office or shopping?: No Exercise? yes  Diet? Recommend low fat low cholesterol meals   Visual Acuity Screening   Right eye Left eye Both eyes  Without correction: 20/30 blind 20/25  With correction:      Dentition: lower partial dentures  Pain:  none   Past Medical History  Diagnosis Date  . Prostate cancer ([place])   . Sleep apnea     ????   O2 DESAT  10/26/13  IN ED    . Hepatitis A     25 YRS AGO  . Blind left eye   . Diabetes mellitus without complication ([place])     ?? borderline  being checked  . Cyst in hand 10/2013    left hand, treated with antibiotic & pain medicine    Past Surgical History  Procedure Laterality Date  . Joint replacement      LT KNEE 3 YRS AGO   . Eye surgery      LEFT AYE AS CHILD   . 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 Total knee arthroplasty Right  12/04/2013    Procedure: TOTAL KNEE ARTHROPLASTY;  Surgeon: Hessie Dibble, MD;  Location: [facility];  Service: Orthopedics;  Laterality: Right;    Family History  Problem Relation Age of Onset  . Stroke Mother   . Heart attack Mother   . Diabetes Mother   . Stroke Father   . Stomach cancer Brother   . Prostate cancer Brother   . Throat cancer Sister     Social History   Social History  . Marital Status: Significant Other    Spouse Name: N/A  . Number of Children: N/A  . Years of Education: N/A   Occupational History  . Not on file.   Social History Main Topics  . Smoking status: Former Smoker -- 0.50 packs/day for 5 years    Types: Cigarettes    Quit date: 02/16/1983  . Smokeless tobacco: Never Used  . Alcohol Use: No  . Drug Use: No  . Sexual Activity: Not on file   Other Topics Concern  . 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  Always use your most recent med list.               aspirin 325 MG tablet  Take 325 mg by mouth daily.     atorvastatin 10 MG tablet  Commonly known as:  LIPITOR  Take 1 tablet (10 mg total) by mouth every morning. Reported on 04/09/2015     bicalutamide 50 MG tablet  Commonly known as:  CASODEX  Take 150 mg by mouth every morning.         Review of Systems:  Review of Systems  Physical Exam: Filed Vitals:   05/21/15 1448    BP: 136/86  Pulse: 72  Temp: 97.9 F (36.6 C)  TempSrc: Oral  Resp: 20  Height: 5' 8.5" (1.74 m)  Weight: 259 lb (117.482 kg)  SpO2: 97%   Body mass index is 38.8 kg/(m^2). Physical Exam  Constitutional: He is oriented to person, place, and time. He appears well-developed and well-nourished. No distress.  HENT:  Head: Normocephalic and atraumatic.  Right Ear: Hearing, tympanic membrane, external ear and ear canal normal.  Left Ear: Hearing, tympanic membrane, external ear and ear canal normal.  Mouth/Throat: Uvula is midline, oropharynx is clear and moist and mucous membranes are normal. He does not have dentures.  Eyes: Conjunctivae and lids are normal. Pupils are equal, round, and reactive to light. No scleral icterus.  Left corneal clouding; lateral gaze palsy on left  Neck: Trachea normal and normal range of motion. Neck supple. Carotid bruit is not present. No thyroid mass and no thyromegaly present.  Cardiovascular: Normal rate, regular rhythm, normal heart sounds and intact distal pulses.  Exam reveals no gallop and no friction rub.   No murmur heard. no distal LE swelling. No calf TTP  Pulmonary/Chest: Effort normal and breath sounds normal. He has no wheezes. He has no rhonchi. He has no rales. He exhibits no tenderness. Right breast exhibits no inverted nipple, no mass, no nipple discharge, no skin change and no tenderness. Left breast exhibits no inverted nipple, no mass, no nipple discharge, no skin change and no tenderness. Breasts are symmetrical.    Abdominal: Soft. Normal appearance, normal aorta and bowel sounds are normal. He exhibits no distension, no abdominal bruit, no pulsatile midline mass and no mass. There is no hepatosplenomegaly. There is no tenderness. There is no rigidity, no rebound and no guarding. No hernia.  Genitourinary:  Deferred to urology  Musculoskeletal: Normal range of motion.  Lymphadenopathy:       Head (right side): No posterior auricular  adenopathy present.       Head (left side): No posterior auricular adenopathy present.    He has no cervical adenopathy.       Right: No supraclavicular adenopathy present.       Left: No supraclavicular adenopathy present.  Neurological: He is alert and oriented to person, place, and time. He has normal strength and normal reflexes. No cranial nerve deficit. Gait normal.  Skin: Skin is warm, dry and intact. No rash noted. Nails show no clubbing.  Psychiatric: He has a normal mood and affect. His speech is normal and behavior is normal. Judgment and thought content normal. Cognition and memory are normal.    Labs reviewed: Basic Metabolic Panel:  Recent Labs  05/19/15 1343  NA 141  K 4.5  CL 103  CO2 18  GLUCOSE 112*  BUN 17  CREATININE 0.97  CALCIUM 9.6  TSH 1.040   Liver Function Tests:  Recent Labs  05/19/15 1343  AST 23  ALT 15  ALKPHOS 89  BILITOT 0.4  PROT 7.2  ALBUMIN 4.2   No results for input(s): LIPASE, AMYLASE in the last 8760 hours. No results for input(s): AMMONIA in the last 8760 hours. CBC:  Recent Labs  05/19/15 1343  WBC 4.5  NEUTROABS 2.2  HCT 42.0  MCV 88  PLT 238   Lipid Panel:  Recent Labs  05/19/15 1343  CHOL 236*  HDL 70  LDLCALC 149*  TRIG 84  CHOLHDL 3.4   Lab Results  Component Value Date   HGBA1C 6.4* 05/19/2015    Procedures: No results found.  ECG OBTAINED AND  REVIEWED BY MYSELF: NSR @ 72 bpm, LAD, LAE, poor R wave progression. No acute ischemic changes. No other ECG available to compare  Assessment/Plan   ICD-9-CM ICD-10-CM   1. Well adult exam V70.0 Z00.00 EKG 12-Lead  2. Hyperlipidemia 272.4 99991111 Basic Metabolic Panel     ALT     Lipid Panel     EKG 12-Lead  3. Hyperglycemia 790.29 123456 Basic Metabolic Panel     Hemoglobin A1c  4. Gynecomastia, male - medication induced (prostate CA med) 611.1 123456 Basic Metabolic Panel     ALT  5. Prostate cancer ([place]) [place]   6. Obesity 278.00 E66.9     Send  cologuard for colon cancer screening  Pt is UTD on health maintenance. Vaccinations are UTD. Pt maintains a healthy lifestyle. Encouraged pt to exercise 30-45 minutes 4-5 times per week. Eat a well balanced diet. Avoid smoking. Limit alcohol intake. Wear seatbelt when riding in the car. Wear sun block (SPF >50) when spending extended times outside.  Continue current medications as ordered  Follow up in 4 mos for routine visit. Fasting labs prior to appt   [place] S. Perlie Gold  [facility] and Adult Medicine [address] [place], [place] [address] [phone] Cell (Monday-Friday 8 AM - 5 PM) [phone] After 5 PM and follow prompts

## 2015-05-28 DIAGNOSIS — C61 Malignant neoplasm of prostate: Secondary | ICD-10-CM | POA: Diagnosis not present

## 2015-06-03 DIAGNOSIS — Z1212 Encounter for screening for malignant neoplasm of rectum: Secondary | ICD-10-CM | POA: Diagnosis not present

## 2015-06-03 DIAGNOSIS — Z1211 Encounter for screening for malignant neoplasm of colon: Secondary | ICD-10-CM | POA: Diagnosis not present

## 2015-06-03 LAB — COLOGUARD: COLOGUARD: NEGATIVE

## 2015-06-16 ENCOUNTER — Encounter: Payer: Self-pay | Admitting: *Deleted

## 2015-07-29 DIAGNOSIS — C61 Malignant neoplasm of prostate: Secondary | ICD-10-CM | POA: Diagnosis not present

## 2015-08-05 DIAGNOSIS — C61 Malignant neoplasm of prostate: Secondary | ICD-10-CM | POA: Diagnosis not present

## 2015-08-14 DIAGNOSIS — C61 Malignant neoplasm of prostate: Secondary | ICD-10-CM | POA: Diagnosis not present

## 2015-08-14 DIAGNOSIS — Z5111 Encounter for antineoplastic chemotherapy: Secondary | ICD-10-CM | POA: Diagnosis not present

## 2015-09-23 NOTE — Addendum Note (Signed)
Addended by: Logan Bores on: 09/23/2015 03:29 PM   Modules accepted: Orders

## 2015-09-24 ENCOUNTER — Other Ambulatory Visit: Payer: Commercial Managed Care - HMO

## 2015-09-24 DIAGNOSIS — R739 Hyperglycemia, unspecified: Secondary | ICD-10-CM

## 2015-09-24 DIAGNOSIS — N62 Hypertrophy of breast: Secondary | ICD-10-CM

## 2015-09-24 DIAGNOSIS — E785 Hyperlipidemia, unspecified: Secondary | ICD-10-CM

## 2015-09-25 LAB — LIPID PANEL
CHOL/HDL RATIO: 2.4 ratio (ref <=5.0)
CHOLESTEROL: 150 mg/dL (ref 125–200)
HDL: 62 mg/dL (ref >=40)
LDL Cholesterol: 70 mg/dL (ref <130)
TRIGLYCERIDES: 89 mg/dL (ref <150)
VLDL: 18 mg/dL (ref <30)

## 2015-09-25 LAB — BASIC METABOLIC PANEL
BUN: 14 mg/dL (ref 7–25)
CALCIUM: 8.9 mg/dL (ref 8.6–10.3)
CO2: 19 mmol/L — ABNORMAL LOW (ref 20–31)
Chloride: 106 mmol/L (ref 98–110)
Creat: 0.84 mg/dL (ref 0.70–1.18)
Glucose, Bld: 110 mg/dL — ABNORMAL HIGH (ref 65–99)
Potassium: 4 mmol/L (ref 3.5–5.3)
Sodium: 138 mmol/L (ref 135–146)

## 2015-09-25 LAB — ALT: ALT: 15 U/L (ref 9–46)

## 2015-09-25 LAB — HEMOGLOBIN A1C
Hgb A1c MFr Bld: 6.7 % — ABNORMAL HIGH (ref <5.7)
Mean Plasma Glucose: 146 mg/dL

## 2015-09-26 ENCOUNTER — Ambulatory Visit (INDEPENDENT_AMBULATORY_CARE_PROVIDER_SITE_OTHER): Payer: Commercial Managed Care - HMO | Admitting: Internal Medicine

## 2015-09-26 ENCOUNTER — Encounter: Payer: Self-pay | Admitting: Internal Medicine

## 2015-09-26 VITALS — BP 126/78 | HR 83 | Temp 97.5°F | Ht 69.0 in | Wt 261.2 lb

## 2015-09-26 DIAGNOSIS — C61 Malignant neoplasm of prostate: Secondary | ICD-10-CM | POA: Diagnosis not present

## 2015-09-26 DIAGNOSIS — E785 Hyperlipidemia, unspecified: Secondary | ICD-10-CM

## 2015-09-26 DIAGNOSIS — G5603 Carpal tunnel syndrome, bilateral upper limbs: Secondary | ICD-10-CM

## 2015-09-26 DIAGNOSIS — E1141 Type 2 diabetes mellitus with diabetic mononeuropathy: Secondary | ICD-10-CM

## 2015-09-26 DIAGNOSIS — E669 Obesity, unspecified: Secondary | ICD-10-CM | POA: Diagnosis not present

## 2015-09-26 NOTE — Progress Notes (Signed)
Patient ID: Samuel [name], male   DOB: 12-May-1945, 70 y.o.   MRN: [id number]    Location:  [facility] Place of Service: OFFICE  Chief Complaint  Patient presents with  . Medical Management of Chronic Issues    4 months follow up    HPI:  70 yo male seen today for f/u. He c/o numbness in right thumb. 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 Jobs req'd repetitive hand motions. He has difficulty grasping with right hand but not dropping objects. He had cyst lanced from left wrist several mos ago at ED. He did not f/u with hand specialist as instructed.  Hx prostate CA - 1st dx in 2003 and was tx with external beam XRT. It recurred in 2009 and has been getting hormone injections q43mos -->q65mos currently. He also takes bicalutamide 50mg  daily for suppression. nocturia 3-4 times per night. Followed by urology Dr Junious Silk. 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 He does not take any meds. Takes ASA daily. He does not check BS at home  Hyperlipidemia - he resumed lipitor. Exercises 3-4 times per week (walking, squats, sit ups). LDL 70, total cholesterol 150; HDL 62  Knee DJD - s/p b/l TKR. Followed by Ortho Dr Rhona Raider at [facility] in 2015 in [place] per pt. cologuard in April 2017 neg  Past Medical History:  Diagnosis Date  . Blind left eye   . Cyst in hand 10/2013   left hand, treated with antibiotic & pain medicine  . Diabetes mellitus without complication ([facility])    ?? borderline  being checked  . Hepatitis A    25 YRS AGO  . Prostate cancer ([place])   . Sleep apnea    ????   O2 DESAT  10/26/13  IN ED      Past Surgical History:  Procedure Laterality Date  . EYE SURGERY     LEFT AYE AS CHILD   . JOINT REPLACEMENT     LT KNEE 3 YRS AGO   . 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 TOTAL KNEE ARTHROPLASTY Right 12/04/2013   Procedure: TOTAL  KNEE ARTHROPLASTY;  Surgeon: Hessie Dibble, MD;  Location: [facility];  Service: Orthopedics;  Laterality: Right;    Patient Care Team: [name], DO as PCP - General (Internal Medicine) Festus Aloe, MD as Consulting Physician (Urology)  Social History   Social History  . Marital status: Significant Other    Spouse name: N/A  . Number of children: N/A  . Years of education: N/A   Occupational History  . Not on file.   Social History Main Topics  . Smoking status: Former Smoker    Packs/day: 0.50    Years: 5.00    Types: Cigarettes    Quit date: 02/16/1983  . Smokeless tobacco: Never Used  . Alcohol use No  . Drug use: No  . Sexual activity: Not on file   Other Topics Concern  . 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 His smoking use included Cigarettes. He has a 2.50 pack-year smoking history. He has never used smokeless tobacco. He reports that he does not drink alcohol or use drugs.  Family History  Problem Relation Age of Onset  . Stroke Mother   . Heart attack Mother   . Diabetes Mother   . Stroke Father   . Stomach cancer Brother   . Prostate cancer Brother   . Throat cancer Sister    Family Status  Relation Status  . Mother Deceased at age 5  . Sister Deceased  . Daughter Alive  . Father Deceased at age 82  . Brother Alive  . Sister Deceased  . Brother Deceased  . Brother Deceased  . Sister  Deceased  . Brother Alive  . Sister Alive  . Sister Alive  . Brother Alive  . Daughter Alive     No Known Allergies  Medications: Patient's Medications  New Prescriptions   No medications on file  Previous Medications   ASPIRIN 325 MG TABLET    Take 325 mg by mouth daily.   ATORVASTATIN (LIPITOR) 10 MG TABLET    Take 1 tablet (10 mg total) by mouth every morning. Reported on 04/09/2015  Modified Medications   No medications on file  Discontinued Medications   BICALUTAMIDE (CASODEX) 50 MG TABLET    Take 150 mg by mouth every morning.    Review of Systems  Eyes: Positive for visual disturbance.  Genitourinary: Positive for frequency.  Neurological: Positive for numbness.  All other systems reviewed and are negative.   Vitals:   09/26/15 1335  BP: 126/78  Pulse: 83  Temp: 97.5 F (36.4 C)  TempSrc: Oral  SpO2: 96%  Weight: 261 lb 3.2 oz (118.5 kg)  Height: 5\' 9"  (1.753 m)   Body mass index is 38.57 kg/m.  Physical Exam  Constitutional: He is oriented to person, place, and time. He appears well-developed and well-nourished.  HENT:  Mouth/Throat: Oropharynx is clear and moist.  Lower dentures  Eyes: Pupils are equal, round, and reactive to light. Right eye exhibits no discharge. Left eye exhibits no discharge. No scleral icterus.  Left corneal clouding  Neck: Neck supple. Carotid bruit is not present.  Cardiovascular: Normal rate, regular rhythm, normal heart sounds and intact distal pulses.  Exam reveals no gallop and no friction rub.   No murmur heard. no distal LE swelling. No calf TTP  Pulmonary/Chest: Effort normal and breath sounds normal. He has no wheezes. He has no rales. He exhibits no tenderness. Breasts are asymmetrical.    Abdominal: Soft. Bowel sounds are normal. He exhibits no distension, no abdominal bruit, no pulsatile midline mass and no mass. There is no tenderness. There is no rebound and no guarding.  Musculoskeletal: He exhibits edema.  L>R  (+) Tinel's. Min MCP joint swelling R>L hand. B/l wrist swelling with reduced ROM  Lymphadenopathy:    He has no cervical adenopathy.  Neurological:  He is alert and oriented to person, place, and time.  Skin: Skin is warm and dry. No rash noted.  Psychiatric: He has a normal mood and affect. His behavior is normal. Judgment and thought content normal.   Diabetic Foot Exam - Simple   Simple Foot Form Diabetic Foot exam was performed with the following findings:  Yes 09/26/2015  4:48 PM  Visual Inspection See comments:  Yes Sensation Testing Intact to touch and monofilament testing bilaterally:  Yes Pulse Check Posterior Tibialis and Dorsalis pulse intact bilaterally:  Yes Comments Left plantar callus formation but no ulceration. Toenail dystrophy b/l      Labs reviewed: Appointment on 09/24/2015  Component Date Value Ref Range Status  . Sodium 09/25/2015 138  135 - 146 mmol/L Final  . Potassium 09/25/2015 4.0  3.5 - 5.3 mmol/L Final  . Chloride 09/25/2015 106  98 - 110 mmol/L Final  . CO2 09/25/2015 19* 20 - 31 mmol/L Final  . Glucose, Bld 09/25/2015 110* 65 - 99 mg/dL Final  . BUN 09/25/2015 14  7 - 25 mg/dL Final  . Creat 09/25/2015 0.84  0.70 - 1.18 mg/dL Final   Comment:   For patients > or = 71 years of age: The upper reference limit for Creatinine is approximately 13% higher for people identified as African-[name].     . Calcium 09/25/2015 8.9  8.6 - 10.3 mg/dL Final  . ALT 09/25/2015 15  9 - 46 U/L Final  . Hgb A1c MFr Bld 09/25/2015 6.7* <5.7 % Final   Comment:   For someone without known diabetes, a hemoglobin A1c value of 6.5% or greater indicates that they may have diabetes and this should be confirmed with a follow-up test.   For someone with known diabetes, a value <7% indicates that their diabetes is well controlled and a value greater than or equal to 7% indicates suboptimal control. A1c targets should be individualized based on duration of diabetes,  age, comorbid conditions, and other considerations.   Currently, no consensus exists for use of hemoglobin A1c for diagnosis of diabetes for children.     . Mean Plasma Glucose 09/25/2015 146  mg/dL Final  . Cholesterol 09/25/2015 150  125 - 200 mg/dL Final  . Triglycerides 09/25/2015 89  <150 mg/dL Final  . HDL 09/25/2015 62  >=40 mg/dL Final  . Total CHOL/HDL Ratio 09/25/2015 2.4  <=5.0 Ratio Final  . VLDL 09/25/2015 18  <30 mg/dL Final  . LDL Cholesterol 09/25/2015 70  <130 mg/dL Final   Comment:   Total Cholesterol/HDL Ratio:CHD Risk                        Coronary Heart Disease Risk Table                                        Men       Women          1/2 Average Risk              3.4        3.3              Average Risk              5.0        4.4           2X Average Risk  9.6        7.1           3X Average Risk             23.4       11.0 Use the calculated Patient Ratio above and the CHD Risk table  to determine the patient's CHD Risk.     No results found.   Assessment/Plan   ICD-9-CM ICD-10-CM   1. Type 2 diabetes mellitus with diabetic mononeuropathy, without long-term current use of insulin (HCC) 250.60 E11.41 Microalbumin/Creatinine Ratio, Urine   355.9    2. Bilateral carpal tunnel syndrome 354.0 G56.03   3. Hyperlipidemia 272.4 E78.5   4. Obesity 278.00 E66.9   5. Prostate cancer [facility]) 185 C61     May need carpal tunnel brace if symptoms worsen. May need ortho evaluation also  Continue current medications as ordered  Recommend diabetic education class. He prefers weight loss on own first.  Will call with lab results  Follow up in 4 mos for routine visit. Fasting labs prior to appt (bmp, alt, a1c, lipid panel)    Ketzia Guzek S. Perlie Gold  [facility] [address] [place], [place] [address] [phone] Cell (Monday-Friday 8 AM - 5 PM) [phone] After 5 PM and follow prompts

## 2015-09-26 NOTE — Patient Instructions (Addendum)
May need carpal tunnel brace if symptoms worsen. May need ortho evaluation also  Continue current medications as ordered  Recommend diabetic education class. He prefers weight loss on own first.  Will call with lab results  Follow up in 4 mos for routine visit. Fasting labs prior to appt   Carpal Tunnel Syndrome Carpal tunnel syndrome is a condition that causes pain in your hand and arm. The carpal tunnel is a narrow area that is on the palm side of your wrist. Repeated wrist motion or certain diseases may cause swelling in the tunnel. This swelling can pinch the main nerve in the wrist (median nerve).  HOME CARE If You Have a Splint:  Wear it as told by your doctor. Remove it only as told by your doctor.  Loosen the splint if your fingers:  Become numb and tingle.  Turn blue and cold.  Keep the splint clean and dry. General Instructions  Take over-the-counter and prescription medicines only as told by your doctor.  Rest your wrist from any activity that may be causing your pain. If needed, talk to your employer about changes that can be made in your work, such as getting a wrist pad to use while typing.  If directed, apply ice to the painful area:  Put ice in a plastic bag.  Place a towel between your skin and the bag.  Leave the ice on for 20 minutes, 2-3 times per day.  Keep all follow-up visits as told by your doctor. This is important.  Do any exercises as told by your doctor, physical therapist, or occupational therapist. GET HELP IF:  You have new symptoms.  Medicine does not help your pain.  Your symptoms get worse.   This information is not intended to replace advice given to you by your health care provider. Make sure you discuss any questions you have with your health care provider.   Document Released: 01/21/2011 Document Revised: 10/23/2014 Document Reviewed: 06/19/2014 Elsevier Interactive Patient Education [employer].

## 2015-09-27 LAB — MICROALBUMIN / CREATININE URINE RATIO
CREATININE, URINE: 159 mg/dL (ref 20–370)
MICROALB UR: 0.6 mg/dL
Microalb Creat Ratio: 4 mcg/mg creat (ref <30)

## 2015-11-12 DIAGNOSIS — C61 Malignant neoplasm of prostate: Secondary | ICD-10-CM | POA: Diagnosis not present

## 2015-11-19 ENCOUNTER — Other Ambulatory Visit: Payer: Self-pay | Admitting: Urology

## 2015-11-19 DIAGNOSIS — C61 Malignant neoplasm of prostate: Secondary | ICD-10-CM

## 2015-11-19 DIAGNOSIS — R3912 Poor urinary stream: Secondary | ICD-10-CM | POA: Diagnosis not present

## 2015-11-21 ENCOUNTER — Telehealth: Payer: Self-pay | Admitting: Oncology

## 2015-11-21 ENCOUNTER — Encounter: Payer: Self-pay | Admitting: Oncology

## 2015-11-21 NOTE — Telephone Encounter (Signed)
Appt scheduled w/Shadad for 10/24 @11am . Pt agreed to appt date and time. Demographics verified. Location given. Letter to the referring.

## 2015-12-02 ENCOUNTER — Encounter (HOSPITAL_COMMUNITY)
Admission: RE | Admit: 2015-12-02 | Discharge: 2015-12-02 | Disposition: A | Discharge status: Home / Self Care | Payer: Commercial Managed Care - HMO | Source: Ambulatory Visit | Attending: Urology | Admitting: Urology

## 2015-12-02 ENCOUNTER — Ambulatory Visit (HOSPITAL_COMMUNITY)
Admission: RE | Admit: 2015-12-02 | Discharge: 2015-12-02 | Disposition: A | Discharge status: Home / Self Care | Payer: Commercial Managed Care - HMO | Source: Ambulatory Visit | Attending: Urology | Admitting: Urology

## 2015-12-02 ENCOUNTER — Encounter (HOSPITAL_COMMUNITY): Payer: Self-pay

## 2015-12-02 DIAGNOSIS — C61 Malignant neoplasm of prostate: Secondary | ICD-10-CM | POA: Diagnosis not present

## 2015-12-02 DIAGNOSIS — I700 Atherosclerosis of aorta: Secondary | ICD-10-CM | POA: Diagnosis not present

## 2015-12-02 DIAGNOSIS — I708 Atherosclerosis of other arteries: Secondary | ICD-10-CM | POA: Insufficient documentation

## 2015-12-02 MED ORDER — IOPAMIDOL (ISOVUE-300) INJECTION 61%
100.0000 mL | Freq: Once | INTRAVENOUS | Status: AC | PRN
  Administered 2015-12-02: 100 mL via INTRAVENOUS

## 2015-12-02 MED ORDER — TECHNETIUM TC 99M MEDRONATE IV KIT
19.4000 | PACK | Freq: Once | INTRAVENOUS | Status: AC | PRN
  Administered 2015-12-02: 19.4 via INTRAVENOUS

## 2015-12-09 ENCOUNTER — Encounter: Payer: Self-pay | Admitting: Medical Oncology

## 2015-12-09 ENCOUNTER — Ambulatory Visit (HOSPITAL_BASED_OUTPATIENT_CLINIC_OR_DEPARTMENT_OTHER): Payer: Commercial Managed Care - HMO | Admitting: Oncology

## 2015-12-09 ENCOUNTER — Telehealth: Payer: Self-pay | Admitting: Oncology

## 2015-12-09 VITALS — BP 158/86 | HR 76 | Temp 97.6°F | Resp 18 | Ht 69.0 in | Wt 265.2 lb

## 2015-12-09 DIAGNOSIS — Z800 Family history of malignant neoplasm of digestive organs: Secondary | ICD-10-CM | POA: Diagnosis not present

## 2015-12-09 DIAGNOSIS — C61 Malignant neoplasm of prostate: Secondary | ICD-10-CM | POA: Diagnosis not present

## 2015-12-09 DIAGNOSIS — Z87891 Personal history of nicotine dependence: Secondary | ICD-10-CM

## 2015-12-09 DIAGNOSIS — Z8042 Family history of malignant neoplasm of prostate: Secondary | ICD-10-CM

## 2015-12-09 DIAGNOSIS — Z808 Family history of malignant neoplasm of other organs or systems: Secondary | ICD-10-CM

## 2015-12-09 DIAGNOSIS — E291 Testicular hypofunction: Secondary | ICD-10-CM

## 2015-12-09 NOTE — Progress Notes (Signed)
Reason for Referral: Prostate cancer.   HPI: 70 year old gentleman currently of [place] where he lived the majority of his life. He is a gentleman in reasonably good health except for mild obesity and hyperlipidemia. He was diagnosed with prostate cancer in 2003 while living in [place]. The details of his therapy and diagnosis is unclear to me. His report, he received definitive radiation therapy. He had recurrence of his cancer in 2010 and a repeat biopsy showed a Gleason score 4+4 = 8 with a PSA of 8.97. He was started on [facility] agonist as well as bicalutamide. Most recently, his PSA started to rise. In June 2016 his PSA was 6.11. His PSA was up to 7.7 in March 2016. PSA was up to 10.02 in December 2016. In June 2017 his PSA was 9.9 and his last PSA on 11/12/2015 was up to 12.6. At that time his phlebotomy was discontinued and staging workup was performed under the care of Dr. Junious Silk. CT scan and a bone scan obtained on 12/02/2015 showed no evidence of recurrent disease. Patient referred to me for evaluation regarding these findings. Clinically he is asymptomatic. He does have urine flow issues which have improved with Flomax. He denied any chest pain, back pain or difficulty urinating. He denied any weight loss or constitutional symptoms he denied any fatigue or tiredness or decline in his quality of life.  He does not report any headaches, blurry vision, syncope or seizures. He does not report any fevers, chills, sweats or weight loss. He does not report any chest pain, palpitation, orthopnea or leg edema. He does not report any cough, wheezing or hemoptysis. He does not report any nausea, vomiting or abdominal pain. He does not report any frequency urgency or hesitancy. Does not report a skeletal complaints. Remaining review of systems unremarkable.   Past Medical History:  Diagnosis Date  . Blind left eye   . Cyst in hand 10/2013   left hand, treated with antibiotic & pain medicine  . Diabetes  mellitus without complication ([place])    ?? borderline  being checked  . Hepatitis A    25 YRS AGO  . Prostate cancer ([place])   . Sleep apnea    ????   O2 DESAT  10/26/13  IN ED    :  Past Surgical History:  Procedure Laterality Date  . EYE SURGERY     LEFT AYE AS CHILD   . JOINT REPLACEMENT     LT KNEE 3 YRS AGO   . 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 TOTAL KNEE ARTHROPLASTY Right 12/04/2013   Procedure: TOTAL KNEE ARTHROPLASTY;  Surgeon: Hessie Dibble, MD;  Location: [facility];  Service: Orthopedics;  Laterality: Right;  :   Current Outpatient Prescriptions:  .  aspirin 325 MG tablet, Take 325 mg by mouth daily., Disp: , Rfl:  .  atorvastatin (LIPITOR) 10 MG tablet, Take 1 tablet (10 mg total) by mouth every morning. Reported on 04/09/2015, Disp: 90 tablet, Rfl: 2 .  bicalutamide (CASODEX) 50 MG tablet, Take 50 mg by mouth daily., Disp: , Rfl:  .  tamsulosin (FLOMAX) 0.4 MG CAPS capsule, Take 0.4 mg by mouth daily., Disp: , Rfl: :  Allergies  Allergen Reactions  . Other Other (See Comments)    Patient stated,"if I touch felt (material) I get "knots" on my body."  . Peach [Prunus Persica] Other (See Comments)    Patient stated,"if I touch or eat peach fuzz, I get "knots"  all over my face and mouth."   :  Family History  Problem Relation Age of Onset  . Stroke Mother   . Heart attack Mother   . Diabetes Mother   . Stroke Father   . Stomach cancer Brother   . Prostate cancer Brother   . Throat cancer Sister   :  Social History   Social History  . Marital status: Widowed    Spouse name: N/A  . Number of children: N/A  . Years of education: N/A   Occupational History  . Not on file.   Social History Main Topics  . Smoking status: Former Smoker    Packs/day: 0.50    Years: 5.00    Types: Cigarettes    Quit date: 02/16/1983  . Smokeless tobacco: Never Used  . Alcohol use No  . Drug use: No  . Sexual activity: Not on file   Other Topics  Concern  . 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  Exam: Blood pressure (!) 158/86, pulse 76, temperature 97.6 F (36.4 C), temperature source Oral, resp. rate 18, height 5\' 9"  (1.753 m), weight 265 lb 3.2 oz (120.3 kg), SpO2 98 %. General appearance: alert and cooperative Head: Normocephalic, without obvious abnormality Throat: lips, mucosa, and tongue normal; teeth and gums normal Neck: no adenopathy Back: negative Resp: clear to auscultation bilaterally Chest wall: no tenderness Cardio: regular rate and rhythm, S1, S2 normal, no murmur, click, rub or gallop GI: soft, non-tender; bowel sounds normal; no masses,  no organomegaly Extremities: extremities normal, atraumatic, no cyanosis or edema Skin: Skin color, texture, turgor normal. No rashes or lesions  CBC    Component Value Date/Time   WBC 4.5 05/19/2015 1343   WBC 8.8 12/06/2013 0500   RBC 4.75 05/19/2015 1343   RBC 4.07 (L) 12/06/2013 0500   HGB 12.1 (L) 12/06/2013 0500   HCT 42.0 05/19/2015 1343   PLT 238 05/19/2015 1343   MCV 88 05/19/2015 1343   MCH 30.5 05/19/2015 1343   MCH 29.7 12/06/2013 0500   MCHC 34.5 05/19/2015 1343   MCHC 32.7 12/06/2013 0500   RDW 13.3 05/19/2015 1343   LYMPHSABS 1.9 05/19/2015 1343   MONOABS 0.3 11/23/2013 1156   EOSABS 0.1 05/19/2015 1343   BASOSABS 0.0 05/19/2015 1343      Chemistry      Component Value Date/Time   NA 138 09/24/2015  1343   NA 141 05/19/2015 1343   K 4.0 09/24/2015 1343   CL 106 09/24/2015 1343   CO2 19 (L) 09/24/2015 1343   BUN 14 09/24/2015 1343   BUN 17 05/19/2015 1343   CREATININE 0.84 09/24/2015 1343  Component Value Date/Time   CALCIUM 8.9 09/24/2015 1343   ALKPHOS 89 05/19/2015 1343   AST 23 05/19/2015 1343   ALT 15 09/24/2015 1343   BILITOT 0.4 05/19/2015 1343       Ct Pelvis W Contrast  Result Date: 12/02/2015 CLINICAL DATA:  Prostate carcinoma. Previous radiation therapy. Rising serum PSA level, consistent with biochemical recurrence. EXAM: CT PELVIS WITH CONTRAST TECHNIQUE: Multidetector CT imaging of the pelvis was performed using the standard protocol following the bolus administration of intravenous contrast. CONTRAST:  128mL ISOVUE-300 IOPAMIDOL (ISOVUE-300) INJECTION 61% COMPARISON:  None. FINDINGS: Urinary Tract: Unremarkable urinary bladder. Bowel: Unremarkable pelvic bowel loops. Vascular/Lymphatic: No pathologically enlarged lymph nodes. Distal abdominal aortic and iliac artery atherosclerotic calcification noted. Reproductive: Small prostate gland and seminal vesicles. No mass identified. Other: None. Musculoskeletal: No significant abnormality identfied. Advanced degenerative spondylosis seen at L4-5 and L5-S1. IMPRESSION: No evidence of locally recurrent or metastatic prostate carcinoma within the pelvis. Aortic and bilateral iliac artery atherosclerosis. Electronically Signed   By: Earle Gell M.D.   On: 12/02/2015 17:08   Nm Bone Scan Whole Body  Result Date: 12/02/2015 CLINICAL DATA:  Prostate cancer. EXAM: NUCLEAR MEDICINE WHOLE BODY BONE SCAN TECHNIQUE: Whole body anterior and posterior images were obtained approximately 3 hours after intravenous injection of radiopharmaceutical. RADIOPHARMACEUTICALS:  19.4 MCi Technetium-82m MDP IV COMPARISON:  Chest x-ray 10/26/2013. FINDINGS: Bilateral renal function and excretion is present. Mild increased activity noted about both  shoulders and first sternoclavicular joints most likely degenerative. Mild increased activity noted over the maxilla most likely related to dental disease. No evidence of metastatic disease. IMPRESSION: No evidence of metastatic disease. Electronically Signed   By: Marcello Moores  Register   On: 12/02/2015 16:55    Assessment and Plan:   70 year old gentleman with the following issues:  1. Prostate cancer dating back to 2003. He was treated with external beam radiation and androgen deprivation. He developed recurrent disease in 2010 with a Gleason score 4+4 = 8 and PSA of 8.97. He has been on combined androgen deprivation with [place] agonist on bicalutamide. Over the last year, his PSA have been slowly rising and PSA in June 2016 was 6.11, in December 2016 was 10, in June 2017 was 9 and in September 2017 was up to 12. Staging workup including CT scan of the abdomen and pelvis as well as bone scan were personally reviewed and showed no evidence of metastatic disease.  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 He understands that eventually this cancer will be metastatic in nature and no curative options do exist. Any treatments moving forward would be palliative at that time. The fact that he has not had any measurable disease or symptoms related to his cancer is a positive sign of a slow growth in his malignancy. Options of treatment would include observation, Nicki Reaper, Provenge immunotherapy as well as systemic chemotherapy. At this time, he is in favor or observation and surveillance and using Xtandi if the develops any measurable disease or starts to have a rapid rise in his PSA.  I recommended short period of observation and repeat PSA in 2-3 months. Please PSA continues to have slow trajectory, continued observation will be warranted. If is a rapid rise in his PSA was detected, we will start second line  hormonal therapy.  2. Androgen depravation: I recommended continuing Superior agonist indefinitely.  3. Bone health: No evidence of bony metastatic  disease is noted. Recommended continued calcium and vitamin D supplements.  4. Follow-up: Will be in 2-3 months to repeat his PSA at that time.

## 2015-12-09 NOTE — Telephone Encounter (Signed)
AVS report and appointment schedule given to patient, per 12/09/15 los. °

## 2016-01-27 ENCOUNTER — Other Ambulatory Visit: Payer: Self-pay

## 2016-01-27 DIAGNOSIS — E1141 Type 2 diabetes mellitus with diabetic mononeuropathy: Secondary | ICD-10-CM

## 2016-01-27 DIAGNOSIS — E7849 Other hyperlipidemia: Secondary | ICD-10-CM

## 2016-01-28 ENCOUNTER — Other Ambulatory Visit: Payer: Commercial Managed Care - HMO

## 2016-01-28 DIAGNOSIS — E7849 Other hyperlipidemia: Secondary | ICD-10-CM

## 2016-01-28 DIAGNOSIS — E784 Other hyperlipidemia: Secondary | ICD-10-CM | POA: Diagnosis not present

## 2016-01-28 DIAGNOSIS — E1141 Type 2 diabetes mellitus with diabetic mononeuropathy: Secondary | ICD-10-CM | POA: Diagnosis not present

## 2016-01-29 LAB — BASIC METABOLIC PANEL WITH GFR
BUN: 18 mg/dL (ref 7–25)
CALCIUM: 9.3 mg/dL (ref 8.6–10.3)
CO2: 23 mmol/L (ref 20–31)
CREATININE: 0.92 mg/dL (ref 0.70–1.18)
Chloride: 105 mmol/L (ref 98–110)
GFR, Est Non African American: 83 mL/min (ref >=60)
GLUCOSE: 115 mg/dL — AB (ref 65–99)
Potassium: 4.4 mmol/L (ref 3.5–5.3)
Sodium: 138 mmol/L (ref 135–146)

## 2016-01-29 LAB — LIPID PANEL
CHOL/HDL RATIO: 2.5 ratio (ref <5.0)
Cholesterol: 161 mg/dL (ref <200)
HDL: 65 mg/dL (ref >40)
LDL CALC: 84 mg/dL (ref <100)
Triglycerides: 62 mg/dL (ref <150)
VLDL: 12 mg/dL (ref <30)

## 2016-01-29 LAB — HEMOGLOBIN A1C
Hgb A1c MFr Bld: 6.8 % — ABNORMAL HIGH (ref <5.7)
Mean Plasma Glucose: 148 mg/dL

## 2016-01-29 LAB — ALT: ALT: 15 U/L (ref 9–46)

## 2016-01-30 ENCOUNTER — Ambulatory Visit (INDEPENDENT_AMBULATORY_CARE_PROVIDER_SITE_OTHER): Payer: Commercial Managed Care - HMO | Admitting: Internal Medicine

## 2016-01-30 ENCOUNTER — Encounter: Payer: Self-pay | Admitting: Internal Medicine

## 2016-01-30 VITALS — BP 138/70 | HR 74 | Temp 97.6°F | Ht 69.0 in | Wt 266.0 lb

## 2016-01-30 DIAGNOSIS — E1141 Type 2 diabetes mellitus with diabetic mononeuropathy: Secondary | ICD-10-CM | POA: Diagnosis not present

## 2016-01-30 DIAGNOSIS — C61 Malignant neoplasm of prostate: Secondary | ICD-10-CM | POA: Diagnosis not present

## 2016-01-30 DIAGNOSIS — E6609 Other obesity due to excess calories: Secondary | ICD-10-CM | POA: Diagnosis not present

## 2016-01-30 DIAGNOSIS — Z6839 Body mass index (BMI) 39.0-39.9, adult: Secondary | ICD-10-CM

## 2016-01-30 DIAGNOSIS — E782 Mixed hyperlipidemia: Secondary | ICD-10-CM | POA: Diagnosis not present

## 2016-01-30 DIAGNOSIS — IMO0001 Reserved for inherently not codable concepts without codable children: Secondary | ICD-10-CM

## 2016-01-30 NOTE — Patient Instructions (Addendum)
Continue current medications as ordered  Recommend increase exercise as tolerated  Follow up with specialists as scheduled  Follow up in April as scheduled for AWV and CPE. Fasting labs prior to appt

## 2016-01-30 NOTE — Progress Notes (Signed)
Patient ID: Samuel [name], male   DOB: Feb 09, 1946, 70 y.o.   MRN: [id number]    Location:  [facility] Place of Service: OFFICE  Chief Complaint  Patient presents with  . Medical Management of Chronic Issues    4 months routine visit    HPI:  70 yo male seen today for f/u    He c/o numbness in right thumb. 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 Jobs req'd repetitive hand motions. He has difficulty grasping with right hand but not dropping objects. He had cyst lanced from left wrist several mos ago at ED. He did not f/u with hand specialist as instructed.  Hx prostate CA - 1st dx in 2003 and was tx with external beam XRT. It recurred in 2009 and has been getting hormone injections q32mos currently. He no longer takes bicalutamide 50mg  daily for suppression. He has nocturia 3-4 times per night. Followed by urology Dr Junious Silk. PSA 9.6 in June 2017 with total testosterone of 76.2. 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 He does not take any meds. Takes ASA daily. He does not check BS at home. Urine microalbumin/cr ratio 4.0  Hyperlipidemia - he resumed lipitor. Exercises 3-4 times per week (walking, squats, sit ups). LDL 84, total cholesterol 161; HDL 65  Knee DJD - s/p b/l TKR. Followed by Ortho Dr Rhona Raider at [place] colonoscopy in 2015 in [place] per pt. cologuard in April 2017 neg  Past Medical History:  Diagnosis Date  . Blind left eye   . Cyst in hand 10/2013   left hand, treated with antibiotic & pain medicine  . Diabetes mellitus without complication ([place])    ?? borderline  being checked  . Hepatitis A    25 YRS AGO  . Prostate cancer ([place])   . Sleep apnea    ????   O2 DESAT  10/26/13  IN ED      Past Surgical History:  Procedure Laterality Date  . EYE SURGERY     LEFT AYE AS CHILD   . JOINT REPLACEMENT     LT KNEE 3 YRS AGO   . TOTAL KNEE  ARTHROPLASTY Right 12/04/2013   dr Rhona Raider  . TOTAL KNEE ARTHROPLASTY Right 12/04/2013   Procedure: TOTAL KNEE ARTHROPLASTY;  Surgeon: Hessie Dibble, MD;  Location: [place];  Service: Orthopedics;  Laterality: Right;    Patient Care Team: [name], DO as PCP - General (Internal Medicine) Festus Aloe, MD as Consulting Physician (Urology)  Social History   Social History  . Marital status: Widowed    Spouse name: N/A  . Number of children: N/A  . Years of education: N/A   Occupational History  . Not on file.   Social History Main Topics  . Smoking status: Former Smoker    Packs/day: 0.50    Years: 5.00    Types: Cigarettes    Quit date: 02/16/1983  . Smokeless tobacco: Never Used  . Alcohol use No  . Drug use: No  . Sexual activity: Not on file   Other Topics Concern  . 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 His smoking use included Cigarettes. He has a 2.50 pack-year smoking history. He has never used smokeless tobacco. He reports that he does not drink alcohol or use drugs.  Family History  Problem Relation Age of Onset  . Stroke Mother   . Heart attack Mother   . Diabetes Mother   . Stroke Father   . Stomach cancer Brother   . Prostate cancer Brother   . Throat cancer Sister    Family Status  Relation Status  . Mother Deceased at age 71  . Sister Deceased  . Daughter Alive  . Father Deceased  at age 5  . Brother Alive  . Sister Deceased  . Brother Deceased  . Brother Deceased  . Sister Deceased  . Brother Alive  . Sister Alive  . Sister Alive  . Brother Alive  . Daughter Alive     Allergies  Allergen Reactions  . Other Other (See Comments)    Patient stated,"if I touch felt (material) I get "knots" on my body."  . Peach [Prunus Persica] Other (See Comments)    Patient stated,"if I touch or eat peach fuzz, I get "knots" all over my face and mouth."     Medications: Patient's Medications  New Prescriptions   No medications on file  Previous Medications   ASPIRIN 325 MG TABLET    Take 325 mg by mouth daily.   ATORVASTATIN (LIPITOR) 10 MG TABLET    Take 1 tablet (10 mg total) by mouth every morning. Reported on 04/09/2015   BICALUTAMIDE (CASODEX) 50 MG TABLET    Take 50 mg by mouth daily.   TAMSULOSIN (FLOMAX) 0.4 MG CAPS CAPSULE    Take 0.4 mg by mouth daily.   TRIPTORELIN (TRELSTAR) 11.25 MG INJECTION    Inject 11.25 mg into the muscle every 3 (three) months.  Modified Medications   No medications on file  Discontinued Medications   No medications on file    Review of Systems  Eyes: Positive for visual disturbance.  Genitourinary: Positive for frequency.  Neurological: Positive for numbness.  All other systems reviewed and are negative.   Vitals:   01/30/16 1422  BP: 138/70  Pulse: 74  Temp: 97.6 F (36.4 C)  TempSrc: Oral  SpO2: 97%  Weight: 266 lb (120.7 kg)  Height: 5\' 9"  (1.753 m)   Body mass index is 39.28 kg/m.  Physical Exam  Constitutional: He is oriented to person, place, and time. He appears well-developed and well-nourished.  HENT:  Mouth/Throat: Oropharynx is clear and moist.  Lower dentures  Eyes: Pupils are equal, round, and reactive to light. Right eye exhibits no discharge. Left eye exhibits no discharge. No scleral icterus.  Left corneal clouding  Neck: Neck supple. Carotid bruit is not present.  Cardiovascular: Normal  rate, regular rhythm, normal heart sounds and intact distal pulses.  Exam  reveals no gallop and no friction rub.   No murmur heard. no distal LE swelling. No calf TTP  Pulmonary/Chest: Effort normal and breath sounds normal. He has no wheezes. He has no rales. He exhibits no tenderness. Breasts are asymmetrical.    Abdominal: Soft. Bowel sounds are normal. He exhibits no distension, no abdominal bruit, no pulsatile midline mass and no mass. There is no tenderness. There is no rebound and no guarding.  Musculoskeletal: He exhibits edema.  L>R (+) Tinel's. Min MCP joint swelling R>L hand. B/l wrist swelling with reduced ROM  Lymphadenopathy:    He has no cervical adenopathy.  Neurological: He is alert and oriented to person, place, and time.  Skin: Skin is warm and dry. No rash noted.  Psychiatric: He has a normal mood and affect. His behavior is normal. Judgment and thought content normal.     Labs reviewed: Appointment on 01/28/2016  Component Date Value Ref Range Status  . Sodium 01/29/2016 138  135 - 146 mmol/L Final  . Potassium 01/29/2016 4.4  3.5 - 5.3 mmol/L Final  . Chloride 01/29/2016 105  98 - 110 mmol/L Final  . CO2 01/29/2016 23  20 - 31 mmol/L Final  . Glucose, Bld 01/29/2016 115* 65 - 99 mg/dL Final  . BUN 01/29/2016 18  7 - 25 mg/dL Final  . Creat 01/29/2016 0.92  0.70 - 1.18 mg/dL Final   Comment:   For patients > or = 71 years of age: The upper reference limit for Creatinine is approximately 13% higher for people identified as African-[name].     . Calcium 01/29/2016 9.3  8.6 - 10.3 mg/dL Final  . GFR, Est African [name] 01/29/2016 >89  >=60 mL/min Final  . GFR, Est Non African [name] 01/29/2016 83  >=60 mL/min Final  . ALT 01/29/2016 15  9 - 46 U/L Final  . Hgb A1c MFr Bld 01/29/2016 6.8* <5.7 % Final   Comment:   For someone without known diabetes, a hemoglobin A1c value of 6.5% or greater indicates that they may have diabetes and this should  be confirmed with a follow-up test.   For someone with known diabetes, a value <7% indicates that their diabetes is well controlled and a value greater than or equal to 7% indicates suboptimal control. A1c targets should be individualized based on duration of diabetes, age, comorbid conditions, and other considerations.   Currently, no consensus exists for use of hemoglobin A1c for diagnosis of diabetes for children.     . Mean Plasma Glucose 01/29/2016 148  mg/dL Final  . Cholesterol 01/29/2016 161  <200 mg/dL Final  . Triglycerides 01/29/2016 62  <150 mg/dL Final  . HDL 01/29/2016 65  >40 mg/dL Final  . Total CHOL/HDL Ratio 01/29/2016 2.5  <5.0 Ratio Final  . VLDL 01/29/2016 12  <30 mg/dL Final  . LDL Cholesterol 01/29/2016 84  <100 mg/dL Final    No results found.   Assessment/Plan   ICD-9-CM ICD-10-CM   1. Type 2 diabetes mellitus with diabetic mononeuropathy, without long-term current use of insulin (HCC) 250.60 E11.41    355.9    2. Mixed hyperlipidemia 272.2 E78.2   3. Class 2 obesity due to excess calories with serious comorbidity and body mass index (BMI) of 39.0 to 39.9 in adult 278.00 E66.09    V85.39 Z68.39   4. Prostate cancer ([place]) [place] current medications as ordered  Recommend increase exercise as tolerated  Follow up with specialists as scheduled  Follow up in April as scheduled for AWV and CPE. Fasting labs prior to appt  [place] S. Perlie Gold  [facility] [address] [place], [place] [address] [phone] Cell (Monday-Friday 8 AM - 5 PM) [phone] After 5 PM and follow prompts

## 2016-02-02 NOTE — Progress Notes (Signed)
Nav 

## 2016-02-24 ENCOUNTER — Other Ambulatory Visit: Payer: Self-pay | Admitting: *Deleted

## 2016-02-24 MED ORDER — ATORVASTATIN CALCIUM 10 MG PO TABS: 10.0000 mg | ORAL_TABLET | Freq: Every morning | ORAL | 2 refills | Status: DC

## 2016-02-24 NOTE — Telephone Encounter (Signed)
Patient requested to be faxed to pharmacy 

## 2016-02-27 ENCOUNTER — Other Ambulatory Visit (HOSPITAL_BASED_OUTPATIENT_CLINIC_OR_DEPARTMENT_OTHER): Payer: Commercial Managed Care - HMO

## 2016-02-27 DIAGNOSIS — C61 Malignant neoplasm of prostate: Secondary | ICD-10-CM | POA: Diagnosis not present

## 2016-02-27 LAB — CBC WITH DIFFERENTIAL/PLATELET
BASO%: 0.6 % (ref 0.0–2.0)
Basophils Absolute: 0 10*3/uL (ref 0.0–0.1)
EOS%: 0.8 % (ref 0.0–7.0)
Eosinophils Absolute: 0 10*3/uL (ref 0.0–0.5)
HEMATOCRIT: 40.6 % (ref 38.4–49.9)
HEMOGLOBIN: 13.6 g/dL (ref 13.0–17.1)
LYMPH#: 2.7 10*3/uL (ref 0.9–3.3)
LYMPH%: 45.9 % (ref 14.0–49.0)
MCH: 30.3 pg (ref 27.2–33.4)
MCHC: 33.5 g/dL (ref 32.0–36.0)
MCV: 90.3 fL (ref 79.3–98.0)
MONO#: 0.6 10*3/uL (ref 0.1–0.9)
MONO%: 9.9 % (ref 0.0–14.0)
NEUT%: 42.8 % (ref 39.0–75.0)
NEUTROS ABS: 2.5 10*3/uL (ref 1.5–6.5)
PLATELETS: 214 10*3/uL (ref 140–400)
RBC: 4.49 10*6/uL (ref 4.20–5.82)
RDW: 13.7 % (ref 11.0–14.6)
WBC: 5.8 10*3/uL (ref 4.0–10.3)

## 2016-02-27 LAB — COMPREHENSIVE METABOLIC PANEL
ALT: 17 U/L (ref 0–55)
AST: 22 U/L (ref 5–34)
Albumin: 3.9 g/dL (ref 3.5–5.0)
Alkaline Phosphatase: 105 U/L (ref 40–150)
Anion Gap: 9 mEq/L (ref 3–11)
BILIRUBIN TOTAL: 0.57 mg/dL (ref 0.20–1.20)
BUN: 15.3 mg/dL (ref 7.0–26.0)
CALCIUM: 9.4 mg/dL (ref 8.4–10.4)
CHLORIDE: 107 meq/L (ref 98–109)
CO2: 22 mEq/L (ref 22–29)
Creatinine: 0.9 mg/dL (ref 0.7–1.3)
EGFR: >90 mL/min/{1.73_m2} (ref >90)
Glucose: 109 mg/dl (ref 70–140)
Potassium: 4 mEq/L (ref 3.5–5.1)
Sodium: 138 mEq/L (ref 136–145)
TOTAL PROTEIN: 7.3 g/dL (ref 6.4–8.3)

## 2016-02-28 LAB — TESTOSTERONE: Testosterone, Serum: 33 ng/dL — ABNORMAL LOW (ref 264–916)

## 2016-02-28 LAB — PSA: PROSTATE SPECIFIC AG, SERUM: 16 ng/mL — AB (ref 0.0–4.0)

## 2016-03-02 ENCOUNTER — Ambulatory Visit (HOSPITAL_BASED_OUTPATIENT_CLINIC_OR_DEPARTMENT_OTHER): Payer: Commercial Managed Care - HMO | Admitting: Oncology

## 2016-03-02 ENCOUNTER — Telehealth: Payer: Self-pay | Admitting: Oncology

## 2016-03-02 VITALS — BP 157/80 | HR 76 | Temp 97.8°F | Resp 18 | Ht 69.0 in | Wt 265.3 lb

## 2016-03-02 DIAGNOSIS — E291 Testicular hypofunction: Secondary | ICD-10-CM | POA: Diagnosis not present

## 2016-03-02 DIAGNOSIS — C61 Malignant neoplasm of prostate: Secondary | ICD-10-CM

## 2016-03-02 NOTE — Telephone Encounter (Signed)
Appointments scheduled per 1/16 LOS. Patient given AVS report and calendars with future scheduled appointments. °

## 2016-03-02 NOTE — Progress Notes (Signed)
Hematology and Oncology Follow Up Visit  Samuel [name] [id number] 04/27/1945 71 y.o. 03/02/2016 10:41 AM Samuel [name], DOCarter, Samuel [name], [place]   Principle Diagnosis: 71 year old with prostate cancer. He had a Gleason score 4+4 = 8 and a PSA of 8.97. 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   Prior Therapy: He was treated with external beam radiation and androgen deprivation. He developed recurrent disease in 2010 with a Gleason score 4+4 = 8 and PSA of 8.97.  He has been on combined androgen deprivation with [place] agonist on bicalutamide. PSA in June 2016 was 6.11, in December 2016 was 10, in June 2017 was 9 and in September 2017 was up to 12. Staging workup including CT scan of the abdomen and pelvis as well as bone scan were personally reviewed and showed no evidence of metastatic disease.  Current therapy: Androgen Deprivation only and a consideration to start second line hormone therapy.  Interim History: Samuel [name] presents today for a follow-up visit. His last visit, he reports no recent complaints. He continues to live independently and attends to activities of daily living. He denied any bone pain, pathological fractures. He does not report any abdominal pain or difficulty breathing. Continues to be asymptomatic from his prostate cancer.  He does not report any headaches, blurry vision, syncope or seizures. He does not report any fevers, chills, sweats or weight loss. He does not report any chest pain, palpitation, orthopnea or leg edema. He does not report any cough, wheezing or hemoptysis. He does not report any nausea, vomiting or abdominal pain. He does not report any frequency urgency or hesitancy. Does not report a skeletal complaints. Remaining review of systems unremarkable.    Medications: I have reviewed the patient's current medications.  Current Outpatient Prescriptions  Medication Sig Dispense Refill  . aspirin 325 MG tablet Take 325 mg by mouth daily.     Marland Kitchen atorvastatin (LIPITOR) 10 MG tablet Take 1 tablet (10 mg total) by mouth every morning. 90 tablet 2  . tamsulosin (FLOMAX) 0.4 MG CAPS capsule Take 0.4 mg by mouth daily.    Marland Kitchen triptorelin (TRELSTAR) 11.25 MG injection Inject 11.25 mg into the muscle every 3 (three) months.     No current facility-administered medications for this visit.      Allergies:  Allergies  Allergen Reactions  . Other Other (See Comments)    Patient stated,"if I touch felt (material) I get "knots" on my body."  . Peach [Prunus Persica] Other (See Comments)    Patient stated,"if I touch or eat peach fuzz, I get "knots" all over my face and mouth."     Past Medical History, Surgical history, Social history, and Family History were reviewed and updated.   Physical Exam: Blood pressure (!) 157/80, pulse 76, temperature 97.8 F (36.6 C), temperature source Oral, resp. rate 18, height 5\' 9"  (1.753 m), weight 265 lb 4.8 oz (120.3 kg), SpO2 94 %. ECOG: 0 General appearance: alert and cooperative Head: Normocephalic, without obvious abnormality Neck: no adenopathy Lymph nodes: Cervical, supraclavicular, and axillary nodes normal. Heart:regular rate and rhythm, S1, S2 normal, no murmur, click, rub or gallop Lung:chest clear, no wheezing, rales, normal symmetric air entry Abdomin: soft, non-tender, without masses or organomegaly EXT:no erythema, induration, or nodules   Lab Results: Lab Results  Component Value Date   WBC 5.8 02/27/2016   HGB 13.6 02/27/2016   HCT 40.6 02/27/2016   MCV 90.3 02/27/2016   PLT 214 02/27/2016     Chemistry  Component Value Date/Time   NA 138 02/27/2016 1407   K 4.0 02/27/2016 1407   CL 105 01/28/2016 1334   CO2 22 02/27/2016 1407   BUN 15.3 02/27/2016 1407   CREATININE 0.9 02/27/2016 1407      Component Value Date/Time   CALCIUM 9.4 02/27/2016 1407   ALKPHOS 105 02/27/2016 1407   AST 22 02/27/2016 1407   ALT 17 02/27/2016 1407   BILITOT 0.57 02/27/2016 1407        Impression and Plan:   71 year old gentleman with the following issues:  1. Prostate cancer dating back to 2003. He was treated with external beam radiation and androgen deprivation. He developed recurrent disease in 2010 with a Gleason score 4+4 = 8 and PSA of 8.97. He was treated with combined androgen deprivation with excellent response initially.  He developed biochemical relapse disease with PSA up to 16 in January 2018. His PSA was 12 and September 2017. These findings were reviewed with the patient again today and appears to have biochemical relapse without any evidence of measurable disease. Risks and benefits of starting second line hormonal therapy were reviewed today and he opted to defer treatment to a later date. I'll he is reasonable to do so given the fact that his asymptomatic and he has no measurable disease. I recommended continuing following and repeat PSA in 3 months.  2. Androgen depravation: I have recommended to continue that indefinitely.  3. Follow-up: Will be in 3 months to repeat his PSA.  [facility], MD 1/16/201810:41 AM

## 2016-03-08 DIAGNOSIS — C61 Malignant neoplasm of prostate: Secondary | ICD-10-CM | POA: Diagnosis not present

## 2016-03-09 IMAGING — CR DG CHEST 2V
2 series · 2 of 2 positions shown · non-contrast
Comparison: 08/21/2013

CLINICAL DATA: Nausea, vomiting

EXAM:
CHEST  2 VIEW

[w chest pa]
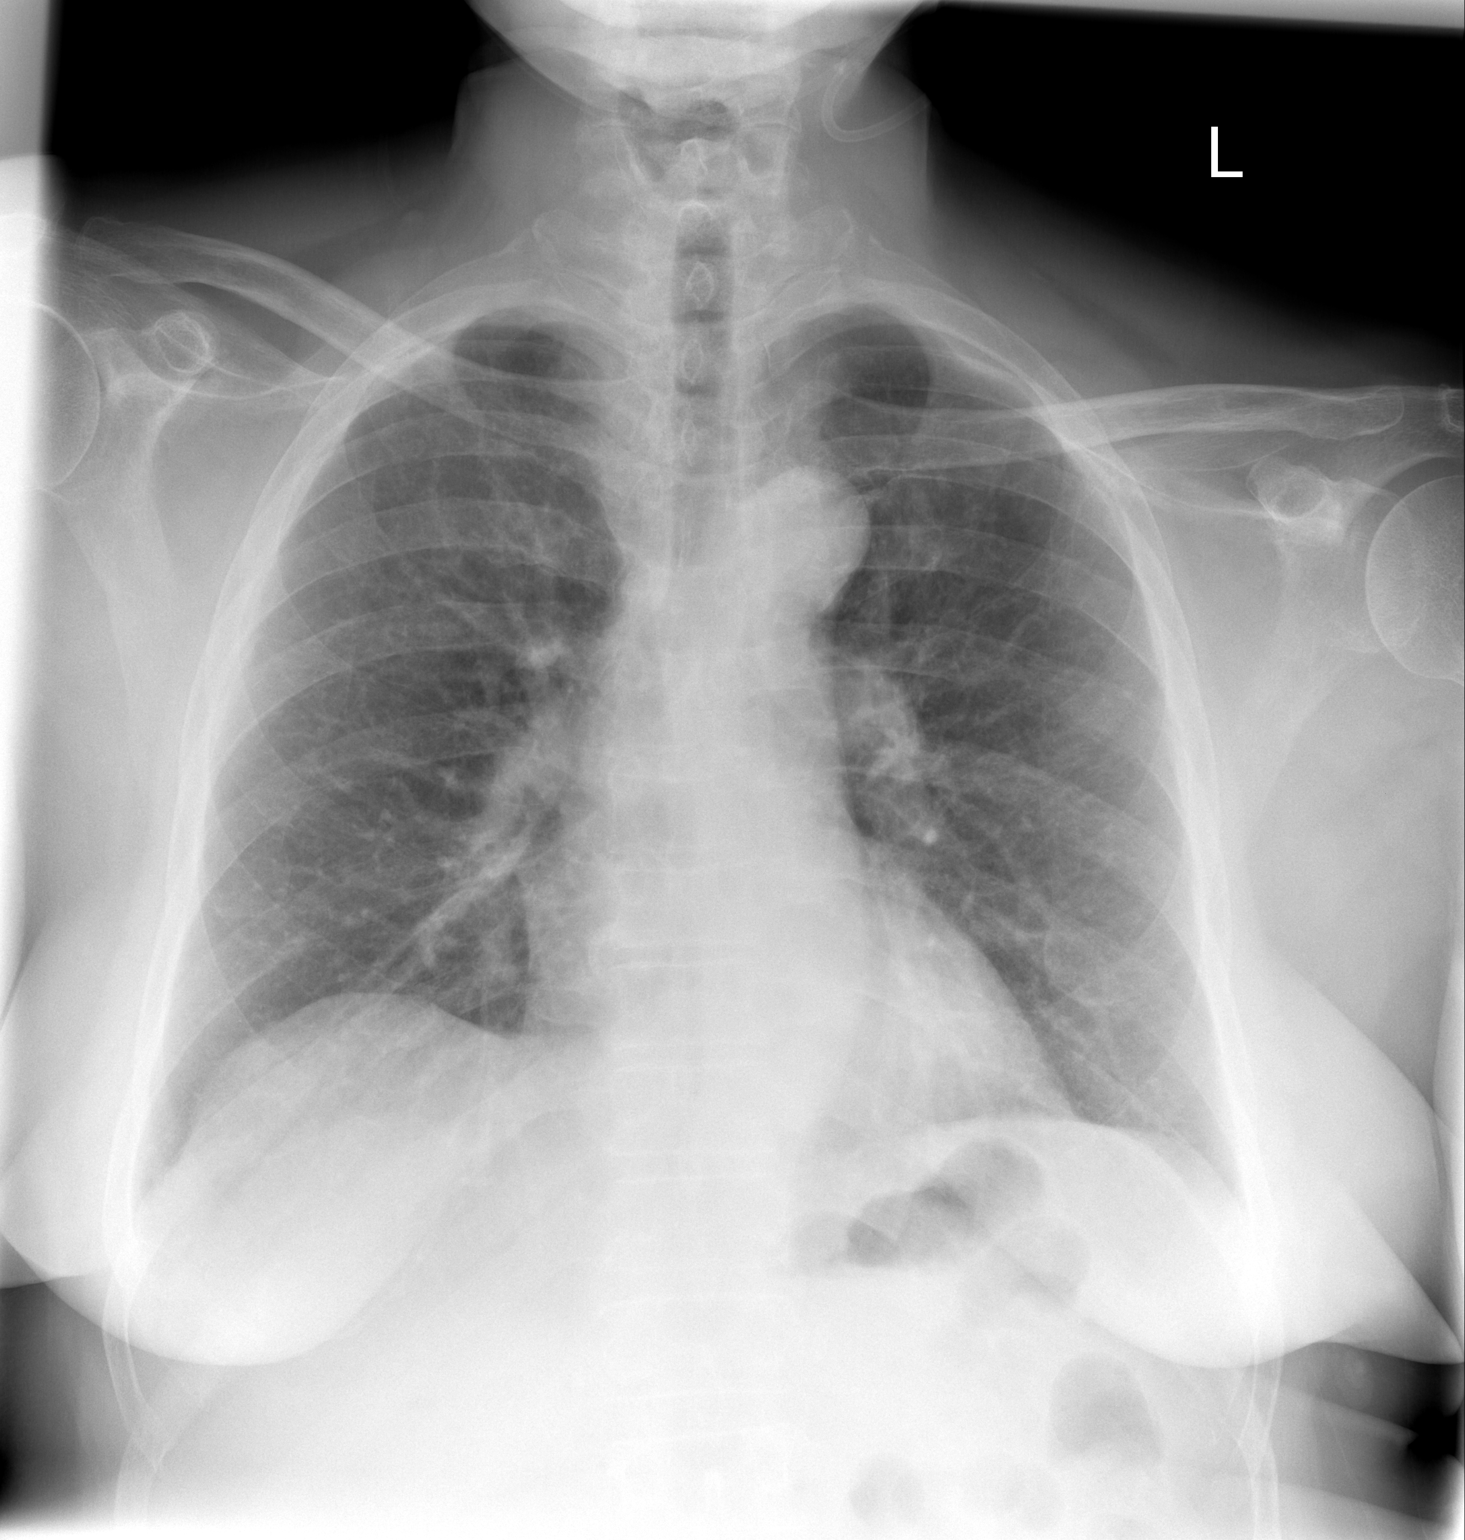

[w chest lat]
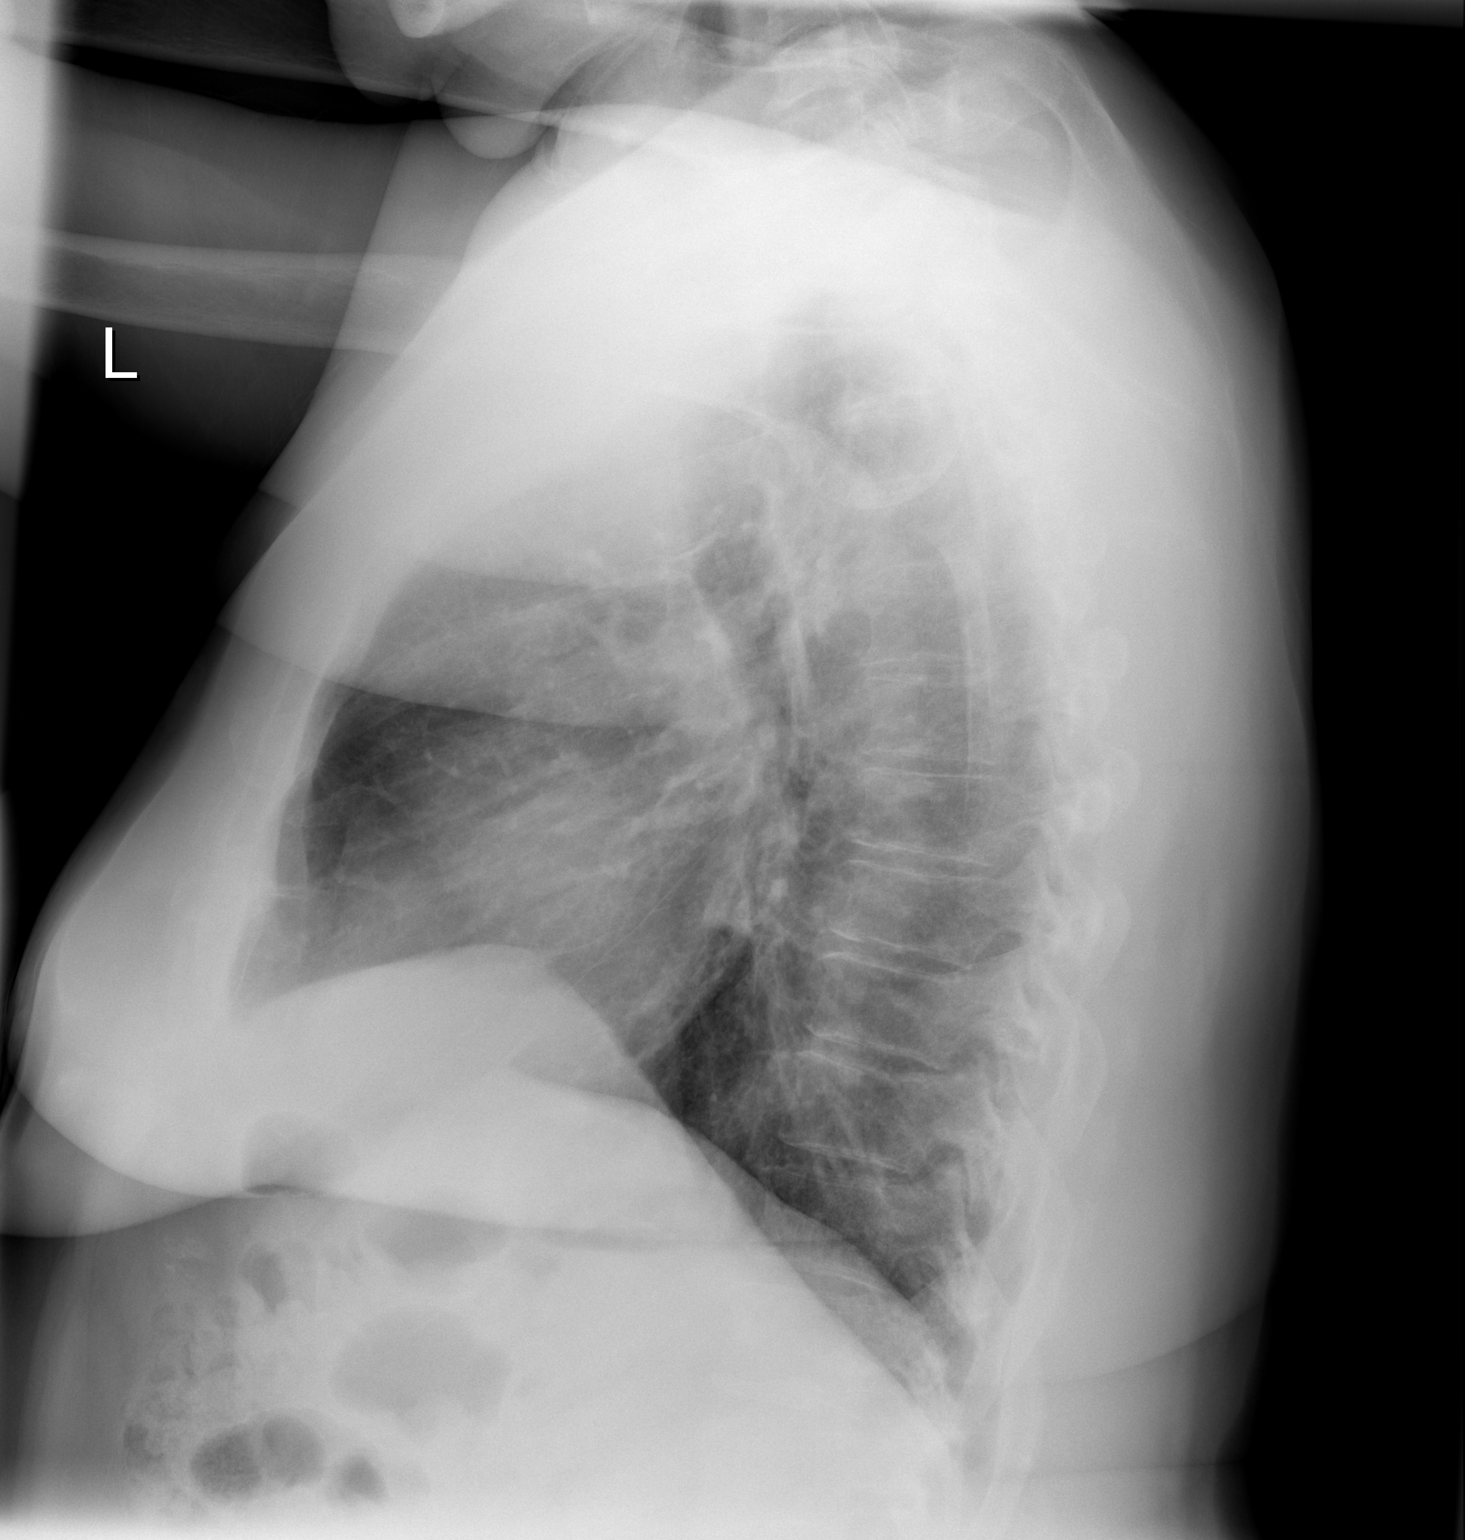

[2 of 2 positions shown; findings below may reference images not displayed]

FINDINGS: The heart size and mediastinal contours are within normal limits.
Both lungs are clear. The visualized skeletal structures are
unremarkable.
IMPRESSION: No active cardiopulmonary disease.

## 2016-03-23 DIAGNOSIS — C61 Malignant neoplasm of prostate: Secondary | ICD-10-CM | POA: Diagnosis not present

## 2016-03-23 DIAGNOSIS — Z5111 Encounter for antineoplastic chemotherapy: Secondary | ICD-10-CM | POA: Diagnosis not present

## 2016-04-06 ENCOUNTER — Encounter: Payer: Self-pay | Admitting: Internal Medicine

## 2016-05-26 ENCOUNTER — Ambulatory Visit: Payer: Commercial Managed Care - HMO

## 2016-05-26 ENCOUNTER — Encounter: Payer: Self-pay | Admitting: *Deleted

## 2016-05-26 ENCOUNTER — Other Ambulatory Visit: Payer: Commercial Managed Care - HMO

## 2016-05-28 ENCOUNTER — Encounter: Payer: Commercial Managed Care - HMO | Admitting: Internal Medicine

## 2016-05-31 ENCOUNTER — Other Ambulatory Visit (HOSPITAL_BASED_OUTPATIENT_CLINIC_OR_DEPARTMENT_OTHER): Payer: Medicare HMO

## 2016-05-31 DIAGNOSIS — C61 Malignant neoplasm of prostate: Secondary | ICD-10-CM

## 2016-05-31 LAB — CBC WITH DIFFERENTIAL/PLATELET
BASO%: 0.6 % (ref 0.0–2.0)
BASOS ABS: 0 10*3/uL (ref 0.0–0.1)
EOS ABS: 0.1 10*3/uL (ref 0.0–0.5)
EOS%: 1.1 % (ref 0.0–7.0)
HCT: 42.4 % (ref 38.4–49.9)
HEMOGLOBIN: 14 g/dL (ref 13.0–17.1)
LYMPH%: 37.6 % (ref 14.0–49.0)
MCH: 29.8 pg (ref 27.2–33.4)
MCHC: 33.1 g/dL (ref 32.0–36.0)
MCV: 90 fL (ref 79.3–98.0)
MONO#: 0.7 10*3/uL (ref 0.1–0.9)
MONO%: 11.1 % (ref 0.0–14.0)
NEUT%: 49.6 % (ref 39.0–75.0)
NEUTROS ABS: 3 10*3/uL (ref 1.5–6.5)
PLATELETS: 254 10*3/uL (ref 140–400)
RBC: 4.71 10*6/uL (ref 4.20–5.82)
RDW: 13.8 % (ref 11.0–14.6)
WBC: 6 10*3/uL (ref 4.0–10.3)
lymph#: 2.3 10*3/uL (ref 0.9–3.3)

## 2016-05-31 LAB — COMPREHENSIVE METABOLIC PANEL
ALBUMIN: 3.9 g/dL (ref 3.5–5.0)
ALK PHOS: 88 U/L (ref 40–150)
ALT: 18 U/L (ref 0–55)
ANION GAP: 9 meq/L (ref 3–11)
AST: 19 U/L (ref 5–34)
BILIRUBIN TOTAL: 0.48 mg/dL (ref 0.20–1.20)
BUN: 18.6 mg/dL (ref 7.0–26.0)
CO2: 23 mEq/L (ref 22–29)
Calcium: 9.8 mg/dL (ref 8.4–10.4)
Chloride: 106 mEq/L (ref 98–109)
Creatinine: 0.9 mg/dL (ref 0.7–1.3)
Glucose: 115 mg/dl (ref 70–140)
POTASSIUM: 4.6 meq/L (ref 3.5–5.1)
Sodium: 138 mEq/L (ref 136–145)
TOTAL PROTEIN: 7.6 g/dL (ref 6.4–8.3)

## 2016-06-01 LAB — PSA: PROSTATE SPECIFIC AG, SERUM: 22.5 ng/mL — AB (ref 0.0–4.0)

## 2016-06-02 ENCOUNTER — Telehealth: Payer: Self-pay | Admitting: Oncology

## 2016-06-02 ENCOUNTER — Ambulatory Visit (HOSPITAL_BASED_OUTPATIENT_CLINIC_OR_DEPARTMENT_OTHER): Payer: Medicare HMO | Admitting: Oncology

## 2016-06-02 VITALS — BP 121/77 | HR 75 | Temp 97.8°F | Resp 18 | Ht 69.0 in | Wt 260.9 lb

## 2016-06-02 DIAGNOSIS — E291 Testicular hypofunction: Secondary | ICD-10-CM | POA: Diagnosis not present

## 2016-06-02 DIAGNOSIS — C61 Malignant neoplasm of prostate: Secondary | ICD-10-CM | POA: Diagnosis not present

## 2016-06-02 MED ORDER — ENZALUTAMIDE 40 MG PO CAPS: 160.0000 mg | ORAL_CAPSULE | Freq: Every day | ORAL | 0 refills | Status: DC

## 2016-06-02 NOTE — Progress Notes (Signed)
Hematology and Oncology Follow Up Visit  Samuel [name] [id number] 07-31-1945 71 y.o. 06/02/2016 3:48 PM Samuel [name], DOCarter, [name], [place]   Principle Diagnosis: 71 year old with prostate cancer. He had a Gleason score 4+4 = 8 and a PSA of 8.97. 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   Prior Therapy: He was treated with external beam radiation and androgen deprivation. He developed recurrent disease in 2010 with a Gleason score 4+4 = 8 and PSA of 8.97.  He has been on combined androgen deprivation with [place] agonist on bicalutamide. PSA in June 2016 was 6.11, in December 2016 was 10, in June 2017 was 9 and in September 2017 was up to 12. Staging workup including CT scan of the abdomen and pelvis as well as bone scan were personally reviewed and showed no evidence of metastatic disease.  Current therapy:   Androgen Deprivation utilizing Trelstar given at [facility] urology.  Under consideration to start Xtandi in the near future.  Interim History: Samuel [name] presents today for a follow-up visit. His last visit, he continues to be asymptomatic from his prostate cancer. He denied any pathological fractures, bone pain or arthralgias. He continues to live independently and attends to activities of daily living. He does not report any abdominal pain or difficulty breathing. Cyst of fatigue, tiredness or decline in energy.  He does not report any headaches, blurry vision, syncope or seizures. He does not report any fevers, chills, sweats or weight loss. He does not report any chest pain, palpitation, orthopnea or leg edema. He does not report any cough, wheezing or hemoptysis. He does not report any nausea, vomiting or abdominal pain. He does not report any frequency urgency or hesitancy. Does not report a skeletal complaints. Remaining review of systems unremarkable.    Medications: I have reviewed the patient's current medications.  Current Outpatient Prescriptions   Medication Sig Dispense Refill  . aspirin 325 MG tablet Take 325 mg by mouth daily.    Marland Kitchen atorvastatin (LIPITOR) 10 MG tablet Take 1 tablet (10 mg total) by mouth every morning. 90 tablet 2  . tamsulosin (FLOMAX) 0.4 MG CAPS capsule Take 0.4 mg by mouth daily.    Marland Kitchen triptorelin (TRELSTAR) 11.25 MG injection Inject 11.25 mg into the muscle every 3 (three) months.    . enzalutamide (XTANDI) 40 MG capsule Take 4 capsules (160 mg total) by mouth daily. 120 capsule 0   No current facility-administered medications for this visit.      Allergies:  Allergies  Allergen Reactions  . Other Other (See Comments)    Patient stated,"if I touch felt (material) I get "knots" on my body."  . Peach [Prunus Persica] Other (See Comments)    Patient stated,"if I touch or eat peach fuzz, I get "knots" all over my face and mouth."     Past Medical History, Surgical history, Social history, and Family History were reviewed and updated.   Physical Exam: Blood pressure 121/77, pulse 75, temperature 97.8 F (36.6 C), temperature source Oral, resp. rate 18, height 5\' 9"  (1.753 m), weight 260 lb 14.4 oz (118.3 kg), SpO2 95 %. ECOG: 0 General appearance: alert and cooperative appeared without distress. Head: Normocephalic, without obvious abnormality no oral ulcers or lesions. Neck: no adenopathy Lymph nodes: Cervical, supraclavicular, and axillary nodes normal. Heart:regular rate and rhythm, S1, S2 normal, no murmur, click, rub or gallop Lung:chest clear, no wheezing, rales, normal symmetric air entry Abdomin: soft, non-tender, without masses or organomegaly no shifting dullness or ascites. EXT:no  erythema, induration, or nodules   Lab Results: Lab Results  Component Value Date   WBC 6.0 05/31/2016   HGB 14.0 05/31/2016   HCT 42.4 05/31/2016   MCV 90.0 05/31/2016   PLT 254 05/31/2016     Chemistry      Component Value Date/Time   NA 138 05/31/2016 1245   K 4.6 05/31/2016 1245   CL 105 01/28/2016  1334   CO2 23 05/31/2016 1245   BUN 18.6 05/31/2016 1245   CREATININE 0.9 05/31/2016 1245      Component Value Date/Time   CALCIUM 9.8 05/31/2016 1245   ALKPHOS 88 05/31/2016 1245   AST 19 05/31/2016 1245   ALT 18 05/31/2016 1245   BILITOT 0.48 05/31/2016 1245      Results for Samuel, [name] (MRN [id number]) as of 06/02/2016 15:25  Ref. Range 02/27/2016 14:07 05/31/2016 12:45  PSA Latest Ref Range: 0.0 - 4.0 ng/mL 16.0 (H) 22.5 (H)    Impression and Plan:   70 year old gentleman with the following issues:  1. Prostate cancer dating back to 2003. He was treated with external beam radiation and androgen deprivation. He developed recurrent disease in 2010 with a Gleason score 4+4 = 8 and PSA of 8.97. He was treated with combined androgen deprivation with excellent response initially.  He developed biochemical relapse disease with PSA up to 22.5 on 05/31/2016. Options of therapy were reviewed today which include observation and surveillance versus starting second line hormonal therapy. Given his high-risk disease, he is at risk of developing symptomatic prostate cancer without treatment. Risks and benefits of [name] was discussed today and he agrees to proceed. Complications associated with this medication include nausea, fatigue, edema, hematuria and rarely seizures.  2. Androgen depravation: I have recommended to continue that indefinitely.  3. Follow-up: Will be in 5 weeks to follow his progress on this medication and recheck his PSA.  Samuel Button, MD 4/18/20183:48 PM

## 2016-06-02 NOTE — Telephone Encounter (Signed)
Lab appointment scheduled for 05/22 and follow up for 05/24, per 06/02/16 los. Patient was given a copy of the AVS report and appointment schedule per 06/02/16 los.

## 2016-06-07 ENCOUNTER — Telehealth: Payer: Self-pay | Admitting: Pharmacist

## 2016-06-07 DIAGNOSIS — C61 Malignant neoplasm of prostate: Secondary | ICD-10-CM

## 2016-06-07 MED ORDER — ENZALUTAMIDE 40 MG PO CAPS: 160.0000 mg | ORAL_CAPSULE | Freq: Every day | ORAL | 0 refills | Status: DC

## 2016-06-07 NOTE — Telephone Encounter (Signed)
Oral Chemotherapy Pharmacist Encounter  Received new prescription for [facility] for metastatic prostate cancer Labs reviewed, OK for treatment Current medication list in Epic assessed, some DDIs with Xtandi identified  Xtandi and Flomax and Lipitor: Category D interaction. [name] is a CYP3A4 inducer and may lead to decreased exposure to both the Flomax and the Lipitor. Possibility of minimal clinical implications. This will be monitored.  Prescription and supporting information will be faxed to [facility] in [place], [place] (ph: [phone])  Oral Oncology Clinic will continue to follow  Johny Drilling, PharmD, BCPS, BCOP 06/07/2016  10:20 AM Oral Oncology Clinic [phone]

## 2016-06-10 NOTE — Telephone Encounter (Signed)
Oral Chemotherapy Pharmacist Encounter  Received notification from [facility] that patient's [facility] prescription would require prior authorization  PA submitted on CoverMyMeds Key [id number] Status is pending  Oral oncology Clinic will continue to follow  Johny Drilling, PharmD, BCPS, BCOP 06/10/2016  1:01 PM Oral Oncology Clinic [phone]

## 2016-06-21 ENCOUNTER — Telehealth: Payer: Self-pay | Admitting: *Deleted

## 2016-06-21 NOTE — Telephone Encounter (Signed)
FYI "I received a letter from [employer] that the [name] medication has been authorized but have not heard anything from the office to receive this medicine.  I use Product/process development scientist on [employer].  Return number [phone]."  Message with patient request left for oral chemotherapy pharmacist.  Will route to provider.  Informed Samuel [name] this is a specialty order medication.  These medicines generally are shipped via [employer].  Expect a call to arrange home delivery.

## 2016-06-21 NOTE — Telephone Encounter (Signed)
Oral Chemotherapy Pharmacist Encounter  I called [facility] at [phone] to follow-up on patient's [facility] prescription as patient has told the office he has not heard from anyone about his prescription.  They have been leaving patient messages since 06/11/16 with copayment amount ($[id number]) and offer to help identify copayment assistance.  I called patient to inquire if he had received these messages. He stated he may have.  I provided patient with pharmacy number to call and set-up delivery of his medication. Patient stated he also has copayment grant from [employer]. I instructed patient this information with filling pharmacy.  Patient understands and is in agreement with above plan. He knows to call the office with additional questions or concerns.  Johny Drilling, PharmD, BCPS, BCOP 06/21/2016  9:38 AM Oral Oncology Clinic [phone]

## 2016-06-25 NOTE — Telephone Encounter (Signed)
Oral Chemotherapy Pharmacist Encounter  Received call from [employer] [facility] [employer] [facility] that there are no copayment grants available for patient's diagnosis any longer and the office would need to look into manufacturer assistance for the patient.  I called and discussed with patient. Permission obtained to assist with manufacturer assistance application. Patient will come to [facility] this afternoon or Monday morning to sign application.  [place] application processing will be updated in a separate encounter.  Oral Oncology Clinic will continue to follow.  Johny Drilling, PharmD, BCPS, BCOP 06/25/2016  4:35 PM Oral Oncology Clinic [phone]

## 2016-06-28 ENCOUNTER — Telehealth: Payer: Self-pay | Admitting: Pharmacist

## 2016-06-28 NOTE — Telephone Encounter (Signed)
Oral Chemotherapy Pharmacist Encounter  I met patient in lobby to complete application for [facility] [employer] to try to obtain medication at $0 out of pocket cost to the patient from the manufacturer.  Completed application faxed to [employer] at [phone]  This encounter will continue to be updated until final determination.  Oral Oncology Clinic will continue to follow.   Johny Drilling, PharmD, BCPS, BCOP 06/28/2016  4:31 PM Oral Oncology Clinic [phone]

## 2016-06-30 NOTE — Telephone Encounter (Signed)
Oral Chemotherapy Pharmacist Encounter  Received notification from [employer] that patient has been successfully enrolled into their assistance program to receive Xtandi at $0 out of pocket cost until 02/14/17  Patient has been notified. Patient instructed to call the pharmacy at [phone] to set-up profile, but needs to wait until 5/17 to call to ensure [facility] has forwarded the pharmacy the prescription. Patient expressed understanding and appreciation.  He knows to call the office with questions or concerns.  Oral Oncology Clinic will continue to follow.  Johny Drilling, PharmD, BCPS, BCOP 06/30/2016  11:29 AM Oral Oncology Clinic [phone]

## 2016-07-06 ENCOUNTER — Other Ambulatory Visit (HOSPITAL_BASED_OUTPATIENT_CLINIC_OR_DEPARTMENT_OTHER): Payer: Medicare HMO

## 2016-07-06 DIAGNOSIS — C61 Malignant neoplasm of prostate: Secondary | ICD-10-CM

## 2016-07-06 LAB — COMPREHENSIVE METABOLIC PANEL
ALBUMIN: 3.8 g/dL (ref 3.5–5.0)
ALK PHOS: 99 U/L (ref 40–150)
ALT: 15 U/L (ref 0–55)
ANION GAP: 10 meq/L (ref 3–11)
AST: 19 U/L (ref 5–34)
BILIRUBIN TOTAL: 0.42 mg/dL (ref 0.20–1.20)
BUN: 20.7 mg/dL (ref 7.0–26.0)
CALCIUM: 9.1 mg/dL (ref 8.4–10.4)
CO2: 22 meq/L (ref 22–29)
CREATININE: 1.1 mg/dL (ref 0.7–1.3)
Chloride: 109 mEq/L (ref 98–109)
EGFR: 80 mL/min/{1.73_m2} — AB (ref >90)
Glucose: 106 mg/dl (ref 70–140)
Potassium: 4.2 mEq/L (ref 3.5–5.1)
Sodium: 141 mEq/L (ref 136–145)
TOTAL PROTEIN: 7 g/dL (ref 6.4–8.3)

## 2016-07-06 LAB — CBC WITH DIFFERENTIAL/PLATELET
BASO%: 0.8 % (ref 0.0–2.0)
Basophils Absolute: 0.1 10*3/uL (ref 0.0–0.1)
EOS ABS: 0.1 10*3/uL (ref 0.0–0.5)
EOS%: 0.8 % (ref 0.0–7.0)
HEMATOCRIT: 39.9 % (ref 38.4–49.9)
HGB: 13.3 g/dL (ref 13.0–17.1)
LYMPH#: 2.5 10*3/uL (ref 0.9–3.3)
LYMPH%: 41.1 % (ref 14.0–49.0)
MCH: 29.8 pg (ref 27.2–33.4)
MCHC: 33.3 g/dL (ref 32.0–36.0)
MCV: 89.5 fL (ref 79.3–98.0)
MONO#: 0.7 10*3/uL (ref 0.1–0.9)
MONO%: 11.8 % (ref 0.0–14.0)
NEUT%: 45.5 % (ref 39.0–75.0)
NEUTROS ABS: 2.8 10*3/uL (ref 1.5–6.5)
PLATELETS: 210 10*3/uL (ref 140–400)
RBC: 4.45 10*6/uL (ref 4.20–5.82)
RDW: 13.7 % (ref 11.0–14.6)
WBC: 6.1 10*3/uL (ref 4.0–10.3)

## 2016-07-07 LAB — PSA: Prostate Specific Ag, Serum: 17.2 ng/mL — ABNORMAL HIGH (ref 0.0–4.0)

## 2016-07-08 ENCOUNTER — Telehealth: Payer: Self-pay | Admitting: Oncology

## 2016-07-08 ENCOUNTER — Ambulatory Visit (HOSPITAL_BASED_OUTPATIENT_CLINIC_OR_DEPARTMENT_OTHER): Payer: Medicare HMO | Admitting: Oncology

## 2016-07-08 VITALS — BP 138/73 | HR 68 | Temp 97.9°F | Resp 18 | Ht 69.0 in | Wt 258.1 lb

## 2016-07-08 DIAGNOSIS — E291 Testicular hypofunction: Secondary | ICD-10-CM

## 2016-07-08 DIAGNOSIS — C61 Malignant neoplasm of prostate: Secondary | ICD-10-CM

## 2016-07-08 NOTE — Progress Notes (Signed)
Hematology and Oncology Follow Up Visit  Samuel [name] [id number] 10-Jul-1945 71 y.o. 07/08/2016 10:45 AM [name], [facility], [place], [place]   Principle Diagnosis: 71 year old with prostate cancer. He had a Gleason score 4+4 = 8 and a PSA of 8.97. 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   Prior Therapy: He was treated with external beam radiation and androgen deprivation. He developed recurrent disease in 2010 with a Gleason score 4+4 = 8 and PSA of 8.97.  He has been on combined androgen deprivation with [place] agonist on bicalutamide. PSA in June 2016 was 6.11, in December 2016 was 10, in June 2017 was 9 and in September 2017 was up to 12. Staging workup including CT scan of the abdomen and pelvis as well as bone scan were personally reviewed and showed no evidence of metastatic disease.  Current therapy:   Androgen Deprivation utilizing Trelstar given at [facility] urology.  Xtandi 160 mg daily started in May 2018.  Interim History: Mr. Fariss presents today for a follow-up visit. His last visit, he started [place] and have tolerated it well. He denied any nausea, fatigue or edema. He denied any pathological fractures, bone pain or arthralgias. He continues to live independently and attends to activities of daily living. He does not report any abdominal pain or difficulty breathing. His quality of life and performance status not dramatically changed.  He does not report any headaches, blurry vision, syncope or seizures. He does not report any fevers, chills, sweats or weight loss. He does not report any chest pain, palpitation, orthopnea or leg edema. He does not report any cough, wheezing or hemoptysis. He does not report any nausea, vomiting or abdominal pain. He does not report any frequency urgency or hesitancy. Does not report a skeletal complaints. Remaining review of systems unremarkable.    Medications: I have reviewed the patient's current medications.  Current  Outpatient Prescriptions  Medication Sig Dispense Refill  . aspirin 325 MG tablet Take 325 mg by mouth daily.    Marland Kitchen atorvastatin (LIPITOR) 10 MG tablet Take 1 tablet (10 mg total) by mouth every morning. 90 tablet 2  . enzalutamide (XTANDI) 40 MG capsule Take 4 capsules (160 mg total) by mouth daily. 120 capsule 0  . tamsulosin (FLOMAX) 0.4 MG CAPS capsule Take 0.4 mg by mouth daily.    Marland Kitchen triptorelin (TRELSTAR) 11.25 MG injection Inject 11.25 mg into the muscle every 3 (three) months.     No current facility-administered medications for this visit.      Allergies:  Allergies  Allergen Reactions  . Other Other (See Comments)    Patient stated,"if I touch felt (material) I get "knots" on my body."  . Peach [Prunus Persica] Other (See Comments)    Patient stated,"if I touch or eat peach fuzz, I get "knots" all over my face and mouth."     Past Medical History, Surgical history, Social history, and Family History were reviewed and updated.   Physical Exam: Blood pressure 138/73, pulse 68, temperature 97.9 F (36.6 C), temperature source Oral, resp. rate 18, height 5\' 9"  (1.753 m), weight 258 lb 1.6 oz (117.1 kg), SpO2 99 %. ECOG: 0 General appearance: Well-appearing gentleman without distress. Head: Normocephalic, without obvious abnormality no oral thrush or ulcers. Neck: no adenopathy Lymph nodes: Cervical, supraclavicular, and axillary nodes normal. Heart:regular rate and rhythm, S1, S2 normal, no murmur, click, rub or gallop Lung:chest clear, no wheezing, rales, normal symmetric air entry Abdomin: soft, non-tender, without masses or organomegaly no  rebound or guarding. EXT:no erythema, induration, or nodules   Lab Results: Lab Results  Component Value Date   WBC 6.1 07/06/2016   HGB 13.3 07/06/2016   HCT 39.9 07/06/2016   MCV 89.5 07/06/2016   PLT 210 07/06/2016     Chemistry      Component Value Date/Time   NA 141 07/06/2016 1337   K 4.2 07/06/2016 1337   CL 105  01/28/2016 1334   CO2 22 07/06/2016 1337   BUN 20.7 07/06/2016 1337   CREATININE 1.1 07/06/2016 1337      Component Value Date/Time   CALCIUM 9.1 07/06/2016 1337   ALKPHOS 99 07/06/2016 1337   AST 19 07/06/2016 1337   ALT 15 07/06/2016 1337   BILITOT 0.42 07/06/2016 1337      Results for CHONG, WOJDYLA (MRN [id number]) as of 07/08/2016 10:27  Ref. Range 02/27/2016 14:07 05/31/2016 12:45 07/06/2016 13:37  PSA Latest Ref Range: 0.0 - 4.0 ng/mL 16.0 (H) 22.5 (H) 17.2 (H)     Impression and Plan:   71 year old gentleman with the following issues:  1. Prostate cancer dating back to 2003. He was treated with external beam radiation and androgen deprivation. He developed recurrent disease in 2010 with a Gleason score 4+4 = 8 and PSA of 8.97. He was treated with combined androgen deprivation with excellent response initially.  He developed biochemical relapse disease with PSA up to 22.5 on 05/31/2016.   He is currently on Xtandi and have tolerated it well so far. His PSA is declining which indicated positive response to therapy. Blasts continue with same dose and schedule and continue to monitor periodically.  2. Androgen depravation: I have recommended to continue that indefinitely.  3. Follow-up: Will be in 2 months to follow his progress.  [name], MD 5/24/201810:45 AM

## 2016-07-08 NOTE — Telephone Encounter (Signed)
Appointments scheduled per 07/08/16 los. Patient was given a copy of the AVS report and appointment schedule per 07/08/16 los. °

## 2016-07-13 ENCOUNTER — Encounter: Payer: Self-pay | Admitting: Internal Medicine

## 2016-07-27 ENCOUNTER — Other Ambulatory Visit: Payer: Self-pay | Admitting: *Deleted

## 2016-07-27 DIAGNOSIS — C61 Malignant neoplasm of prostate: Secondary | ICD-10-CM

## 2016-07-27 MED ORDER — ENZALUTAMIDE 40 MG PO CAPS: 160.0000 mg | ORAL_CAPSULE | Freq: Every day | ORAL | 0 refills | Status: DC

## 2016-08-23 ENCOUNTER — Other Ambulatory Visit: Payer: Self-pay | Admitting: *Deleted

## 2016-08-23 DIAGNOSIS — C61 Malignant neoplasm of prostate: Secondary | ICD-10-CM

## 2016-08-23 MED ORDER — ENZALUTAMIDE 40 MG PO CAPS: 160.0000 mg | ORAL_CAPSULE | Freq: Every day | ORAL | 0 refills | Status: DC

## 2016-08-25 ENCOUNTER — Other Ambulatory Visit: Payer: Commercial Managed Care - HMO

## 2016-08-25 ENCOUNTER — Ambulatory Visit: Payer: Commercial Managed Care - HMO

## 2016-08-27 ENCOUNTER — Encounter: Payer: Commercial Managed Care - HMO | Admitting: Internal Medicine

## 2016-09-14 ENCOUNTER — Other Ambulatory Visit (HOSPITAL_BASED_OUTPATIENT_CLINIC_OR_DEPARTMENT_OTHER): Payer: Medicare HMO

## 2016-09-14 DIAGNOSIS — C61 Malignant neoplasm of prostate: Secondary | ICD-10-CM | POA: Diagnosis not present

## 2016-09-14 LAB — CBC WITH DIFFERENTIAL/PLATELET
BASO%: 0.3 % (ref 0.0–2.0)
BASOS ABS: 0 10*3/uL (ref 0.0–0.1)
EOS ABS: 0 10*3/uL (ref 0.0–0.5)
EOS%: 0.6 % (ref 0.0–7.0)
HCT: 39.3 % (ref 38.4–49.9)
HEMOGLOBIN: 13.3 g/dL (ref 13.0–17.1)
LYMPH%: 43.1 % (ref 14.0–49.0)
MCH: 30.6 pg (ref 27.2–33.4)
MCHC: 33.8 g/dL (ref 32.0–36.0)
MCV: 90.3 fL (ref 79.3–98.0)
MONO#: 0.5 10*3/uL (ref 0.1–0.9)
MONO%: 8.4 % (ref 0.0–14.0)
NEUT#: 3 10*3/uL (ref 1.5–6.5)
NEUT%: 47.6 % (ref 39.0–75.0)
Platelets: 221 10*3/uL (ref 140–400)
RBC: 4.35 10*6/uL (ref 4.20–5.82)
RDW: 14.1 % (ref 11.0–14.6)
WBC: 6.3 10*3/uL (ref 4.0–10.3)
lymph#: 2.7 10*3/uL (ref 0.9–3.3)

## 2016-09-14 LAB — COMPREHENSIVE METABOLIC PANEL
ALBUMIN: 3.7 g/dL (ref 3.5–5.0)
ALK PHOS: 96 U/L (ref 40–150)
ALT: 15 U/L (ref 0–55)
ANION GAP: 9 meq/L (ref 3–11)
AST: 18 U/L (ref 5–34)
BILIRUBIN TOTAL: 0.41 mg/dL (ref 0.20–1.20)
BUN: 16.3 mg/dL (ref 7.0–26.0)
CALCIUM: 9.4 mg/dL (ref 8.4–10.4)
CO2: 22 mEq/L (ref 22–29)
Chloride: 108 mEq/L (ref 98–109)
Creatinine: 0.9 mg/dL (ref 0.7–1.3)
GLUCOSE: 104 mg/dL (ref 70–140)
Potassium: 4 mEq/L (ref 3.5–5.1)
SODIUM: 138 meq/L (ref 136–145)
Total Protein: 7 g/dL (ref 6.4–8.3)

## 2016-09-15 LAB — PSA: Prostate Specific Ag, Serum: 7.3 ng/mL — ABNORMAL HIGH (ref 0.0–4.0)

## 2016-09-16 ENCOUNTER — Telehealth: Payer: Self-pay

## 2016-09-16 ENCOUNTER — Ambulatory Visit (HOSPITAL_BASED_OUTPATIENT_CLINIC_OR_DEPARTMENT_OTHER): Payer: Medicare HMO | Admitting: Oncology

## 2016-09-16 VITALS — BP 157/64 | HR 80 | Temp 97.6°F | Resp 18 | Ht 69.0 in | Wt 256.9 lb

## 2016-09-16 DIAGNOSIS — C61 Malignant neoplasm of prostate: Secondary | ICD-10-CM

## 2016-09-16 DIAGNOSIS — E291 Testicular hypofunction: Secondary | ICD-10-CM | POA: Diagnosis not present

## 2016-09-16 NOTE — Telephone Encounter (Signed)
Gave patient avs and follow up appointment dates in October, and November

## 2016-09-16 NOTE — Progress Notes (Signed)
Hematology and Oncology Follow Up Visit  Samuel [name] [id number] October 18, 1945 71 y.o. 09/16/2016 1:26 PM [name], [facility], [place], [place]   Principle Diagnosis: 71 year old with prostate cancer. He had a Gleason score 4+4 = 8 and a PSA of 8.97. 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   Prior Therapy: He was treated with external beam radiation and androgen deprivation. He developed recurrent disease in 2010 with a Gleason score 4+4 = 8 and PSA of 8.97.  He has been on combined androgen deprivation with [place] agonist on bicalutamide. PSA in June 2016 was 6.11, in December 2016 was 10, in June 2017 was 9 and in September 2017 was up to 12. Staging workup including CT scan of the abdomen and pelvis as well as bone scan were personally reviewed and showed no evidence of metastatic disease.  Current therapy:   Androgen Deprivation utilizing Trelstar given at [facility] urology.  Xtandi 160 mg daily started in May 2018.  Interim History: Samuel [name] presents today for a follow-up visit. His last visit, he reports no recent complications. He continues to take [place] and continues to tolerated it well. He denied any nausea, fatigue or edema. He denied any pathological fractures, bone pain or arthralgias. He continues to enjoy a reasonable quality of life without any deterioration in his health. He attends to activities of daily living. He does not report any abdominal pain or difficulty breathing. His quality of life and performance status not changed. He has no difficulties obtaining this medication are taken at that this time.  He does not report any headaches, blurry vision, syncope or seizures. He does not report any fevers, chills, sweats or weight loss. He does not report any chest pain, palpitation, orthopnea or leg edema. He does not report any cough, wheezing or hemoptysis. He does not report any nausea, vomiting or abdominal pain. He does not report any frequency urgency or  hesitancy. Does not report a skeletal complaints. Remaining review of systems unremarkable.    Medications: I have reviewed the patient's current medications.  Current Outpatient Prescriptions  Medication Sig Dispense Refill  . aspirin 325 MG tablet Take 325 mg by mouth daily.    Marland Kitchen atorvastatin (LIPITOR) 10 MG tablet Take 1 tablet (10 mg total) by mouth every morning. 90 tablet 2  . enzalutamide (XTANDI) 40 MG capsule Take 4 capsules (160 mg total) by mouth daily. 120 capsule 0  . tamsulosin (FLOMAX) 0.4 MG CAPS capsule Take 0.4 mg by mouth daily.    Marland Kitchen triptorelin (TRELSTAR) 11.25 MG injection Inject 11.25 mg into the muscle every 3 (three) months.     No current facility-administered medications for this visit.      Allergies:  Allergies  Allergen Reactions  . Other Other (See Comments)    Patient stated,"if I touch felt (material) I get "knots" on my body."  . Peach [Prunus Persica] Other (See Comments)    Patient stated,"if I touch or eat peach fuzz, I get "knots" all over my face and mouth."     Past Medical History, Surgical history, Social history, and Family History were reviewed and updated.   Physical Exam: Blood pressure (!) 157/64, pulse 80, temperature 97.6 F (36.4 C), temperature source Oral, resp. rate 18, height 5\' 9"  (1.753 m), weight 256 lb 14.4 oz (116.5 kg), SpO2 100 %. ECOG: 0 General appearance: Alert, awake gentleman without distress. Head: Normocephalic, without obvious abnormality no oral ulcers or lesions. Neck: no adenopathy or masses. Lymph nodes: Cervical, supraclavicular,  and axillary nodes normal. Heart:regular rate and rhythm, S1, S2 normal, no murmur, click, rub or gallop Lung:chest clear, no wheezing, rales, normal symmetric air entry Abdomin: soft, non-tender, without masses or organomegaly no rebound or guarding. EXT:no erythema, induration, or nodules   Lab Results: Lab Results  Component Value Date   WBC 6.3 09/14/2016   HGB 13.3  09/14/2016   HCT 39.3 09/14/2016   MCV 90.3 09/14/2016   PLT 221 09/14/2016     Chemistry      Component Value Date/Time   NA 138 09/14/2016 1419   K 4.0 09/14/2016 1419   CL 105 01/28/2016 1334   CO2 22 09/14/2016 1419   BUN 16.3 09/14/2016 1419   CREATININE 0.9 09/14/2016 1419      Component Value Date/Time   CALCIUM 9.4 09/14/2016 1419   ALKPHOS 96 09/14/2016 1419   AST 18 09/14/2016 1419   ALT 15 09/14/2016 1419   BILITOT 0.41 09/14/2016 1419       Results for OBE, AHLERS (MRN [id number]) as of 09/16/2016 13:15  Ref. Range 05/31/2016 12:45 07/06/2016 13:37 09/14/2016 14:19  Prostate Specific Ag, Serum Latest Ref Range: 0.0 - 4.0 ng/mL 22.5 (H) 17.2 (H) 7.3 (H)     Impression and Plan:   71 year old gentleman with the following issues:  1. Prostate cancer dating back to 2003. He was treated with external beam radiation and androgen deprivation. He developed recurrent disease in 2010 with a Gleason score 4+4 = 8 and PSA of 8.97. He was treated with combined androgen deprivation with excellent response initially.  He developed biochemical relapse disease with PSA up to 22.5 on 05/31/2016.   He is currently on Xtandi and Continue to tolerated very well. His PSA continues to decline with excellent response to therapy. Risks and benefits of continuing this medication were reviewed today is agreeable to continue.  2. Androgen depravation: He continues to receive that at [facility] urology. This will continue indefinitely. I will recheck his testosterone level the next visit.  3. Follow-up: Will be in 3 months to follow his progress.  Zola Button, MD 8/2/20181:26 PM

## 2016-09-21 ENCOUNTER — Other Ambulatory Visit: Payer: Self-pay | Admitting: *Deleted

## 2016-09-21 DIAGNOSIS — C61 Malignant neoplasm of prostate: Secondary | ICD-10-CM

## 2016-09-21 MED ORDER — ENZALUTAMIDE 40 MG PO CAPS: 160.0000 mg | ORAL_CAPSULE | Freq: Every day | ORAL | 0 refills | Status: DC

## 2016-09-22 ENCOUNTER — Other Ambulatory Visit: Payer: Self-pay | Admitting: *Deleted

## 2016-09-22 DIAGNOSIS — C61 Malignant neoplasm of prostate: Secondary | ICD-10-CM

## 2016-09-22 MED ORDER — ENZALUTAMIDE 40 MG PO CAPS: 160.0000 mg | ORAL_CAPSULE | Freq: Every day | ORAL | 0 refills | Status: DC

## 2016-09-23 ENCOUNTER — Other Ambulatory Visit: Payer: Self-pay | Admitting: *Deleted

## 2016-09-23 DIAGNOSIS — C61 Malignant neoplasm of prostate: Secondary | ICD-10-CM

## 2016-09-23 MED ORDER — ENZALUTAMIDE 40 MG PO CAPS: 160.0000 mg | ORAL_CAPSULE | Freq: Every day | ORAL | 0 refills | Status: DC

## 2016-09-27 DIAGNOSIS — R3912 Poor urinary stream: Secondary | ICD-10-CM | POA: Diagnosis not present

## 2016-09-27 DIAGNOSIS — C61 Malignant neoplasm of prostate: Secondary | ICD-10-CM | POA: Diagnosis not present

## 2016-09-27 DIAGNOSIS — Z5111 Encounter for antineoplastic chemotherapy: Secondary | ICD-10-CM | POA: Diagnosis not present

## 2016-10-08 ENCOUNTER — Ambulatory Visit (INDEPENDENT_AMBULATORY_CARE_PROVIDER_SITE_OTHER): Payer: Medicare HMO

## 2016-10-08 ENCOUNTER — Other Ambulatory Visit: Payer: Medicare HMO

## 2016-10-08 VITALS — BP 150/80 | HR 68 | Temp 97.7°F | Ht 69.0 in | Wt 256.0 lb

## 2016-10-08 DIAGNOSIS — Z0000 Encounter for general adult medical examination without abnormal findings: Secondary | ICD-10-CM | POA: Diagnosis not present

## 2016-10-08 DIAGNOSIS — E782 Mixed hyperlipidemia: Secondary | ICD-10-CM | POA: Diagnosis not present

## 2016-10-08 DIAGNOSIS — E1141 Type 2 diabetes mellitus with diabetic mononeuropathy: Secondary | ICD-10-CM

## 2016-10-08 DIAGNOSIS — C61 Malignant neoplasm of prostate: Secondary | ICD-10-CM

## 2016-10-08 LAB — CBC WITH DIFFERENTIAL/PLATELET
BASOS PCT: 1 %
Basophils Absolute: 52 cells/uL (ref 0–200)
EOS ABS: 52 {cells}/uL (ref 15–500)
EOS PCT: 1 %
HEMATOCRIT: 41.5 % (ref 38.5–50.0)
HEMOGLOBIN: 14.2 g/dL (ref 13.2–17.1)
Lymphocytes Relative: 45 %
Lymphs Abs: 2340 cells/uL (ref 850–3900)
MCH: 30.7 pg (ref 27.0–33.0)
MCHC: 34.2 g/dL (ref 32.0–36.0)
MCV: 89.6 fL (ref 80.0–100.0)
MPV: 10.5 fL (ref 7.5–12.5)
Monocytes Absolute: 520 cells/uL (ref 200–950)
Monocytes Relative: 10 %
NEUTROS PCT: 43 %
Neutro Abs: 2236 cells/uL (ref 1500–7800)
Platelets: 307 10*3/uL (ref 140–400)
RBC: 4.63 MIL/uL (ref 4.20–5.80)
RDW: 14.1 % (ref 11.0–15.0)
WBC: 5.2 10*3/uL (ref 3.8–10.8)

## 2016-10-08 NOTE — Progress Notes (Signed)
Subjective:   Samuel [name] is a 71 y.o. male who presents for an Initial Medicare Annual Wellness Visit.    Objective:    Today's Vitals   10/08/16 1113  BP: (!) 150/80  Pulse: 68  Temp: 97.7 F (36.5 C)  TempSrc: Oral  SpO2: 98%  Weight: 256 lb (116.1 kg)  Height: 5\' 9"  (1.753 m)   Body mass index is 37.8 kg/m.  Current Medications (verified) Outpatient Encounter Prescriptions as of 10/08/2016  Medication Sig  . aspirin 325 MG tablet Take 325 mg by mouth daily.  Marland Kitchen atorvastatin (LIPITOR) 10 MG tablet Take 1 tablet (10 mg total) by mouth every morning.  . enzalutamide (XTANDI) 40 MG capsule Take 4 capsules (160 mg total) by mouth daily.  . tamsulosin (FLOMAX) 0.4 MG CAPS capsule Take 0.4 mg by mouth daily.  Marland Kitchen triptorelin (TRELSTAR) 11.25 MG injection Inject 11.25 mg into the muscle. Every 6 months  . [DISCONTINUED] triptorelin (TRELSTAR) 11.25 MG injection Inject 11.25 mg into the muscle every 3 (three) months.   No facility-administered encounter medications on file as of 10/08/2016.     Allergies (verified) Other and Peach [prunus persica]   History: Past Medical History:  Diagnosis Date  . Blind left eye   . Cyst in hand 10/2013   left hand, treated with antibiotic & pain medicine  . Diabetes mellitus without complication ([place])    ?? borderline  being checked  . Hepatitis A    25 YRS AGO  . Prostate cancer ([place])   . Sleep apnea    ????   O2 DESAT  10/26/13  IN ED     Past Surgical History:  Procedure Laterality Date  . EYE SURGERY     LEFT AYE AS CHILD   . JOINT REPLACEMENT     LT KNEE 3 YRS AGO   . 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 TOTAL KNEE ARTHROPLASTY Right 12/04/2013   Procedure: TOTAL KNEE ARTHROPLASTY;  Surgeon: Hessie Dibble, MD;  Location: [facility];  Service: Orthopedics;  Laterality: Right;   Family History  Problem Relation Age of Onset  . Stroke Mother   . Heart attack Mother   . Diabetes Mother   . Stroke Father    . Stomach cancer Brother   . Prostate cancer Brother   . Throat cancer Sister    Social History   Occupational History  . Not on file.   Social History Main Topics  . Smoking status: Former Smoker    Packs/day: 0.50    Years: 5.00    Types: Cigarettes    Quit date: 02/16/1983  . Smokeless tobacco: Never Used  . Alcohol use No  . Drug use: No  . Sexual activity: Not on file   Tobacco Counseling Counseling given: Not Answered   Activities of Daily Living In your present state of health, do you have any difficulty performing the following activities: 10/08/2016  Hearing? N  Vision? Y  Comment L eye blind  Difficulty concentrating or making decisions? N  Walking or climbing stairs? N  Dressing or bathing? N  Doing errands, shopping? Y  Preparing Food and eating ? N  Using the Toilet? N  In the past six months, have you accidently leaked urine? N  Comment urinary frequency  Do you have problems with loss of bowel control? N  Managing your Medications? N  Managing your Finances? N  Housekeeping or managing your Housekeeping? N  Some recent data  might be hidden    Immunizations and Health Maintenance  There is no immunization history on file for this patient. Health Maintenance Due  Topic Date Due  . Hepatitis C Screening  Feb 20, 1945  . OPHTHALMOLOGY EXAM  03/25/1954  . TETANUS/TDAP  03/26/1963  . PNA vac Low Risk Adult (1 of 2 - PCV13) 03/25/2009  . HEMOGLOBIN A1C  07/28/2016  . INFLUENZA VACCINE  09/15/2016  . FOOT EXAM  09/25/2016  . URINE MICROALBUMIN  09/25/2016    Patient Care Team: [name], DO as PCP - General (Internal Medicine) Festus Aloe, MD as Consulting Physician (Urology)  Indicate any recent Medical Services you may have received from other than [facility] providers in the past year (date may be approximate).    Assessment:   This is a routine wellness examination for Samuel [name].   Hearing/Vision screen No exam data present  Dietary  issues and exercise activities discussed: Current Exercise Habits: Home exercise routine, Time (Minutes): 25, Frequency (Times/Week): 6, Weekly Exercise (Minutes/Week): 150, Intensity: Mild  Goals    None     Depression Screen PHQ 2/9 Scores 10/08/2016 05/21/2015  PHQ - 2 Score 0 0    Fall Risk Fall Risk  10/08/2016 01/30/2016 09/26/2015 05/21/2015  Falls in the past year? Yes No No No  Number falls in past yr: 1 - - -  Injury with Fall? No - - -    Cognitive Function: MMSE - Mini Mental State Exam 10/08/2016 05/21/2015  Not completed: - (No Data)  Orientation to time 4 5  Orientation to Place 5 5  Registration 3 3  Attention/ Calculation 5 5  Recall 1 1  Language- name 2 objects 2 2  Language- repeat 1 1  Language- follow 3 step command 3 3  Language- read & follow direction 1 1  Write a sentence 1 1  Copy design 1 1  Total score 27 28        Screening Tests Health Maintenance  Topic Date Due  . Hepatitis C Screening  1945/09/17  . OPHTHALMOLOGY EXAM  03/25/1954  . TETANUS/TDAP  03/26/1963  . PNA vac Low Risk Adult (1 of 2 - PCV13) 03/25/2009  . HEMOGLOBIN A1C  07/28/2016  . INFLUENZA VACCINE  09/15/2016  . FOOT EXAM  09/25/2016  . URINE MICROALBUMIN  09/25/2016  . Fecal DNA (Cologuard)  06/03/2018        Plan:     Quick Notes   Health Maintenance: Eye and foot exam due- pt stated he was not a diabetic. TDAP, PNA 13 and flu declined. Microalbumin, Hep C screen due.     Abnormal Screen: MMSE 27/30. Passed clock drawing.     Patient Concerns: Urinary frequency     Nurse Concerns: none

## 2016-10-08 NOTE — Patient Instructions (Addendum)
[name] , Thank you for taking time to come for your Medicare Wellness Visit. I appreciate your ongoing commitment to your health goals. Please review the following plan we discussed and let me know if I can assist you in the future.   Screening recommendations/referrals: Colonoscopy- colorguard due 06/03/2018 Recommended yearly ophthalmology/optometry visit for glaucoma screening and checkup Recommended yearly dental visit for hygiene and checkup  Vaccinations: Influenza vaccine due, declined Pneumococcal vaccine due, declined Tdap vaccine due, declined Shingles vaccine due, declined  Advanced directives: Advance directive discussed with you today. You have a copy for you to complete at home and have notarized. Once this is complete please bring a copy in to our office so we can scan it into your chart.   Conditions/risks identified: None  Next appointment: Dr. Eulas Post 10/13/16 @ 2:45pm  Preventive Care 29 Years and Older, Male Preventive care refers to lifestyle choices and visits with your health care provider that can promote health and wellness. What does preventive care include?  A yearly physical exam. This is also called an annual well check.  Dental exams once or twice a year.  Routine eye exams. Ask your health care provider how often you should have your eyes checked.  Personal lifestyle choices, including:  Daily care of your teeth and gums.  Regular physical activity.  Eating a healthy diet.  Avoiding tobacco and drug use.  Limiting alcohol use.  Practicing safe sex.  Taking low doses of aspirin every day.  Taking vitamin and mineral supplements as recommended by your health care provider. What happens during an annual well check? The services and screenings done by your health care provider during your annual well check will depend on your age, overall health, lifestyle risk factors, and family history of disease. Counseling  Your health care provider  may ask you questions about your:  Alcohol use.  Tobacco use.  Drug use.  Emotional well-being.  Home and relationship well-being.  Sexual activity.  Eating habits.  History of falls.  Memory and ability to understand (cognition).  Work and work Statistician. Screening  You may have the following tests or measurements:  Height, weight, and BMI.  Blood pressure.  Lipid and cholesterol levels. These may be checked every 5 years, or more frequently if you are over 25 years old.  Skin check.  Lung cancer screening. You may have this screening every year starting at age 58 if you have a 30-pack-year history of smoking and currently smoke or have quit within the past 15 years.  Fecal occult blood test (FOBT) of the stool. You may have this test every year starting at age 94.  Flexible sigmoidoscopy or colonoscopy. You may have a sigmoidoscopy every 5 years or a colonoscopy every 10 years starting at age 60.  Prostate cancer screening. Recommendations will vary depending on your family history and other risks.  Hepatitis C blood test.  Hepatitis B blood test.  Sexually transmitted disease (STD) testing.  Diabetes screening. This is done by checking your blood sugar (glucose) after you have not eaten for a while (fasting). You may have this done every 1-3 years.  Abdominal aortic aneurysm (AAA) screening. You may need this if you are a current or former smoker.  Osteoporosis. You may be screened starting at age 27 if you are at high risk. Talk with your health care provider about your test results, treatment options, and if necessary, the need for more tests. Vaccines  Your health care provider may recommend certain  vaccines, such as:  Influenza vaccine. This is recommended every year.  Tetanus, diphtheria, and acellular pertussis (Tdap, Td) vaccine. You may need a Td booster every 10 years.  Zoster vaccine. You may need this after age 72.  Pneumococcal 13-valent  conjugate (PCV13) vaccine. One dose is recommended after age 58.  Pneumococcal polysaccharide (PPSV23) vaccine. One dose is recommended after age 74. Talk to your health care provider about which screenings and vaccines you need and how often you need them. This information is not intended to replace advice given to you by your health care provider. Make sure you discuss any questions you have with your health care provider. Document Released: 02/28/2015 Document Revised: 10/22/2015 Document Reviewed: 12/03/2014 Elsevier Interactive Patient Education  2017 [place] Prevention in the Home Falls can cause injuries. They can happen to people of all ages. There are many things you can do to make your home safe and to help prevent falls. What can I do on the outside of my home?  Regularly fix the edges of walkways and driveways and fix any cracks.  Remove anything that might make you trip as you walk through a door, such as a raised step or threshold.  Trim any bushes or trees on the path to your home.  Use bright outdoor lighting.  Clear any walking paths of anything that might make someone trip, such as rocks or tools.  Regularly check to see if handrails are loose or broken. Make sure that both sides of any steps have handrails.  Any raised decks and porches should have guardrails on the edges.  Have any leaves, snow, or ice cleared regularly.  Use sand or salt on walking paths during winter.  Clean up any spills in your garage right away. This includes oil or grease spills. What can I do in the bathroom?  Use night lights.  Install grab bars by the toilet and in the tub and shower. Do not use towel bars as grab bars.  Use non-skid mats or decals in the tub or shower.  If you need to sit down in the shower, use a plastic, non-slip stool.  Keep the floor dry. Clean up any water that spills on the floor as soon as it happens.  Remove soap buildup in the tub or  shower regularly.  Attach bath mats securely with double-sided non-slip rug tape.  Do not have throw rugs and other things on the floor that can make you trip. What can I do in the bedroom?  Use night lights.  Make sure that you have a light by your bed that is easy to reach.  Do not use any sheets or blankets that are too big for your bed. They should not hang down onto the floor.  Have a firm chair that has side arms. You can use this for support while you get dressed.  Do not have throw rugs and other things on the floor that can make you trip. What can I do in the kitchen?  Clean up any spills right away.  Avoid walking on wet floors.  Keep items that you use a lot in easy-to-reach places.  If you need to reach something above you, use a strong step stool that has a grab bar.  Keep electrical cords out of the way.  Do not use floor polish or wax that makes floors slippery. If you must use wax, use non-skid floor wax.  Do not have throw rugs and other things  on the floor that can make you trip. What can I do with my stairs?  Do not leave any items on the stairs.  Make sure that there are handrails on both sides of the stairs and use them. Fix handrails that are broken or loose. Make sure that handrails are as long as the stairways.  Check any carpeting to make sure that it is firmly attached to the stairs. Fix any carpet that is loose or worn.  Avoid having throw rugs at the top or bottom of the stairs. If you do have throw rugs, attach them to the floor with carpet tape.  Make sure that you have a light switch at the top of the stairs and the bottom of the stairs. If you do not have them, ask someone to add them for you. What else can I do to help prevent falls?  Wear shoes that:  Do not have high heels.  Have rubber bottoms.  Are comfortable and fit you well.  Are closed at the toe. Do not wear sandals.  If you use a stepladder:  Make sure that it is fully  opened. Do not climb a closed stepladder.  Make sure that both sides of the stepladder are locked into place.  Ask someone to hold it for you, if possible.  Clearly mark and make sure that you can see:  Any grab bars or handrails.  First and last steps.  Where the edge of each step is.  Use tools that help you move around (mobility aids) if they are needed. These include:  Canes.  Walkers.  Scooters.  Crutches.  Turn on the lights when you go into a dark area. Replace any light bulbs as soon as they burn out.  Set up your furniture so you have a clear path. Avoid moving your furniture around.  If any of your floors are uneven, fix them.  If there are any pets around you, be aware of where they are.  Review your medicines with your doctor. Some medicines can make you feel dizzy. This can increase your chance of falling. Ask your doctor what other things that you can do to help prevent falls. This information is not intended to replace advice given to you by your health care provider. Make sure you discuss any questions you have with your health care provider. Document Released: 11/28/2008 Document Revised: 07/10/2015 Document Reviewed: 03/08/2014 Elsevier Interactive Patient Education  2017 [name].

## 2016-10-09 LAB — COMPLETE METABOLIC PANEL WITH GFR
ALT: 12 U/L (ref 9–46)
AST: 18 U/L (ref 10–35)
Albumin: 4 g/dL (ref 3.6–5.1)
Alkaline Phosphatase: 91 U/L (ref 40–115)
BUN: 15 mg/dL (ref 7–25)
CHLORIDE: 104 mmol/L (ref 98–110)
CO2: 18 mmol/L — AB (ref 20–32)
CREATININE: 0.89 mg/dL (ref 0.70–1.18)
Calcium: 9.4 mg/dL (ref 8.6–10.3)
GFR, EST NON AFRICAN AMERICAN: 85 mL/min (ref >=60)
Glucose, Bld: 114 mg/dL — ABNORMAL HIGH (ref 65–99)
Potassium: 4.4 mmol/L (ref 3.5–5.3)
SODIUM: 137 mmol/L (ref 135–146)
Total Bilirubin: 0.5 mg/dL (ref 0.2–1.2)
Total Protein: 6.9 g/dL (ref 6.1–8.1)

## 2016-10-09 LAB — HEMOGLOBIN A1C
HEMOGLOBIN A1C: 6.5 % — AB (ref <5.7)
MEAN PLASMA GLUCOSE: 140 mg/dL

## 2016-10-09 LAB — LIPID PANEL
Cholesterol: 182 mg/dL (ref <200)
HDL: 77 mg/dL (ref >40)
LDL CALC: 88 mg/dL (ref <100)
TRIGLYCERIDES: 84 mg/dL (ref <150)
Total CHOL/HDL Ratio: 2.4 Ratio (ref <5.0)
VLDL: 17 mg/dL (ref <30)

## 2016-10-09 LAB — TSH: TSH: 1.43 mIU/L (ref 0.40–4.50)

## 2016-10-13 ENCOUNTER — Ambulatory Visit (INDEPENDENT_AMBULATORY_CARE_PROVIDER_SITE_OTHER): Payer: Medicare HMO | Admitting: Internal Medicine

## 2016-10-13 ENCOUNTER — Encounter: Payer: Self-pay | Admitting: Internal Medicine

## 2016-10-13 VITALS — BP 130/74 | HR 72 | Temp 98.2°F | Ht 68.5 in | Wt 257.0 lb

## 2016-10-13 DIAGNOSIS — N62 Hypertrophy of breast: Secondary | ICD-10-CM | POA: Diagnosis not present

## 2016-10-13 DIAGNOSIS — Z0000 Encounter for general adult medical examination without abnormal findings: Secondary | ICD-10-CM

## 2016-10-13 DIAGNOSIS — E669 Obesity, unspecified: Secondary | ICD-10-CM | POA: Diagnosis not present

## 2016-10-13 DIAGNOSIS — E782 Mixed hyperlipidemia: Secondary | ICD-10-CM

## 2016-10-13 DIAGNOSIS — E1141 Type 2 diabetes mellitus with diabetic mononeuropathy: Secondary | ICD-10-CM | POA: Diagnosis not present

## 2016-10-13 DIAGNOSIS — Z6838 Body mass index (BMI) 38.0-38.9, adult: Secondary | ICD-10-CM

## 2016-10-13 DIAGNOSIS — IMO0001 Reserved for inherently not codable concepts without codable children: Secondary | ICD-10-CM

## 2016-10-13 DIAGNOSIS — C61 Malignant neoplasm of prostate: Secondary | ICD-10-CM

## 2016-10-13 MED ORDER — ATORVASTATIN CALCIUM 10 MG PO TABS: 10.0000 mg | ORAL_TABLET | Freq: Every morning | ORAL | 2 refills | Status: DC

## 2016-10-13 NOTE — Patient Instructions (Signed)
Continue current medications as ordered  Follow up with Oncology and urology as scheduled  Will call with eye referral  Follow up in 6 mos for DM, HTN  Keeping you healthy  Get these tests  Blood pressure- Have your blood pressure checked once a year by your healthcare provider.  Normal blood pressure is 120/80  Weight- Have your body mass index (BMI) calculated to screen for obesity.  BMI is a measure of body fat based on height and weight. You can also calculate your own BMI at ViewBanking.si.  Cholesterol- Have your cholesterol checked every year.  Diabetes- Have your blood sugar checked regularly if you have high blood pressure, high cholesterol, have a family history of diabetes or if you are overweight.  Screening for Colon Cancer- Colonoscopy starting at age 38.  Screening may begin sooner depending on your family history and other health conditions. Follow up colonoscopy as directed by your Gastroenterologist.  Screening for Prostate Cancer- Both blood work (PSA) and a rectal exam help screen for Prostate Cancer.  Screening begins at age 49 with African-[name] men and at age 64 with Caucasian men.  Screening may begin sooner depending on your family history.  Take these medicines  Aspirin- One aspirin daily can help prevent Heart disease and Stroke.  Flu shot- Every fall.  Tetanus- Every 10 years.  Zostavax- Once after the age of 16 to prevent Shingles.  Pneumonia shot- Once after the age of 31; if you are younger than 83, ask your healthcare provider if you need a Pneumonia shot.  Take these steps  Don't smoke- If you do smoke, talk to your doctor about quitting.  For tips on how to quit, go to www.smokefree.gov or call 1-800-QUIT-NOW.  Be physically active- Exercise 5 days a week for at least 30 minutes.  If you are not already physically active start slow and gradually work up to 30 minutes of moderate physical activity.  Examples of moderate activity  include walking briskly, mowing the yard, dancing, swimming, bicycling, etc.  Eat a healthy diet- Eat a variety of healthy food such as fruits, vegetables, low fat milk, low fat cheese, yogurt, lean meant, poultry, fish, beans, tofu, etc. For more information go to www.thenutritionsource.org  Drink alcohol in moderation- Limit alcohol intake to less than two drinks a day. Never drink and drive.  Dentist- Brush and floss twice daily; visit your dentist twice a year.  Depression- Your emotional health is as important as your physical health. If you're feeling down, or losing interest in things you would normally enjoy please talk to your healthcare provider.  Eye exam- Visit your eye doctor every year.  Safe sex- If you may be exposed to a sexually transmitted infection, use a condom.  Seat belts- Seat belts can save your life; always wear one.  Smoke/Carbon Monoxide detectors- These detectors need to be installed on the appropriate level of your home.  Replace batteries at least once a year.  Skin cancer- When out in the sun, cover up and use sunscreen 15 SPF or higher.  Violence- If anyone is threatening you, please tell your healthcare provider.  Living Will/ Health care power of attorney- Speak with your healthcare provider and family.

## 2016-10-13 NOTE — Progress Notes (Signed)
Patient ID: Samuel [name], male   DOB: [birth year], 71 y.o.   MRN: [id number]   Location:  [facility]  Place of Service:  OFFICE  Provider: Arletha Grippe, DO  Patient Care Team: [name], DO as PCP - General (Internal Medicine) Festus Aloe, MD as Consulting Physician (Urology)  Extended Emergency Contact Information Primary Emergency Contact: [facility] Address: [place], [place] [address] Johnnette Litter of [place] Phone: [phone] Mobile Phone: [phone] Relation: Significant other Secondary Emergency Contact: Saunders Glance [place] of [place] Mobile Phone: [phone] Relation: Daughter  Code Status: FULL CODE Goals of Care: Advanced Directive information Advanced Directives 10/08/2016  Does Patient Have a Medical Advance Directive? No  Would patient like information on creating a medical advance directive? Yes (MAU/Ambulatory/Procedural Areas - Information given)     Chief Complaint  Patient presents with  . Annual Exam    Yearly physical, refused EKG, DM foot exam due, AWV completed 10/08/16, discuss labs (copy printed), and MMSE(27/30)  . Health Maintenance    Refused Hep C and all immunizations, order placed for eye exam   . Medication Refill    Refill Lipitor     HPI: Patient is a 71 y.o. male seen in today for an annual wellness exam.  AWV note from 10/08/16 reviewed.  Prostate cancer, recurrent - he has increased urinary frequency since flomax doubled last several weeks. He is currently on Xtandi (May 2018) due to elevated PSA (up to 22.5 in April 2018). PSA now 7.3. Followed by Dr Alen Blew  Hx prostate CA - 1st dx in 2003 and was tx with external beam XRT. It recurred in 2009 and has been getting hormone injections q73mos currently. He no longer takes bicalutamide 50mg  daily for suppression. He has nocturia 3-4 times per night. Followed by urology Dr Junious Silk. PSA 9.6 in June 2017 with total testosterone of 76.2.    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 He does not take any meds. Takes ASA daily. He does not check BS at home. Urine microalbumin/cr ratio 4.0. He is on statin tx.   Hyperlipidemia - stable on lipitor. LDL 88; HDL 77; Exercises 3-4 times per week (walking, squats, sit ups).   Hx Knee DJD - s/p b/l TKR. Followed by Ortho Dr [facility]ona [facility] at [facility]. No issues  Last colonoscopy in 2015 in [place] per pt. cologuard in April 2017 neg  He declines immunizations at this time  Depression screen [facility] 2/9 10/13/2016 10/08/2016 05/21/2015  Decreased Interest 0 0 0  Down, Depressed, Hopeless 0 0 0  PHQ - 2 Score 0 0 0    Fall Risk  10/13/2016 10/08/2016 01/30/2016 09/26/2015 05/21/2015  Falls in the past year? No Yes No No No  Number falls in past yr: - 1 - - -  Injury with Fall? - No - - -   MMSE - Mini Mental State Exam 10/08/2016 05/21/2015  Not completed: - (No Data)  Orientation to time 4 5  Orientation to Place 5 5  Registration 3 3  Attention/ Calculation 5 5  Recall 1 1  Language- name 2 objects 2 2  Language- repeat 1 1  Language- follow 3 step command 3 3  Language- read & follow direction 1 1  Write a sentence 1 1  Copy design 1 1  Total score 27 28     Health  Maintenance  Topic Date Due  . OPHTHALMOLOGY EXAM  03/25/1955  . FOOT EXAM  09/25/2016  . URINE MICROALBUMIN  09/25/2016  . INFLUENZA VACCINE  02/15/2018 (Originally 09/15/2016)  . TETANUS/TDAP  02/15/2018 (Originally 03[birth year]26/1963)  . Hepatitis C Screening  02/15/2018 (Originally 09-01-1945)  . PNA vac Low Risk Adult (1 of 2 - PCV13) 02/15/2018 (Originally 03/25/2009)  . HEMOGLOBIN A1C  04/10/2017  . Fecal DNA (Cologuard)  06/03/2018    Past Medical History:  Diagnosis Date  . Blind left eye   . Cyst in hand 10/2013   left hand, treated with antibiotic & pain medicine  . Diabetes mellitus without complication ([place])    ?? borderline  being checked  . Hepatitis A    25 YRS AGO  .  Prostate cancer ([place])   . Sleep apnea    ????   O2 DESAT  10/26/13  IN ED      Past Surgical History:  Procedure Laterality Date  . EYE SURGERY     LEFT AYE AS CHILD   . JOINT REPLACEMENT     LT KNEE 3 YRS AGO   . 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 TOTAL KNEE ARTHROPLASTY Right 12/04/2013   Procedure: TOTAL KNEE ARTHROPLASTY;  Surgeon: Hessie Dibble, MD;  Location: [facility];  Service: Orthopedics;  Laterality: Right;    Family History  Problem Relation Age of Onset  . Stroke Mother   . Heart attack Mother   . Diabetes Mother   . Stroke Father   . Stomach cancer Brother   . Prostate cancer Brother   . Throat cancer Sister    Family Status  Relation Status  . Mother Deceased at age 49  . Sister Deceased  . Daughter Alive  . Father Deceased at age 73  . Brother Alive  . Sister Deceased  . Brother Deceased  . Brother Deceased  . Sister Deceased  . Brother Alive  . Sister Alive  . Sister Alive  . Brother Alive  . Daughter Alive    Social History   Social History  . Marital status: Widowed    Spouse name: N/A  . Number of children: N/A  . Years of education: N/A   Occupational History  . Not on file.   Social History Main Topics  . Smoking status: Former Smoker    Packs/day: 0.50    Years: 5.00    Types: Cigarettes    Quit date: 02/16/1983  . Smokeless tobacco: Never Used  . Alcohol use No  . Drug use: No  . Sexual activity: Not on file   Other Topics Concern  . 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 Other Other (See Comments)    Patient stated,"if I touch felt (material) I get "knots" on my body."  . Peach [Prunus Persica] Other (See Comments)    Patient stated,"if I touch or eat peach fuzz, I get "knots" all over my face and mouth."     Allergies as of 10/13/2016      Reactions   Other Other (See Comments)   Patient stated,"if I touch felt (material) I get "knots" on my body."   Peach [prunus Persica] Other (See Comments)   Patient stated,"if I touch or eat peach fuzz, I get "knots" all over my face and mouth."       Medication List       Accurate as of 10/13/16  3:02 PM. Always use your most recent med list.          aspirin 325 MG tablet Take 325 mg by mouth daily.   atorvastatin 10 MG tablet Commonly known as:  LIPITOR Take 1 tablet (10 mg total) by mouth every morning.   enzalutamide 40 MG capsule Commonly known as:  XTANDI Take 4 capsules (160 mg total) by mouth daily.   tamsulosin 0.4 MG Caps capsule Commonly known as:  FLOMAX Take 0.4 mg by mouth 2 (two) times daily.   TRELSTAR 11.25 MG injection Generic drug:  triptorelin Inject 11.25 mg into the muscle. Every 6 months        Review of Systems:  Review of Systems  Eyes: Positive for visual disturbance.  Genitourinary: Positive for frequency.  Musculoskeletal: Positive for arthralgias.  All other systems reviewed and are negative.   Physical Exam: Vitals:   10/13/16 1452  BP: 130/74  Pulse: 72  Temp: 98.2 F (36.8 C)  TempSrc: Oral  SpO2: 98%  Weight: 257 lb (116.6 kg)  Height: 5' 8.5" (1.74 m)   Body mass index is 38.51 kg/m. Physical Exam  Constitutional: He is oriented to person, place, and time. He appears well-developed and well-nourished. No distress.  HENT:  Head: Normocephalic and atraumatic.  Right Ear: External ear normal.  Left Ear: External ear normal.    Mouth/Throat: Oropharynx is clear and moist. No oropharyngeal exudate.  MMM; no oral thrush  Eyes: Pupils are equal, round, and reactive to light. EOM are normal. Right eye exhibits no discharge. Left eye exhibits no discharge. No scleral icterus.  OS corneal clouding  Neck: Normal range of motion. Neck supple. Carotid bruit is not present. No tracheal deviation present. No thyromegaly present.  Cardiovascular: Normal rate, regular rhythm and intact distal pulses.  Exam reveals no gallop and no friction rub.   No murmur heard. No LE edema b/l. No calf TTP  Pulmonary/Chest: Effort normal and breath sounds normal. No respiratory distress. He has no wheezes. He has no rales. He exhibits no tenderness. Right breast exhibits no inverted nipple, no mass, no nipple discharge, no skin change and no tenderness. Left breast exhibits no inverted nipple, no mass, no nipple discharge, no skin change and no tenderness. Breasts are symmetrical.    No rhonchi  Abdominal: Soft. Bowel sounds are normal. He exhibits no distension and no mass. There is no hepatosplenomegaly or hepatomegaly. There is no tenderness. There is no rebound and no guarding. No hernia.  obese  Genitourinary:  Genitourinary Comments: Deferred to urology  Musculoskeletal: He exhibits edema. He exhibits no deformity.  Lymphadenopathy:    He has no cervical adenopathy.  Neurological: He is alert and oriented to person, place,  and time. He has normal reflexes.  Skin: Skin is warm and dry. No rash noted.  Psychiatric: He has a normal mood and affect. His behavior is normal. Judgment and thought content normal.  Vitals reviewed.   Labs reviewed:  Basic Metabolic Panel:  Recent Labs  01/28/16 1334  07/06/16 1337 09/14/16 1419 10/08/16 1030  NA 138  < > 141 138 137  K 4.4  < > 4.2 4.0 4.4  CL 105  --   --   --  104  CO2 23  < > 22 22 18*  GLUCOSE 115*  < > 106 104 114*  BUN 18  < > 20.7 16.3 15  CREATININE 0.92  < > 1.1 0.9  0.89  CALCIUM 9.3  < > 9.1 9.4 9.4  TSH  --   --   --   --  1.43  < > = values in this interval not displayed. Liver Function Tests:  Recent Labs  07/06/16 1337 09/14/16 1419 10/08/16 1030  AST 19 18 18   ALT 15 15 12   ALKPHOS 99 96 91  BILITOT 0.42 0.41 0.5  PROT 7.0 7.0 6.9  ALBUMIN 3.8 3.7 4.0   No results for input(s): LIPASE, AMYLASE in the last 8760 hours. No results for input(s): AMMONIA in the last 8760 hours. CBC:  Recent Labs  07/06/16 1337 09/14/16 1419 10/08/16 1030  WBC 6.1 6.3 5.2  NEUTROABS 2.8 3.0 2,236  HGB 13.3 13.3 14.2  HCT 39.9 39.3 41.5  MCV 89.5 90.3 89.6  PLT 210 221 307   Lipid Panel:  Recent Labs  01/28/16 1334 10/08/16 1030  CHOL 161 182  HDL 65 77  LDLCALC 84 88  TRIG 62 84  CHOLHDL 2.5 2.4   Lab Results  Component Value Date   HGBA1C 6.5 (H) 10/08/2016    Procedures: No results found.  Assessment/Plan   ICD-10-CM   1. Well adult exam Z00.00   2. Type 2 diabetes mellitus with diabetic mononeuropathy, without long-term current use of insulin ([place]) E11.41 Ambulatory referral to Ophthalmology  3. Class 2 obesity with serious comorbidity and body mass index (BMI) of 38.0 to 38.9 in adult, unspecified obesity type E66.9    Z68.38   4. Prostate cancer ([place]) C61   5. Gynecomastia, male N40   6. Mixed hyperlipidemia E78.2 atorvastatin (LIPITOR) 10 MG tablet   Continue current medications as ordered  Follow up with Oncology and urology as scheduled  Will call with eye referral  Follow up in 6 mos for DM, HTN - will need lipid panel, a1c  Keeping you healthy handout given    Cordella Register. Perlie Gold  [facility] [address] [place], [place] [address] [phone] Cell (Monday-Friday 8 AM - 5 PM) [phone] After 5 PM and follow prompts

## 2016-10-21 ENCOUNTER — Telehealth: Payer: Self-pay | Admitting: *Deleted

## 2016-10-21 ENCOUNTER — Encounter: Payer: Self-pay | Admitting: *Deleted

## 2016-10-21 NOTE — Telephone Encounter (Signed)
"  Samuel [name] with [facility] Ph: [phone], calling to request a new order for Xtandi 40 mg for this patient.  Please call."  Routing call information to collaborative nurse and provider for review.  Further patient communication through collaborative nurse.

## 2016-10-26 ENCOUNTER — Other Ambulatory Visit: Payer: Self-pay | Admitting: *Deleted

## 2016-10-26 DIAGNOSIS — C61 Malignant neoplasm of prostate: Secondary | ICD-10-CM

## 2016-10-26 MED ORDER — ENZALUTAMIDE 40 MG PO CAPS: 160.0000 mg | ORAL_CAPSULE | Freq: Every day | ORAL | 0 refills | Status: DC

## 2016-11-22 ENCOUNTER — Telehealth: Payer: Self-pay

## 2016-11-22 NOTE — Telephone Encounter (Signed)
[facility] called for xtandi refill.

## 2016-12-15 ENCOUNTER — Other Ambulatory Visit (HOSPITAL_BASED_OUTPATIENT_CLINIC_OR_DEPARTMENT_OTHER): Payer: Medicare HMO

## 2016-12-15 DIAGNOSIS — C61 Malignant neoplasm of prostate: Secondary | ICD-10-CM

## 2016-12-15 LAB — CBC WITH DIFFERENTIAL/PLATELET
BASO%: 0.7 % (ref 0.0–2.0)
Basophils Absolute: 0 10*3/uL (ref 0.0–0.1)
EOS%: 1.2 % (ref 0.0–7.0)
Eosinophils Absolute: 0.1 10*3/uL (ref 0.0–0.5)
HEMATOCRIT: 42.2 % (ref 38.4–49.9)
HEMOGLOBIN: 13.9 g/dL (ref 13.0–17.1)
LYMPH#: 2.8 10*3/uL (ref 0.9–3.3)
LYMPH%: 44.5 % (ref 14.0–49.0)
MCH: 30 pg (ref 27.2–33.4)
MCHC: 33.1 g/dL (ref 32.0–36.0)
MCV: 90.7 fL (ref 79.3–98.0)
MONO#: 0.7 10*3/uL (ref 0.1–0.9)
MONO%: 11.9 % (ref 0.0–14.0)
NEUT%: 41.7 % (ref 39.0–75.0)
NEUTROS ABS: 2.6 10*3/uL (ref 1.5–6.5)
PLATELETS: 231 10*3/uL (ref 140–400)
RBC: 4.65 10*6/uL (ref 4.20–5.82)
RDW: 13.8 % (ref 11.0–14.6)
WBC: 6.3 10*3/uL (ref 4.0–10.3)

## 2016-12-15 LAB — COMPREHENSIVE METABOLIC PANEL
ALBUMIN: 3.7 g/dL (ref 3.5–5.0)
ALK PHOS: 92 U/L (ref 40–150)
ALT: 12 U/L (ref 0–55)
ANION GAP: 10 meq/L (ref 3–11)
AST: 17 U/L (ref 5–34)
BILIRUBIN TOTAL: 0.37 mg/dL (ref 0.20–1.20)
BUN: 13.4 mg/dL (ref 7.0–26.0)
CALCIUM: 9.4 mg/dL (ref 8.4–10.4)
CO2: 19 meq/L — AB (ref 22–29)
Chloride: 107 mEq/L (ref 98–109)
Creatinine: 0.8 mg/dL (ref 0.7–1.3)
Glucose: 104 mg/dl (ref 70–140)
Potassium: 4.4 mEq/L (ref 3.5–5.1)
Sodium: 137 mEq/L (ref 136–145)
TOTAL PROTEIN: 7.3 g/dL (ref 6.4–8.3)

## 2016-12-16 LAB — PSA: Prostate Specific Ag, Serum: 4 ng/mL (ref 0.0–4.0)

## 2016-12-16 LAB — TESTOSTERONE: TESTOSTERONE: 34 ng/dL — AB (ref 264–916)

## 2016-12-21 ENCOUNTER — Other Ambulatory Visit: Payer: Self-pay | Admitting: *Deleted

## 2016-12-21 DIAGNOSIS — C61 Malignant neoplasm of prostate: Secondary | ICD-10-CM

## 2016-12-21 MED ORDER — ENZALUTAMIDE 40 MG PO CAPS: 160.0000 mg | ORAL_CAPSULE | Freq: Every day | ORAL | 0 refills | Status: DC

## 2016-12-22 ENCOUNTER — Ambulatory Visit (HOSPITAL_BASED_OUTPATIENT_CLINIC_OR_DEPARTMENT_OTHER): Payer: Medicare HMO | Admitting: Oncology

## 2016-12-22 ENCOUNTER — Telehealth: Payer: Self-pay | Admitting: Oncology

## 2016-12-22 VITALS — BP 144/73 | HR 85 | Temp 97.7°F | Resp 18 | Ht 68.5 in | Wt 258.9 lb

## 2016-12-22 DIAGNOSIS — E291 Testicular hypofunction: Secondary | ICD-10-CM

## 2016-12-22 DIAGNOSIS — C61 Malignant neoplasm of prostate: Secondary | ICD-10-CM

## 2016-12-22 NOTE — Progress Notes (Signed)
Hematology and Oncology Follow Up Visit  Samuel [name] [id number] 1945/05/13 71 y.o. 12/22/2016 10:22 AM [name], DOCarter, [place], [place]   Principle Diagnosis: 71 year old with prostate cancer. He had a Gleason score 4+4 = 8 and a PSA of 8.97. 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   Prior Therapy: He was treated with external beam radiation and androgen deprivation. He developed recurrent disease in 2010 with a Gleason score 4+4 = 8 and PSA of 8.97.  He has been on combined androgen deprivation with [name] agonist on bicalutamide. PSA in June 2016 was 6.11, in December 2016 was 10, in June 2017 was 9 and in September 2017 was up to 12. Staging workup including CT scan of the abdomen and pelvis as well as bone scan were personally reviewed and showed no evidence of metastatic disease.  Current therapy:   Androgen Deprivation utilizing Trelstar given at [facility] urology.  Xtandi 160 mg daily started in May 2018.  Interim History: Mr. Hedgepath presents today for a follow-up visit. His last visit, he reports doing well overall without issues.  He continues to take [place] and continues to tolerated it well. He denied any nausea, fatigue or edema. He denied any pathological fractures, bone pain or arthralgias.  He reports no changes in his performance status or quality of life.  He attends to activities of daily living.  He denies an she is obtaining this medication or taken it regularly.  He denies any hot flashes or seizures.   He does not report any headaches, blurry vision, syncope. He does not report any fevers, chills, sweats or weight loss. He does not report any chest pain, palpitation, orthopnea or leg edema. He does not report any cough, wheezing or hemoptysis. He does not report any nausea, vomiting or abdominal pain. He does not report any frequency urgency or hesitancy. Does not report a skeletal complaints. Remaining review of systems unremarkable.     Medications: I have reviewed the patient's current medications.  Current Outpatient Medications  Medication Sig Dispense Refill  . aspirin 325 MG tablet Take 325 mg by mouth daily.    Marland Kitchen atorvastatin (LIPITOR) 10 MG tablet Take 1 tablet (10 mg total) by mouth every morning. 90 tablet 2  . enzalutamide (XTANDI) 40 MG capsule Take 4 capsules (160 mg total) daily by mouth. 120 capsule 0  . triptorelin (TRELSTAR) 11.25 MG injection Inject 11.25 mg into the muscle. Every 6 months     No current facility-administered medications for this visit.      Allergies:  Allergies  Allergen Reactions  . Other Other (See Comments)    Patient stated,"if I touch felt (material) I get "knots" on my body."  . Peach [Prunus Persica] Other (See Comments)    Patient stated,"if I touch or eat peach fuzz, I get "knots" all over my face and mouth."     Past Medical History, Surgical history, Social history, and Family History were reviewed and updated.   Physical Exam: Blood pressure (!) 144/73, pulse 85, temperature 97.7 F (36.5 C), temperature source Oral, resp. rate 18, height 5' 8.5" (1.74 m), weight 258 lb 14.4 oz (117.4 kg), SpO2 99 %. ECOG: 0 General appearance: Well-appearing gentleman appears comfortable. Head: Normocephalic, without obvious abnormality no oral thrush noted. Neck: no adenopathy  Lymph nodes: Cervical, supraclavicular, and axillary nodes normal. Heart:regular rate and rhythm, S1, S2 normal, no murmur, click, rub or gallop Lung:chest clear, no wheezing, rales, normal symmetric air entry Abdomin: soft, non-tender, without  masses or organomegaly no shifting dullness or ascites. EXT:no erythema, induration, or nodules   Lab Results: Lab Results  Component Value Date   WBC 6.3 12/15/2016   HGB 13.9 12/15/2016   HCT 42.2 12/15/2016   MCV 90.7 12/15/2016   PLT 231 12/15/2016     Chemistry      Component Value Date/Time   NA 137 12/15/2016 1421   K 4.4 12/15/2016 1421    CL 104 10/08/2016 1030   CO2 19 (L) 12/15/2016 1421   BUN 13.4 12/15/2016 1421   CREATININE 0.8 12/15/2016 1421      Component Value Date/Time   CALCIUM 9.4 12/15/2016 1421   ALKPHOS 92 12/15/2016 1421   AST 17 12/15/2016 1421   ALT 12 12/15/2016 1421   BILITOT 0.37 12/15/2016 1421        Results for KHYLER, ESCHMANN (MRN [id number]) as of 12/22/2016 09:57  Ref. Range 09/14/2016 14:19 12/15/2016 14:22  Prostate Specific Ag, Serum Latest Ref Range: 0.0 - 4.0 ng/mL 7.3 (H) 4.0     Impression and Plan:   71 year old gentleman with the following issues:  1. Prostate cancer dating back to 2003. He was treated with external beam radiation and androgen deprivation. He developed recurrent disease in 2010 with a Gleason score 4+4 = 8 and PSA of 8.97. He was treated with combined androgen deprivation with excellent response initially.  He developed biochemical relapse disease with PSA up to 22.5 on 05/31/2016.   He is currently on Xtandi without complications.  His PSA continues to show excellent response currently at 4.0.  Risks and benefits of continuing this medication was reviewed and is agreeable to continue.  2. Androgen depravation: He continues to receive that at [facility] urology.  This to be continued indefinitely.  3. Follow-up: Will be in 3 months .   Zola Button, MD 11/7/201810:22 AM

## 2016-12-22 NOTE — Telephone Encounter (Signed)
Gave avs and calendar for January and February 2019 

## 2017-01-05 ENCOUNTER — Encounter: Payer: Self-pay | Admitting: *Deleted

## 2017-01-18 ENCOUNTER — Other Ambulatory Visit: Payer: Self-pay | Admitting: Oncology

## 2017-01-18 DIAGNOSIS — C61 Malignant neoplasm of prostate: Secondary | ICD-10-CM

## 2017-01-19 ENCOUNTER — Telehealth: Payer: Self-pay | Admitting: Pharmacy Technician

## 2017-01-19 NOTE — Telephone Encounter (Signed)
Oral Oncology Patient Advocate Encounter  Received communication from [name] that the patient's eligibility in the patient assistance program was due for re-enrollment.  The renewal application has been completed and faxed in an effort to keep the patient's out of pocket expense for Xtandi at $0.    Application completed and faxed to [phone].   [place] phone number for follow up is [phone].   This encounter will be updated until final determination.  Samuel [name]. [name], [place] Patient [place] [phone] 01/19/2017 12:43 PM

## 2017-02-14 ENCOUNTER — Other Ambulatory Visit: Payer: Self-pay | Admitting: *Deleted

## 2017-02-14 DIAGNOSIS — C61 Malignant neoplasm of prostate: Secondary | ICD-10-CM

## 2017-02-14 MED ORDER — ENZALUTAMIDE 40 MG PO CAPS: ORAL_CAPSULE | ORAL | 0 refills | Status: DC

## 2017-02-22 NOTE — Telephone Encounter (Signed)
Oral Oncology Patient Advocate Encounter  Received notification from [employer] that patient has been successfully re-enrolled into their program to continue to receive Xtandi from the manufacturer at $0 out of pocket until 02/14/2018.   I called and left a message for the patient.   Oral Oncology Clinic will continue to follow.  Gilmore Laroche, CPhT, [place] Oral Oncology Patient Advocate [phone] 02/22/2017 12:59 PM

## 2017-03-17 ENCOUNTER — Inpatient Hospital Stay: Payer: Medicare HMO | Attending: Oncology

## 2017-03-17 DIAGNOSIS — C61 Malignant neoplasm of prostate: Secondary | ICD-10-CM | POA: Diagnosis not present

## 2017-03-17 LAB — COMPREHENSIVE METABOLIC PANEL
ALK PHOS: 108 U/L (ref 40–150)
ALT: 11 U/L (ref 0–55)
AST: 14 U/L (ref 5–34)
Albumin: 3.6 g/dL (ref 3.5–5.0)
Anion gap: 10 (ref 3–11)
BILIRUBIN TOTAL: 0.4 mg/dL (ref 0.2–1.2)
BUN: 18 mg/dL (ref 7–26)
CALCIUM: 9.1 mg/dL (ref 8.4–10.4)
CO2: 20 mmol/L — AB (ref 22–29)
CREATININE: 0.98 mg/dL (ref 0.70–1.30)
Chloride: 108 mmol/L (ref 98–109)
GFR calc non Af Amer: >60 mL/min (ref >60)
Glucose, Bld: 171 mg/dL — ABNORMAL HIGH (ref 70–140)
Potassium: 4 mmol/L (ref 3.5–5.1)
SODIUM: 138 mmol/L (ref 136–145)
TOTAL PROTEIN: 7.4 g/dL (ref 6.4–8.3)

## 2017-03-17 LAB — CBC WITH DIFFERENTIAL/PLATELET
BASOS PCT: 1 %
Basophils Absolute: 0.1 10*3/uL (ref 0.0–0.1)
EOS ABS: 0 10*3/uL (ref 0.0–0.5)
Eosinophils Relative: 1 %
HCT: 40.9 % (ref 38.4–49.9)
HEMOGLOBIN: 13.4 g/dL (ref 13.0–17.1)
Lymphocytes Relative: 35 %
Lymphs Abs: 2.4 10*3/uL (ref 0.9–3.3)
MCH: 29.9 pg (ref 27.2–33.4)
MCHC: 32.8 g/dL (ref 32.0–36.0)
MCV: 91.2 fL (ref 79.3–98.0)
MONO ABS: 0.6 10*3/uL (ref 0.1–0.9)
MONOS PCT: 9 %
NEUTROS PCT: 54 %
Neutro Abs: 3.8 10*3/uL (ref 1.5–6.5)
Platelets: 226 10*3/uL (ref 140–400)
RBC: 4.49 MIL/uL (ref 4.20–5.82)
RDW: 13.8 % (ref 11.0–14.6)
WBC: 6.8 10*3/uL (ref 4.0–10.3)

## 2017-03-18 LAB — PROSTATE-SPECIFIC AG, SERUM (LABCORP): Prostate Specific Ag, Serum: 2.8 ng/mL (ref 0.0–4.0)

## 2017-03-21 ENCOUNTER — Other Ambulatory Visit: Payer: Self-pay | Admitting: *Deleted

## 2017-03-21 DIAGNOSIS — C61 Malignant neoplasm of prostate: Secondary | ICD-10-CM

## 2017-03-21 MED ORDER — ENZALUTAMIDE 40 MG PO CAPS: ORAL_CAPSULE | ORAL | 0 refills | Status: DC

## 2017-03-24 ENCOUNTER — Telehealth: Payer: Self-pay | Admitting: Oncology

## 2017-03-24 ENCOUNTER — Inpatient Hospital Stay: Payer: Medicare HMO | Attending: Oncology | Admitting: Oncology

## 2017-03-24 VITALS — BP 140/72 | HR 78 | Temp 98.5°F | Resp 18 | Ht 68.5 in | Wt 264.7 lb

## 2017-03-24 DIAGNOSIS — C61 Malignant neoplasm of prostate: Secondary | ICD-10-CM

## 2017-03-24 DIAGNOSIS — Z923 Personal history of irradiation: Secondary | ICD-10-CM | POA: Diagnosis not present

## 2017-03-24 DIAGNOSIS — Z7982 Long term (current) use of aspirin: Secondary | ICD-10-CM

## 2017-03-24 DIAGNOSIS — Z79899 Other long term (current) drug therapy: Secondary | ICD-10-CM | POA: Diagnosis not present

## 2017-03-24 NOTE — Progress Notes (Signed)
Hematology and Oncology Follow Up Visit  Samuel [name] [id number] December 03, 1945 72 y.o. 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  His initial diagnosis in 2010 with Gleason score 4+4 = 8 and a PSA of 8.97.   Prior Therapy: He was treated with external beam radiation and androgen deprivation.  He developed biochemical relapse with a PSA rise and he has been on combined androgen deprivation with [place] agonist on bicalutamide. PSA in June 2016 was 6.11, in December 2016 was 10, in June 2017 was 9 and in September 2017 was up to 12.  Staging workup including CT scan of the abdomen and pelvis as well as bone scan were personally reviewed and showed no evidence of metastatic disease. He was in observation and surveillance till May 2018 when his PSA was up to 22.5.  Current therapy:   Androgen Deprivation utilizing Trelstar given at [facility] urology.  Xtandi 160 mg daily started in May 2018.  Interim History: Samuel [name] is here for a follow-up.  He continues to take Xtandi without any major side effects.  He denies any excessive fatigue, tiredness, edema or increase in his blood pressure. He denied any pathological fractures, bone pain or arthralgias.  He denies any hot flashes or genitourinary complaints including hematuria or dysuria.  His performance status and quality of life remain excellent.  His appetite is about the same and his weight is unchanged.  He continues to receive Trelstar at [facility] urology without new complications.  He does not report any headaches, blurry vision, syncope. He does not report any fevers, chills, sweats or weight loss. He does not report any chest pain, palpitation, orthopnea or leg edema. He does not report any cough, wheezing or hemoptysis. He does not report any nausea, vomiting or abdominal pain. He does not report any frequency urgency or  hesitancy.  He denies any heat or cold intolerance.  He does not report any skin rashes or lesions.  No lymphadenopathy or petechiae.  Does not report a skeletal complaints. Remaining review of systems negative.  Medications: I have reviewed the patient's current medications.  Current Outpatient Medications  Medication Sig Dispense Refill  . aspirin 325 MG tablet Take 325 mg by mouth daily.    Marland Kitchen atorvastatin (LIPITOR) 10 MG tablet Take 1 tablet (10 mg total) by mouth every morning. 90 tablet 2  . enzalutamide (XTANDI) 40 MG capsule Take 4 capsules (160 mg total) daily by mouth. 120 capsule 0  . triptorelin (TRELSTAR) 11.25 MG injection Inject 11.25 mg into the muscle. Every 6 months     No current facility-administered medications for this visit.      Allergies:  Allergies  Allergen Reactions  . Other Other (See Comments)    Patient stated,"if I touch felt (material) I get "knots" on my body."  . Peach [Prunus Persica] Other (See Comments)    Patient stated,"if I touch or eat peach fuzz, I get "knots" all over my face and mouth."     Past Medical History, Surgical history, Social history, and Family History were reviewed and updated.   Physical Exam: Blood pressure 140/72, pulse 78, temperature 98.5 F (36.9 C), temperature source Oral, resp. rate 18, height 5' 8.5" (1.74 m), weight 264 lb 11.2 oz (120.1 kg), SpO2 100 %. ECOG: 0 General appearance: Alert, awake gentleman without distress. Head: Normocephalic, without obvious abnormality  Oropharynx: Mucous membranes are moist and pink no oral  thrush noted. Eyes: No scleral icterus. Lymph nodes: Cervical, supraclavicular, and axillary nodes normal. Heart:regular rate and rhythm, S1, S2 normal, no murmur, click, rub or gallop Lung: Clear to auscultation without rhonchi or wheezes or dullness to percussion.  Abdomin: Soft, nontender without rebound or guarding. Musculoskeletal: No joint deformity or effusion.   Lab Results: Lab  Results  Component Value Date   WBC 6.8 03/17/2017   HGB 13.4 03/17/2017   HCT 40.9 03/17/2017   MCV 91.2 03/17/2017   PLT 226 03/17/2017     Chemistry      Component Value Date/Time   NA 138 03/17/2017 1218   NA 137 12/15/2016 1421   K 4.0 03/17/2017 1218   K 4.4 12/15/2016 1421   CL 108 03/17/2017 1218   CO2 20 (L) 03/17/2017 1218   CO2 19 (L) 12/15/2016 1421   BUN 18 03/17/2017 1218   BUN 13.4 12/15/2016 1421   CREATININE 0.98 03/17/2017 1218   CREATININE 0.8 12/15/2016 1421      Component Value Date/Time   CALCIUM 9.1 03/17/2017 1218   CALCIUM 9.4 12/15/2016 1421   ALKPHOS 108 03/17/2017 1218   ALKPHOS 92 12/15/2016 1421   AST 14 03/17/2017 1218   AST 17 12/15/2016 1421   ALT 11 03/17/2017 1218   ALT 12 12/15/2016 1421   BILITOT 0.4 03/17/2017 1218   BILITOT 0.37 12/15/2016 1421       Results for Samuel [name] (MRN [id number]) as of 03/24/2017 10:09  Ref. Range 12/15/2016 14:22 03/17/2017 12:18  Prostate Specific Ag, Serum Latest Ref Range: 0.0 - 4.0 ng/mL 4.0 2.8       Impression and Plan:   72 year old gentleman with the following issues:  1.  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  His initial diagnosis was in 2003. He was treated with external beam radiation and androgen deprivation.   He developed recurrent disease in 2010 with a Gleason score 4+4 = 8 and PSA of 8.97. He was treated with combined androgen deprivation and developed castration resistant disease in April 2018.  He is currently on Xtandi since that time and continues to enjoy excellent response.  His PSA is currently 2.8 which is further drop from previous measurements.  The rationale for continuing this medication as well as the natural course of his disease was reviewed today.  Complication associated with long-term therapy was reviewed today and he is agreeable to continue.  2. Androgen depravation: He received Trelstar at [facility] urology.  Complication associated  with this medication was reviewed today and the rationale for long-term androgen deprivation was discussed.  He is agreeable to continue at this time.  3. Follow-up: Will be in 3 months .   15  minutes was spent with the patient face-to-face today.  More than 50% of time was dedicated to patient counseling, education and answering questioning regarding diagnosis and future treatment options.  Zola Button, MD 2/7/201910:17 AM

## 2017-03-24 NOTE — Telephone Encounter (Signed)
Gave patient avs and calendar with appts per 2/7 los. Put on 5/8 due to pt wanting to come in earlier.

## 2017-04-04 DIAGNOSIS — C61 Malignant neoplasm of prostate: Secondary | ICD-10-CM | POA: Diagnosis not present

## 2017-04-04 DIAGNOSIS — R3912 Poor urinary stream: Secondary | ICD-10-CM | POA: Diagnosis not present

## 2017-04-13 ENCOUNTER — Ambulatory Visit: Payer: Self-pay | Admitting: Internal Medicine

## 2017-04-18 DIAGNOSIS — C61 Malignant neoplasm of prostate: Secondary | ICD-10-CM | POA: Diagnosis not present

## 2017-04-21 ENCOUNTER — Other Ambulatory Visit: Payer: Self-pay | Admitting: *Deleted

## 2017-04-21 DIAGNOSIS — C61 Malignant neoplasm of prostate: Secondary | ICD-10-CM

## 2017-04-21 MED ORDER — ENZALUTAMIDE 40 MG PO CAPS: ORAL_CAPSULE | ORAL | 0 refills | Status: DC

## 2017-05-10 ENCOUNTER — Encounter: Payer: Self-pay | Admitting: Internal Medicine

## 2017-05-10 DIAGNOSIS — H2511 Age-related nuclear cataract, right eye: Secondary | ICD-10-CM | POA: Diagnosis not present

## 2017-05-10 DIAGNOSIS — H18422 Band keratopathy, left eye: Secondary | ICD-10-CM | POA: Diagnosis not present

## 2017-05-10 DIAGNOSIS — E119 Type 2 diabetes mellitus without complications: Secondary | ICD-10-CM | POA: Diagnosis not present

## 2017-05-10 LAB — HM DIABETES EYE EXAM

## 2017-05-11 ENCOUNTER — Encounter: Payer: Self-pay | Admitting: Internal Medicine

## 2017-05-11 ENCOUNTER — Ambulatory Visit (INDEPENDENT_AMBULATORY_CARE_PROVIDER_SITE_OTHER): Payer: Medicare HMO | Admitting: Internal Medicine

## 2017-05-11 VITALS — BP 150/84 | HR 80 | Temp 97.5°F | Ht 69.0 in | Wt 270.0 lb

## 2017-05-11 DIAGNOSIS — Z9189 Other specified personal risk factors, not elsewhere classified: Secondary | ICD-10-CM | POA: Diagnosis not present

## 2017-05-11 DIAGNOSIS — R030 Elevated blood-pressure reading, without diagnosis of hypertension: Secondary | ICD-10-CM

## 2017-05-11 DIAGNOSIS — E1141 Type 2 diabetes mellitus with diabetic mononeuropathy: Secondary | ICD-10-CM

## 2017-05-11 DIAGNOSIS — C61 Malignant neoplasm of prostate: Secondary | ICD-10-CM | POA: Diagnosis not present

## 2017-05-11 DIAGNOSIS — Z1159 Encounter for screening for other viral diseases: Secondary | ICD-10-CM | POA: Diagnosis not present

## 2017-05-11 DIAGNOSIS — E782 Mixed hyperlipidemia: Secondary | ICD-10-CM | POA: Diagnosis not present

## 2017-05-11 NOTE — Patient Instructions (Addendum)
Continue current medications as ordered  Maintain low salt diet. Repeat blood pressure next visit  Follow up with urology as scheduled  Follow up in 5 mos for CPE/ECG. Fasting labs prior to appt

## 2017-05-11 NOTE — Progress Notes (Signed)
Patient ID: Samuel [name], male   DOB: 1945-06-15, 72 y.o.   MRN: [id number]   Location:  [facility] OFFICE  Provider: DR Arletha Grippe  Code Status:  Goals of Care:  Advanced Directives 05/11/2017  Does Patient Have a Medical Advance Directive? No  Would patient like information on creating a medical advance directive? No - Patient declined     Chief Complaint  Patient presents with  . Medical Management of Chronic Issues    6 month follow-up, DM foot exam, A1c and MALB due  . Medication Refill    No refills needed   . Immunizations    Refused all vaccine recommendations   . Health Maintenance    Last eye exam- yesterday (Dr.Groat), per patient Dr.Groat will send correspondence. Discuss need for Hep C     HPI: Patient is a 72 y.o. male seen today for medical management of chronic diseases.  He has no concerns.  Prostate cancer, recurrent - he has increased urinary frequency since flomax doubled last several weeks. He is currently on Xtandi (May 2018) due to elevated PSA (up to 22.5 in April 2018). PSA now 7.3. Followed by Dr Alen Blew. He was 1st dx in 2003 and was tx with external beam XRT. It recurred in 2009 and has been getting hormone injections q43mos currently. He no longer takes bicalutamide 50mg  daily for suppression. He has nocturia 3-4 times per night.  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 A1c 6.5%.Takes ASA daily. He does not check BS at home. Urine microalbumin/cr ratio 4.0. He is on statin tx.   Hyperlipidemia - at goal on lipitor. LDL 88; HDL 77; Exercises 3-4 times per week (walking, squats, sit ups).   Hx Knee DJD - s/p b/l TKR. Followed by Ortho Dr Rhona Raider at [facility]. No issues  Last colonoscopy in 2015 in [place] per pt. cologuard in April 2017 neg    Past Medical History:  Diagnosis Date  . Blind left eye   . Cyst in hand 10/2013   left hand, treated with antibiotic & pain medicine  . Diabetes mellitus without  complication ([place])    ?? borderline  being checked  . Hepatitis A    25 YRS AGO  . Prostate cancer ([place])   . Sleep apnea    ????   O2 DESAT  10/26/13  IN ED      Past Surgical History:  Procedure Laterality Date  . EYE SURGERY     LEFT AYE AS CHILD   . JOINT REPLACEMENT     LT KNEE 3 YRS AGO   . 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 TOTAL KNEE ARTHROPLASTY Right 12/04/2013   Procedure: TOTAL KNEE ARTHROPLASTY;  Surgeon: Hessie Dibble, MD;  Location: [facility];  Service: Orthopedics;  Laterality: Right;     reports that he quit smoking about 34 years ago. His smoking use included cigarettes. He has a 2.50 pack-year smoking history. He has never used smokeless tobacco. He reports that he does not drink alcohol or use drugs. Social History   Socioeconomic History  . Marital status: Widowed    Spouse name: Not on file  . Number of children: Not on file  . Years of education: Not on file  . Highest education level: Not on file  Occupational History  . Not on file  Social Needs  . Financial resource strain: Not on file  .  Food insecurity:    Worry: Not on file    Inability: Not on file  . Transportation needs:    Medical: Not on file    Non-medical: Not on file  Tobacco Use  . Smoking status: Former Smoker    Packs/day: 0.50    Years: 5.00    Pack years: 2.50    Types: Cigarettes    Last attempt to quit: 02/16/1983    Years since quitting: 34.2  . Smokeless tobacco: Never Used  Substance and Sexual Activity  . Alcohol use: No    Alcohol/week: 0.0 oz  . Drug use: No  . Sexual activity: Not on file  Lifestyle  . Physical activity:    Days per week: Not on file    Minutes per session: Not on file  . Stress: Not on file  Relationships  . Social connections:    Talks on phone: Not on file    Gets together: Not on file    Attends religious service: Not on file    Active member of club or organization: Not on file    Attends meetings of clubs or  organizations: Not on file    Relationship status: Not on file  . Intimate partner violence:    Fear of current or ex partner: Not on file    Emotionally abused: Not on file    Physically abused: Not on file    Forced sexual activity: Not on file  Other Topics Concern  . 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 Stroke Mother   . Heart attack Mother   . Diabetes Mother   . Stroke Father   . Stomach cancer Brother   . Prostate cancer Brother   . Throat cancer Sister     Allergies  Allergen Reactions  . Other Other (See Comments)    Patient stated,"if I touch felt (material) I get "knots" on my body."  . Peach [Prunus Persica] Other (See Comments)    Patient stated,"if I touch or eat peach fuzz, I get "knots" all over my face and mouth."     Outpatient Encounter Medications as of 05/11/2017  Medication Sig  . alfuzosin (UROXATRAL) 10 MG 24 hr tablet 1 by mouth daily for bladder  . aspirin 325 MG tablet Take 325 mg by mouth daily.  Marland Kitchen atorvastatin (LIPITOR) 10 MG tablet Take 1 tablet (10 mg total) by mouth every morning.  . enzalutamide (XTANDI) 40 MG capsule Take 4 capsules (160 mg total) daily by mouth.  . triptorelin (TRELSTAR) 11.25 MG injection Inject 11.25 mg into the muscle. Every 6 months   No facility-administered encounter medications on file as of  05/11/2017.     Review of Systems:  Review of Systems    Genitourinary: Positive for frequency.  Musculoskeletal: Positive for arthralgias.  All other systems reviewed and are negative.   Health Maintenance  Topic Date Due  . OPHTHALMOLOGY EXAM  03/25/1955  . FOOT EXAM  09/25/2016  . URINE MICROALBUMIN  09/25/2016  . HEMOGLOBIN A1C  04/10/2017  . INFLUENZA VACCINE  02/15/2018 (Originally 09/15/2016)  . TETANUS/TDAP  02/15/2018 (Originally 03/26/1963)  . Hepatitis C Screening  02/15/2018 (Originally 09-07-1945)  . PNA vac Low Risk Adult (1 of 2 - PCV13) 02/15/2018 (Originally 03/25/2009)  . Fecal DNA (Cologuard)  06/03/2018    Physical Exam: Vitals:   05/11/17 1407  BP: (!) 150/84  Pulse: 80  Temp: (!) 97.5 F (36.4 C)  TempSrc: Oral  SpO2: 94%  Weight: 270 lb (122.5 kg)  Height: 5\' 9"  (1.753 m)   Body mass index is 39.87 kg/m. Physical Exam  Constitutional: He is oriented to person, place, and time. He appears well-developed and well-nourished.  HENT:  Mouth/Throat: Oropharynx is clear and moist.  MMM; no oral thrush  Eyes: No scleral icterus.  OS corneal clouding; OD PERRL  Neck: Neck supple. Carotid bruit is not present. No thyromegaly present.  Cardiovascular: Normal rate, regular rhythm and intact distal pulses. Exam reveals no gallop and no friction rub.  Murmur (1/6 SEM) heard. No distal LE edema. No calf TTP  Pulmonary/Chest: Effort normal and breath sounds normal. He has no wheezes. He has no rales. He exhibits no tenderness.  Abdominal: Soft. Normal appearance and bowel sounds are normal. He exhibits no distension, no abdominal bruit, no pulsatile midline mass and no mass. There is no hepatomegaly. There is no tenderness. There is no rigidity, no rebound and no guarding. No hernia.  Musculoskeletal: He exhibits edema.  Lymphadenopathy:    He has no cervical adenopathy.  Neurological: He is alert and oriented to person, place, and time. He has normal reflexes.  Skin: Skin is warm and dry. No rash noted.  Psychiatric: He  has a normal mood and affect. His behavior is normal. Judgment and thought content normal.    Labs reviewed: Basic Metabolic Panel: Recent Labs    10/08/16 1030 12/15/16 1421 03/17/17 1218  NA 137 137 138  K 4.4 4.4 4.0  CL 104  --  108  CO2 18* 19* 20*  GLUCOSE 114* 104 171*  BUN 15 13.4 18  CREATININE 0.89 0.8 0.98  CALCIUM 9.4 9.4 9.1  TSH 1.43  --   --    Liver Function Tests: Recent Labs    10/08/16 1030 12/15/16 1421 03/17/17 1218  AST 18 17 14   ALT 12 12 11   ALKPHOS 91 92 108  BILITOT 0.5 0.37 0.4  PROT 6.9 7.3 7.4  ALBUMIN 4.0 3.7 3.6   No results for input(s): LIPASE, AMYLASE in the last 8760 hours. No results for input(s): AMMONIA in the last 8760 hours. CBC: Recent Labs    10/08/16 1030 12/15/16 1421 03/17/17 1218  WBC 5.2 6.3 6.8  NEUTROABS 2,236 2.6 3.8  HGB 14.2 13.9 13.4  HCT 41.5 42.2 40.9  MCV 89.6 90.7 91.2  PLT 307 231 226   Lipid Panel: Recent Labs    10/08/16 1030  CHOL 182  HDL 77  LDLCALC 88  TRIG 84  CHOLHDL 2.4   Lab Results  Component Value Date   HGBA1C 6.5 (H) 10/08/2016    Procedures since last visit: No results found.  Assessment/Plan  ICD-10-CM   1. Type 2 diabetes mellitus with diabetic mononeuropathy, without long-term current use of insulin (HCC) E11.41 Hemoglobin A1c    Microalbumin/Creatinine Ratio, Urine  2. Mixed hyperlipidemia E78.2 Lipid Panel  3. Encounter for hepatitis C virus screening test for high risk patient Z11.59 Hep C Antibody   Z91.89   4. Prostate cancer ([place]) C61   5. Elevated BP without diagnosis of hypertension R03.0      Needs fasting lipid panel, a1c and hep c Ab screen, urine micro/Cr ratio - he declines labs today  Continue current medications as ordered  Maintain low salt diet. Repeat blood pressure next visit  Follow up with urology as scheduled  Follow up in 5 mos for CPE/ECG. Fasting labs prior to appt   [place] S. Perlie Gold  [facility]  and Adult Medicine [address] [place], [place] [address] [phone] Cell (Monday-Friday 8 AM - 5 PM) [phone] After 5 PM and follow prompts

## 2017-05-13 ENCOUNTER — Encounter: Payer: Self-pay | Admitting: Internal Medicine

## 2017-05-18 ENCOUNTER — Other Ambulatory Visit: Payer: Self-pay | Admitting: Oncology

## 2017-05-18 DIAGNOSIS — C61 Malignant neoplasm of prostate: Secondary | ICD-10-CM

## 2017-06-14 ENCOUNTER — Inpatient Hospital Stay: Payer: Medicare HMO | Attending: Oncology

## 2017-06-14 DIAGNOSIS — C61 Malignant neoplasm of prostate: Secondary | ICD-10-CM | POA: Insufficient documentation

## 2017-06-14 LAB — CMP (CANCER CENTER ONLY)
ALBUMIN: 4 g/dL (ref 3.5–5.0)
ALT: 46 U/L (ref 0–55)
ANION GAP: 8 (ref 3–11)
AST: 29 U/L (ref 5–34)
Alkaline Phosphatase: 115 U/L (ref 40–150)
BILIRUBIN TOTAL: 0.4 mg/dL (ref 0.2–1.2)
BUN: 19 mg/dL (ref 7–26)
CO2: 21 mmol/L — ABNORMAL LOW (ref 22–29)
Calcium: 9.9 mg/dL (ref 8.4–10.4)
Chloride: 110 mmol/L — ABNORMAL HIGH (ref 98–109)
Creatinine: 0.94 mg/dL (ref 0.70–1.30)
GFR, Est AFR Am: >60 mL/min (ref >60)
Glucose, Bld: 117 mg/dL (ref 70–140)
POTASSIUM: 4 mmol/L (ref 3.5–5.1)
Sodium: 139 mmol/L (ref 136–145)
TOTAL PROTEIN: 7.5 g/dL (ref 6.4–8.3)

## 2017-06-14 LAB — CBC WITH DIFFERENTIAL (CANCER CENTER ONLY)
BASOS ABS: 0.1 10*3/uL (ref 0.0–0.1)
Basophils Relative: 1 %
Eosinophils Absolute: 0.1 10*3/uL (ref 0.0–0.5)
Eosinophils Relative: 1 %
HEMATOCRIT: 40.9 % (ref 38.4–49.9)
Hemoglobin: 13.6 g/dL (ref 13.0–17.1)
LYMPHS PCT: 40 %
Lymphs Abs: 2.6 10*3/uL (ref 0.9–3.3)
MCH: 30.4 pg (ref 27.2–33.4)
MCHC: 33.3 g/dL (ref 32.0–36.0)
MCV: 91.3 fL (ref 79.3–98.0)
Monocytes Absolute: 0.7 10*3/uL (ref 0.1–0.9)
Monocytes Relative: 11 %
NEUTROS ABS: 3.1 10*3/uL (ref 1.5–6.5)
Neutrophils Relative %: 47 %
Platelet Count: 225 10*3/uL (ref 140–400)
RBC: 4.48 MIL/uL (ref 4.20–5.82)
RDW: 14.5 % (ref 11.0–14.6)
WBC: 6.6 10*3/uL (ref 4.0–10.3)

## 2017-06-15 LAB — PROSTATE-SPECIFIC AG, SERUM (LABCORP): Prostate Specific Ag, Serum: 3 ng/mL (ref 0.0–4.0)

## 2017-06-22 ENCOUNTER — Other Ambulatory Visit: Payer: Self-pay | Admitting: *Deleted

## 2017-06-22 ENCOUNTER — Inpatient Hospital Stay: Payer: Medicare HMO | Attending: Oncology | Admitting: Oncology

## 2017-06-22 ENCOUNTER — Telehealth: Payer: Self-pay | Admitting: Oncology

## 2017-06-22 VITALS — BP 153/71 | HR 73 | Temp 98.2°F | Resp 18 | Wt 270.6 lb

## 2017-06-22 DIAGNOSIS — C61 Malignant neoplasm of prostate: Secondary | ICD-10-CM

## 2017-06-22 DIAGNOSIS — Z192 Hormone resistant malignancy status: Secondary | ICD-10-CM | POA: Diagnosis not present

## 2017-06-22 DIAGNOSIS — Z7982 Long term (current) use of aspirin: Secondary | ICD-10-CM | POA: Insufficient documentation

## 2017-06-22 DIAGNOSIS — Z923 Personal history of irradiation: Secondary | ICD-10-CM | POA: Diagnosis not present

## 2017-06-22 DIAGNOSIS — Z79899 Other long term (current) drug therapy: Secondary | ICD-10-CM | POA: Diagnosis not present

## 2017-06-22 MED ORDER — ENZALUTAMIDE 40 MG PO CAPS: ORAL_CAPSULE | ORAL | 0 refills | Status: DC

## 2017-06-22 NOTE — Telephone Encounter (Signed)
Appointments scheduled AVS/Calendar printed per 5/8 los °

## 2017-06-22 NOTE — Progress Notes (Signed)
Hematology and Oncology Follow Up Visit  Samuel [name] [id number] [birth year] 72 y.o. 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  He was diagnosed in 2010 with Gleason score 4+4 = 8 and a PSA of 8.97.  He has biochemical relapse only.  Prior Therapy: He was treated with external beam radiation and androgen deprivation.  He developed biochemical relapse with a PSA rise and he has been on combined androgen deprivation with [place] agonist on bicalutamide. PSA in June 2016 was 6.11, in December 2016 was 10, in June 2017 was 9 and in September 2017 was up to 12.  Staging workup including CT scan of the abdomen and pelvis as well as bone scan were personally reviewed and showed no evidence of metastatic disease.  His PSA was up to 22.5 in April 2018.   Current therapy:   Androgen Deprivation utilizing Trelstar given at [facility] u[facility].  Xtandi 160 mg daily started in May 2018.  Interim History: [name] presents today for a follow-up visit.  Since the last visit, he reports no major changes in his health.  He continues to take [facility] and does not report any complications related to it.  He denies any excessive fatigue or tiredness, hematuria or bone pain.  He denies any pathological fractures or decline in his performance status.  His appetite and quality of life remain excellent.   He does not report any headaches, blurry vision, syncope or seizures.  He denies any neurological deficits.  He does not report any fevers, chills, sweats or weight loss. He does not report any chest pain, palpitation, orthopnea or leg edema. He does not report any cough, wheezing or hemoptysis. He does not report any nausea, vomiting or abdominal pain. He does not report any frequency urgency or hesitancy.  He does not report any skin rashes or lesions.  No lymphadenopathy or petechiae.  Does not report a skeletal  complaints. Remaining review of systems negative.  Medications: I have reviewed the patient's current medications.  Current Outpatient Medications  Medication Sig Dispense Refill  . alfuzosin (UROXATRAL) 10 MG 24 hr tablet 1 by mouth daily for bladder    . aspirin 325 MG tablet Take 325 mg by mouth daily.    Marland Kitchen atorvastatin (LIPITOR) 10 MG tablet Take 1 tablet (10 mg total) by mouth every morning. 90 tablet 2  . triptorelin (TRELSTAR) 11.25 MG injection Inject 11.25 mg into the muscle. Every 6 months    . XTANDI 40 MG capsule Take 4 capsules (160mg ) by mouth once daily as directed by physician. 120 capsule 0   No current facility-administered medications for this visit.      Allergies:  Allergies  Allergen Reactions  . Other Other (See Comments)    Patient stated,"if I touch felt (material) I get "knots" on my body."  . Peach [Prunus Persica] Other (See Comments)    Patient stated,"if I touch or eat peach fuzz, I get "knots" all over my face and mouth."     Past Medical History, Surgical history, Social history, and Family History without any changes on review.   Physical Exam: There were no vitals taken for this visit.   ECOG: 0 General appearance: Well-appearing gentleman without distress. Head: Atraumatic without abnormalities. Oropharynx: Mucous membranes without any thrush or ulcers. Eyes: Pupils are equal and round reactive to light. Lymph nodes: No cervical, supraclavicular, and axillary nodes  Heart:regular rate and rhythm, without murmurs or  gallops. Lung: Clear without any rhonchi, wheezes or dullness to percussion. Abdomin: Soft, nontender without any rebound or guarding. Musculoskeletal: No clubbing or cyanosis. Skin: No rashes or lesions.   Lab Results: Lab Results  Component Value Date   WBC 6.6 06/14/2017   HGB 13.6 06/14/2017   HCT 40.9 06/14/2017   MCV 91.3 06/14/2017   PLT 225 06/14/2017     Chemistry      Component Value Date/Time   NA 139  06/14/2017 1255   NA 137 12/15/2016 1421   K 4.0 06/14/2017 1255   K 4.4 12/15/2016 1421   CL 110 (H) 06/14/2017 1255   CO2 21 (L) 06/14/2017 1255   CO2 19 (L) 12/15/2016 1421   BUN 19 06/14/2017 1255   BUN 13.4 12/15/2016 1421   CREATININE 0.94 06/14/2017 1255   CREATININE 0.8 12/15/2016 1421      Component Value Date/Time   CALCIUM 9.9 06/14/2017 1255   CALCIUM 9.4 12/15/2016 1421   ALKPHOS 115 06/14/2017 1255   ALKPHOS 92 12/15/2016 1421   AST 29 06/14/2017 1255   AST 17 12/15/2016 1421   ALT 46 06/14/2017 1255   ALT 12 12/15/2016 1421   BILITOT 0.4 06/14/2017 1255   BILITOT 0.37 12/15/2016 1421          Results for Samuel [name], Samuel [name] (MRN [id number]) as of 06/22/2017 09:50  Ref. Range 12/15/2016 14:22 03/17/2017 12:18 06/14/2017 12:55  Prostate Specific Ag, Serum Latest Ref Range: 0.0 - 4.0 ng/mL 4.0 2.8 3.0     Impression and Plan:   72 year old man with  1.  Castration-resistant prostate cancer with initial diagnosis 2010.  He developed a biochemical relapse in 2018.  He is currently on Xtandi and if tolerated this medication reasonably well.  His PSA continues to show excellent response currently at 3.0.  Risks and benefits of continuing this medication was reviewed today and is agreeable to continue.  We will continue to monitor his PSA closely and repeat staging work-up if his PSA starts to rise.    2. Androgen depravation: I recommended continuing androgen deprivation long-term.  Long-term complication associated with this medication was reviewed today and is agreeable to continue.   3. Follow-up: Will be in 3 months .   15  minutes was spent with the patient face-to-face today.  More than 50% of time was dedicated to patient counseling, education and coordination of his future plan of care.  Zola Button, MD 5/8/201910:00 AM

## 2017-07-20 ENCOUNTER — Encounter: Payer: Self-pay | Admitting: Internal Medicine

## 2017-07-27 ENCOUNTER — Other Ambulatory Visit: Payer: Self-pay | Admitting: *Deleted

## 2017-07-27 DIAGNOSIS — C61 Malignant neoplasm of prostate: Secondary | ICD-10-CM

## 2017-07-27 MED ORDER — ENZALUTAMIDE 40 MG PO CAPS: ORAL_CAPSULE | ORAL | 0 refills | Status: DC

## 2017-08-15 ENCOUNTER — Other Ambulatory Visit: Payer: Self-pay | Admitting: *Deleted

## 2017-08-15 DIAGNOSIS — E782 Mixed hyperlipidemia: Secondary | ICD-10-CM

## 2017-08-15 MED ORDER — ATORVASTATIN CALCIUM 10 MG PO TABS: 10.0000 mg | ORAL_TABLET | Freq: Every morning | ORAL | 0 refills | Status: DC

## 2017-08-23 ENCOUNTER — Other Ambulatory Visit: Payer: Self-pay

## 2017-08-23 ENCOUNTER — Telehealth: Payer: Self-pay

## 2017-08-23 DIAGNOSIS — C61 Malignant neoplasm of prostate: Secondary | ICD-10-CM

## 2017-08-23 MED ORDER — ENZALUTAMIDE 40 MG PO CAPS: ORAL_CAPSULE | ORAL | 0 refills | Status: DC

## 2017-08-23 NOTE — Telephone Encounter (Signed)
Xtandi refill faxed to [facility] at [phone]. Confirmed fax receipt 08/23/17 at 1411.

## 2017-08-24 ENCOUNTER — Telehealth: Payer: Self-pay

## 2017-08-24 NOTE — Telephone Encounter (Signed)
Pt called and left VM wondering about xtandi refill. Called pt back to notify that refill for xtandi was sent to [facility] yesterday. Pt verbalized understanding.

## 2017-09-01 ENCOUNTER — Ambulatory Visit (HOSPITAL_COMMUNITY)
Admission: EM | Admit: 2017-09-01 | Discharge: 2017-09-01 | Disposition: A | Discharge status: Home / Self Care | Payer: Medicare HMO | Attending: Family Medicine | Admitting: Family Medicine

## 2017-09-01 ENCOUNTER — Encounter (HOSPITAL_COMMUNITY): Payer: Self-pay

## 2017-09-01 DIAGNOSIS — R062 Wheezing: Secondary | ICD-10-CM

## 2017-09-01 DIAGNOSIS — J209 Acute bronchitis, unspecified: Secondary | ICD-10-CM | POA: Diagnosis not present

## 2017-09-01 DIAGNOSIS — R05 Cough: Secondary | ICD-10-CM

## 2017-09-01 MED ORDER — ALBUTEROL SULFATE HFA 108 (90 BASE) MCG/ACT IN AERS: 1.0000 | INHALATION_SPRAY | Freq: Four times a day (QID) | RESPIRATORY_TRACT | 0 refills | Status: DC | PRN

## 2017-09-01 MED ORDER — PREDNISONE 50 MG PO TABS: 50.0000 mg | ORAL_TABLET | Freq: Every day | ORAL | 0 refills | Status: AC

## 2017-09-01 MED ORDER — IPRATROPIUM-ALBUTEROL 0.5-2.5 (3) MG/3ML IN SOLN
RESPIRATORY_TRACT | Status: AC
  Filled 2017-09-01: qty 3

## 2017-09-01 MED ORDER — IPRATROPIUM-ALBUTEROL 0.5-2.5 (3) MG/3ML IN SOLN
3.0000 mL | Freq: Once | RESPIRATORY_TRACT | Status: AC
Start: 2017-09-01 — End: 2017-09-01
  Administered 2017-09-01: 3 mL via RESPIRATORY_TRACT

## 2017-09-01 MED ORDER — AZITHROMYCIN 250 MG PO TABS: 250.0000 mg | ORAL_TABLET | Freq: Every day | ORAL | 0 refills | Status: DC

## 2017-09-01 MED ORDER — BENZONATATE 200 MG PO CAPS
200.0000 mg | ORAL_CAPSULE | Freq: Three times a day (TID) | ORAL | 0 refills | Status: AC | PRN
Start: 2017-09-01 — End: 2017-09-08

## 2017-09-01 NOTE — Discharge Instructions (Signed)
I am treating you for a bronchitis We gave you breathing treatment today of albuterol and ipratropium, please use albuterol inhaler at home as needed every 4-6 hours for shortness of breath, wheezing Begin azithromycin-take 2 tablets today, 1 tablet for the following 4 days Prednisone daily with food for the next 5 days Tessalon as needed for cough every 8 hours  Please follow-up if symptoms worsening, not improving or developing worsening shortness of breath, difficulty breathing or chest pain, fevers

## 2017-09-01 NOTE — ED Provider Notes (Signed)
[facility]    CSN: [id number] Arrival date & time: 09/01/17  1212     History   Chief Complaint Chief Complaint  Patient presents with  . URI    HPI Samuel [name] is a 72 y.o. male history of DM type II, hyperlipidemia, blind in left eye presenting today for evaluation of URI symptoms.  Patient states that he has had a productive cough as well as sore throat for the past few weeks.  His symptoms initially began 1 month ago with a sore throat, he was taking aspirin daily which helped relieve his symptoms.  He had hoarseness for 4 to 5 days, and since over the past 3 weeks has had a persistent cough.  Worse at night.  Noting short of breath and wheezing.  He has been taking aspirin, but does not take any cough syrups that he states that this causes him gas.  Denies fevers.  Denies tobacco use, states that he previously smoked approximately 25 to 30 years ago.  HPI  Past Medical History:  Diagnosis Date  . Blind left eye   . Cyst in hand 10/2013   left hand, treated with antibiotic & pain medicine  . Diabetes mellitus without complication ([place])    ?? borderline  being checked  . Hepatitis A    25 YRS AGO  . Prostate cancer ([place])   . Sleep apnea    ????   O2 DESAT  10/26/13  IN ED      Patient Active Problem List   Diagnosis Date Noted  . Type 2 diabetes mellitus with diabetic mononeuropathy, without long-term current use of insulin ([place]) 10/13/2016  . Class 2 obesity with serious comorbidity and body mass index (BMI) of 38.0 to 38.9 in adult 10/13/2016  . Prostate cancer ([place]) 10/13/2016  . Gynecomastia, male 10/13/2016  . Mixed hyperlipidemia 10/13/2016  . 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 EYE SURGERY     LEFT AYE AS CHILD   . JOINT REPLACEMENT     LT KNEE 3 YRS AGO   . 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 TOTAL KNEE ARTHROPLASTY Right 12/04/2013   Procedure: TOTAL KNEE ARTHROPLASTY;   Surgeon: Hessie Dibble, MD;  Location: [facility];  Service: Orthopedics;  Laterality: Right;       Home Medications    Prior to Admission medications   Medication Sig Start Date End Date Taking? Authorizing Provider  albuterol (PROVENTIL HFA;VENTOLIN HFA) 108 (90 Base) MCG/ACT inhaler Inhale 1-2 puffs into the lungs every 6 (six) hours as needed for wheezing or shortness of breath. 09/01/17   Wieters, Hallie C, PA-C  alfuzosin (UROXATRAL) 10 MG 24 hr tablet 1 by mouth daily for bladder 04/04/17   [provider]  aspirin 325 MG tablet Take 325 mg by mouth daily.    [provider]  atorvastatin (LIPITOR) 10 MG tablet Take 1 tablet (10 mg total) by mouth every morning. 08/15/17   [name], DO  azithromycin (ZITHROMAX) 250 MG tablet Take 1 tablet (250 mg total) by mouth daily. Take first 2 tablets together, then 1 every day until finished. 09/01/17   Wieters, Hallie C, PA-C  benzonatate (TESSALON) 200 MG capsule Take 1 capsule (200 mg total) by mouth 3 (three) times daily as needed for up to 7 days for cough. 09/01/17 09/08/17  Wieters, Hallie C, PA-C  enzalutamide (XTANDI) 40 MG capsule Take 4 capsules (  160mg ) by mouth once daily as directed by physician. 08/23/17   Wyatt Portela, MD  predniSONE (DELTASONE) 50 MG tablet Take 1 tablet (50 mg total) by mouth daily for 5 days. 09/01/17 09/06/17  Wieters, Hallie C, PA-C  triptorelin (TRELSTAR) 11.25 MG injection Inject 11.25 mg into the muscle. Every 6 months    [provider]    Family History Family History  Problem Relation Age of Onset  . Stroke Mother   . Heart attack Mother   . Diabetes Mother   . Stroke Father   . Stomach cancer Brother   . Prostate cancer Brother   . Throat cancer Sister     Social History Social History   Tobacco Use  . Smoking status: Former Smoker    Packs/day: 0.50    Years: 5.00    Pack years: 2.50    Types: Cigarettes    Last attempt to quit: 02/16/1983    Years since  quitting: 34.5  . Smokeless tobacco: Never Used  Substance Use Topics  . Alcohol use: No    Alcohol/week: 0.0 oz  . Drug use: No     Allergies   Other and Peach [prunus persica]   Review of Systems Review of Systems  Constitutional: Negative for activity change, appetite change, chills, fatigue and fever.  HENT: Positive for congestion, rhinorrhea and sore throat. Negative for ear pain, sinus pressure and trouble swallowing.   Eyes: Negative for discharge and redness.  Respiratory: Positive for cough, shortness of breath and wheezing. Negative for chest tightness.   Cardiovascular: Negative for chest pain.  Gastrointestinal: Negative for abdominal pain, diarrhea, nausea and vomiting.  Musculoskeletal: Negative for myalgias.  Skin: Negative for rash.  Neurological: Negative for dizziness, light-headedness and headaches.     Physical Exam Triage Vital Signs ED Triage Vitals  Enc Vitals Group     BP 09/01/17 1305 (!) 130/55     Pulse Rate 09/01/17 1305 70     Resp 09/01/17 1305 20     Temp 09/01/17 1305 98.6 F (37 C)     Temp Source 09/01/17 1305 Oral     SpO2 09/01/17 1305 98 %     Weight --      Height --      Head Circumference --      Peak Flow --      Pain Score 09/01/17 1304 0     Pain Loc --      Pain Edu? --      Excl. in [place]? --    No data found.  Updated Vital Signs BP (!) 130/55 (BP Location: Left Arm)   Pulse 70   Temp 98.6 F (37 C) (Oral)   Resp 20   SpO2 98%   Visual Acuity Right Eye Distance:   Left Eye Distance:   Bilateral Distance:    Right Eye Near:   Left Eye Near:    Bilateral Near:     Physical Exam  Constitutional: He is oriented to person, place, and time. He appears well-developed and well-nourished.  HENT:  Head: Normocephalic and atraumatic.  Mouth/Throat: Oropharynx is clear and moist.  Bilateral ears without tenderness to palpation of external auricle, tragus and mastoid, EAC's without erythema or swelling, TM's with  good bony landmarks and cone of light. Non erythematous.  Oral mucosa pink and moist, no tonsillar enlargement or exudate. Posterior pharynx patent and nonerythematous, no uvula deviation or swelling. Normal phonation.  Eyes:  Left eye opaque  Neck:  Neck supple.  Cardiovascular: Normal rate and regular rhythm.  No murmur heard. Pulmonary/Chest: Effort normal. No respiratory distress.  Inspiratory and expiratory wheezing and coarse rhonchi heard throughout bilateral lung fields  Abdominal: Soft. There is no tenderness.  Musculoskeletal: He exhibits no edema.  Neurological: He is alert and oriented to person, place, and time.  Skin: Skin is warm and dry.  Psychiatric: He has a normal mood and affect.  Nursing note and vitals reviewed.    UC Treatments / Results  Labs (all labs ordered are listed, but only abnormal results are displayed) Labs Reviewed - No data to display  EKG None  Radiology No results found.  Procedures Procedures (including critical care time)  Medications Ordered in UC Medications  ipratropium-albuterol (DUONEB) 0.5-2.5 (3) MG/3ML nebulizer solution 3 mL (3 mLs Nebulization Given 09/01/17 1407)    Initial Impression / Assessment and Plan / UC Course  I have reviewed the triage vital signs and the nursing notes.  Pertinent labs & imaging results that were available during my care of the patient were reviewed by me and considered in my medical decision making (see chart for details).     Patient with bilateral coarse sounds, likely with acute bronchitis.  DuoNeb provided in clinic with mild improvement of wheezing, will send home with albuterol inhaler, 5 days of prednisone.  Last A1c was 6.4, DM controlled by diet.  Patient may tolerate the prednisone.  Also provide Tessalon to use as needed for cough.  Azithromycin given length of symptoms being 2 to 3 weeks.Discussed strict return precautions. Patient verbalized understanding and is agreeable with  plan.  Final Clinical Impressions(s) / UC Diagnoses   Final diagnoses:  Acute bronchitis, unspecified organism     Discharge Instructions     I am treating you for a bronchitis We gave you breathing treatment today of albuterol and ipratropium, please use albuterol inhaler at home as needed every 4-6 hours for shortness of breath, wheezing Begin azithromycin-take 2 tablets today, 1 tablet for the following 4 days Prednisone daily with food for the next 5 days Tessalon as needed for cough every 8 hours  Please follow-up if symptoms worsening, not improving or developing worsening shortness of breath, difficulty breathing or chest pain, fevers   ED Prescriptions    Medication Sig Dispense Auth. Provider   albuterol (PROVENTIL HFA;VENTOLIN HFA) 108 (90 Base) MCG/ACT inhaler Inhale 1-2 puffs into the lungs every 6 (six) hours as needed for wheezing or shortness of breath. 1 Inhaler Wieters, Hallie C, PA-C   azithromycin (ZITHROMAX) 250 MG tablet Take 1 tablet (250 mg total) by mouth daily. Take first 2 tablets together, then 1 every day until finished. 6 tablet Wieters, Hallie C, PA-C   benzonatate (TESSALON) 200 MG capsule Take 1 capsule (200 mg total) by mouth 3 (three) times daily as needed for up to 7 days for cough. 28 capsule Wieters, Hallie C, PA-C   predniSONE (DELTASONE) 50 MG tablet Take 1 tablet (50 mg total) by mouth daily for 5 days. 5 tablet Wieters, Hallie C, PA-C     Controlled Substance Prescriptions [place] Controlled Substance Registry consulted? Not Applicable   Janith Lima, [place] 09/01/17 1506

## 2017-09-01 NOTE — ED Triage Notes (Signed)
Pt presents with upper respiratory symptoms

## 2017-09-15 ENCOUNTER — Inpatient Hospital Stay: Payer: Medicare HMO | Attending: Oncology

## 2017-09-15 DIAGNOSIS — Z192 Hormone resistant malignancy status: Secondary | ICD-10-CM | POA: Diagnosis not present

## 2017-09-15 DIAGNOSIS — Z923 Personal history of irradiation: Secondary | ICD-10-CM | POA: Insufficient documentation

## 2017-09-15 DIAGNOSIS — C61 Malignant neoplasm of prostate: Secondary | ICD-10-CM

## 2017-09-15 DIAGNOSIS — Z7982 Long term (current) use of aspirin: Secondary | ICD-10-CM | POA: Diagnosis not present

## 2017-09-15 DIAGNOSIS — Z79899 Other long term (current) drug therapy: Secondary | ICD-10-CM | POA: Diagnosis not present

## 2017-09-15 LAB — CBC WITH DIFFERENTIAL (CANCER CENTER ONLY)
BASOS ABS: 0 10*3/uL (ref 0.0–0.1)
BASOS PCT: 0 %
EOS ABS: 0.1 10*3/uL (ref 0.0–0.5)
EOS PCT: 1 %
HCT: 40.2 % (ref 38.4–49.9)
Hemoglobin: 13.4 g/dL (ref 13.0–17.1)
Lymphocytes Relative: 40 %
Lymphs Abs: 2.8 10*3/uL (ref 0.9–3.3)
MCH: 30.6 pg (ref 27.2–33.4)
MCHC: 33.3 g/dL (ref 32.0–36.0)
MCV: 91.8 fL (ref 79.3–98.0)
MONO ABS: 0.7 10*3/uL (ref 0.1–0.9)
MONOS PCT: 11 %
Neutro Abs: 3.3 10*3/uL (ref 1.5–6.5)
Neutrophils Relative %: 48 %
PLATELETS: 211 10*3/uL (ref 140–400)
RBC: 4.38 MIL/uL (ref 4.20–5.82)
RDW: 14.4 % (ref 11.0–14.6)
WBC Count: 6.9 10*3/uL (ref 4.0–10.3)

## 2017-09-15 LAB — CMP (CANCER CENTER ONLY)
ALT: 53 U/L — ABNORMAL HIGH (ref 0–44)
ANION GAP: 9 (ref 5–15)
AST: 40 U/L (ref 15–41)
Albumin: 3.7 g/dL (ref 3.5–5.0)
Alkaline Phosphatase: 137 U/L — ABNORMAL HIGH (ref 38–126)
BUN: 14 mg/dL (ref 8–23)
CHLORIDE: 106 mmol/L (ref 98–111)
CO2: 24 mmol/L (ref 22–32)
Calcium: 9.4 mg/dL (ref 8.9–10.3)
Creatinine: 0.86 mg/dL (ref 0.61–1.24)
GFR, Estimated: >60 mL/min (ref >60)
Glucose, Bld: 142 mg/dL — ABNORMAL HIGH (ref 70–99)
POTASSIUM: 4.3 mmol/L (ref 3.5–5.1)
SODIUM: 139 mmol/L (ref 135–145)
Total Bilirubin: 0.6 mg/dL (ref 0.3–1.2)
Total Protein: 7.2 g/dL (ref 6.5–8.1)

## 2017-09-16 LAB — PROSTATE-SPECIFIC AG, SERUM (LABCORP): Prostate Specific Ag, Serum: 3.4 ng/mL (ref 0.0–4.0)

## 2017-09-20 ENCOUNTER — Telehealth: Payer: Self-pay | Admitting: *Deleted

## 2017-09-20 ENCOUNTER — Other Ambulatory Visit: Payer: Self-pay | Admitting: *Deleted

## 2017-09-20 DIAGNOSIS — C61 Malignant neoplasm of prostate: Secondary | ICD-10-CM

## 2017-09-20 MED ORDER — ENZALUTAMIDE 40 MG PO CAPS
ORAL_CAPSULE | ORAL | 0 refills | Status: DC
Start: 2017-09-20 — End: 2017-10-19

## 2017-09-20 NOTE — Telephone Encounter (Signed)
Returned patient's phone call regarding refill for [employer] [facility]	[place]. Informed him that I E-scribed it this morning to [facility] [facility]. He verbalized understanding.

## 2017-09-21 DIAGNOSIS — R3912 Poor urinary stream: Secondary | ICD-10-CM | POA: Diagnosis not present

## 2017-09-21 DIAGNOSIS — C61 Malignant neoplasm of prostate: Secondary | ICD-10-CM | POA: Diagnosis not present

## 2017-09-22 ENCOUNTER — Inpatient Hospital Stay (HOSPITAL_BASED_OUTPATIENT_CLINIC_OR_DEPARTMENT_OTHER): Payer: Medicare HMO | Admitting: Oncology

## 2017-09-22 ENCOUNTER — Telehealth: Payer: Self-pay

## 2017-09-22 VITALS — BP 150/71 | HR 84 | Temp 98.7°F | Resp 18 | Ht 69.0 in | Wt 268.3 lb

## 2017-09-22 DIAGNOSIS — Z79899 Other long term (current) drug therapy: Secondary | ICD-10-CM

## 2017-09-22 DIAGNOSIS — Z192 Hormone resistant malignancy status: Secondary | ICD-10-CM

## 2017-09-22 DIAGNOSIS — Z7982 Long term (current) use of aspirin: Secondary | ICD-10-CM | POA: Diagnosis not present

## 2017-09-22 DIAGNOSIS — C61 Malignant neoplasm of prostate: Secondary | ICD-10-CM | POA: Diagnosis not present

## 2017-09-22 DIAGNOSIS — Z923 Personal history of irradiation: Secondary | ICD-10-CM | POA: Diagnosis not present

## 2017-09-22 NOTE — Progress Notes (Signed)
Hematology and Oncology Follow Up Visit  Chipper Koudelka [id number] January 01, 1946 72 y.o. 09/22/2017 11:13 AM [name], [place], [place], [place]   Principle Diagnosis: 72 year old man with castration-resistant prostate cancer documented in 2017 with biochemical relapse only.  He was diagnosed in 2010 with Gleason score 4+4 = 8 and a PSA of 8.97.    Prior Therapy: He was treated with external beam radiation and androgen deprivation.  He developed biochemical relapse with a PSA rise and he has been on combined androgen deprivation with [place] agonist on bicalutamide. PSA in June 2016 was 6.11, in December 2016 was 10, in June 2017 was 9 and in September 2017 was up to 12.  Staging workup including CT scan of the abdomen and pelvis as well as bone scan were personally reviewed and showed no evidence of metastatic disease.  His PSA was up to 22.5 in April 2018.   Current therapy:   Androgen Deprivation given under the care of Dr. Junious Silk [facility].  Xtandi 160 mg daily started in May 2018.  Interim History: Mr. Eble is here for a follow-up visit.  Since last visit, he reports no major changes in his health.  He continues to be active and attends activities of daily living.  He takes [place] without any issues or complications.  He denies any nausea, fatigue or edema.  He denies any urination difficulties.  He denies any bone pain or pathological fractures.  His performance status and quality of life is unchanged.  He does not report any headaches, blurry vision, syncope or seizures.  He denies any alteration mental status or confusion.  He does not report any fevers, chills, sweats or weight loss. He does not report any chest pain, palpitation, orthopnea or leg edema. He does not report any cough, wheezing or hemoptysis. He does not report any nausea, vomiting or change in his bowel habits.  He denies any early satiety.  He does not report any frequency urgency or hesitancy.  He does not report any  skin rashes or lesions.  Denies any bleeding or clotting tendencies.  Does not report arthralgias or myalgias.  Remaining review of systems negative.  Medications: I have reviewed the patient's current medications.  Current Outpatient Medications  Medication Sig Dispense Refill  . albuterol (PROVENTIL HFA;VENTOLIN HFA) 108 (90 Base) MCG/ACT inhaler Inhale 1-2 puffs into the lungs every 6 (six) hours as needed for wheezing or shortness of breath. 1 Inhaler 0  . alfuzosin (UROXATRAL) 10 MG 24 hr tablet 1 by mouth daily for bladder    . aspirin 325 MG tablet Take 325 mg by mouth daily.    Marland Kitchen atorvastatin (LIPITOR) 10 MG tablet Take 1 tablet (10 mg total) by mouth every morning. 90 tablet 0  . azithromycin (ZITHROMAX) 250 MG tablet Take 1 tablet (250 mg total) by mouth daily. Take first 2 tablets together, then 1 every day until finished. 6 tablet 0  . enzalutamide (XTANDI) 40 MG capsule Take 4 capsules (160mg ) by mouth once daily as directed by physician. 120 capsule 0  . triptorelin (TRELSTAR) 11.25 MG injection Inject 11.25 mg into the muscle. Every 6 months     No current facility-administered medications for this visit.      Allergies:  Allergies  Allergen Reactions  . Other Other (See Comments)    Patient stated,"if I touch felt (material) I get "knots" on my body."  . Peach [Prunus Persica] Other (See Comments)    Patient stated,"if I touch or eat peach fuzz,  I get "knots" all over my face and mouth."     Past Medical History, Surgical history, Social history, and Family History without any changes on review.   Physical Exam: Blood pressure (!) 150/71, pulse 84, temperature 98.7 F (37.1 C), temperature source Oral, resp. rate 18, height 5\' 9"  (1.753 m), weight 268 lb 4.8 oz (121.7 kg), SpO2 98 %.   ECOG: 0 General appearance: Alert, awake gentleman without distress. Head: Normocephalic without normalities. Oropharynx: Sclera anicteric. Eyes: Pupils are equal and round  reactive to light. Lymph nodes: No lymphadenopathy noted any cervical, supraclavicular, or axillary lymph nodes. Heart:regular rate and rhythm, S1 and S2 without any murmurs. Lung: Clear without any rhonchi, wheezes or dullness to percussion. Abdomin: Soft, without any rebound or guarding. Musculoskeletal: No joint deformity or effusion. Skin: No ecchymosis or petechiae.   Lab Results: Lab Results  Component Value Date   WBC 6.9 09/15/2017   HGB 13.4 09/15/2017   HCT 40.2 09/15/2017   MCV 91.8 09/15/2017   PLT 211 09/15/2017     Chemistry      Component Value Date/Time   NA 139 09/15/2017 1041   NA 137 12/15/2016 1421   K 4.3 09/15/2017 1041   K 4.4 12/15/2016 1421   CL 106 09/15/2017 1041   CO2 24 09/15/2017 1041   CO2 19 (L) 12/15/2016 1421   BUN 14 09/15/2017 1041   BUN 13.4 12/15/2016 1421   CREATININE 0.86 09/15/2017 1041   CREATININE 0.8 12/15/2016 1421      Component Value Date/Time   CALCIUM 9.4 09/15/2017 1041   CALCIUM 9.4 12/15/2016 1421   ALKPHOS 137 (H) 09/15/2017 1041   ALKPHOS 92 12/15/2016 1421   AST 40 09/15/2017 1041   AST 17 12/15/2016 1421   ALT 53 (H) 09/15/2017 1041   ALT 12 12/15/2016 1421   BILITOT 0.6 09/15/2017 1041   BILITOT 0.37 12/15/2016 1421            Results for YOJAN, PASKETT (MRN [id number]) as of 09/22/2017 11:13  Ref. Range 06/14/2017 12:55 09/15/2017 10:41  Prostate Specific Ag, Serum Latest Ref Range: 0.0 - 4.0 ng/mL 3.0 3.4    Impression and Plan:   72 year old man with  1.  Castration-resistant prostate cancer with biochemical relapse only documented in 2017.    He remains on Xtandi without any complications related to this medication.  His PSA continues to be under excellent control although slowly rising in the last 6 months.  Risks and benefits of continuing this therapy was reviewed today he is agreeable to continue.  The plan is to repeat imaging studies including CT scan and bone scan before the next visit to  complete his staging.  Different salvage therapy may be needed if his PSA starts to rise.    2. Androgen depravation: Continues to receive that under the care of Dr. Junious Silk at [facility].  I recommended continuing this indefinitely.   3. Follow-up: Will be in 3 months .   15  minutes was spent with the patient face-to-face today.  More than 50% of time was dedicated to discussing the natural course of this disease, future treatment options and coordinating his care.  Zola Button, MD 8/8/201911:13 AM

## 2017-09-22 NOTE — Telephone Encounter (Signed)
Printed avs and calender of upcoming appointment. Per 8/8 los. Gave patient contrast, CT information, and instructions.

## 2017-10-05 ENCOUNTER — Encounter: Payer: Self-pay | Admitting: Internal Medicine

## 2017-10-06 ENCOUNTER — Ambulatory Visit: Payer: Self-pay

## 2017-10-14 ENCOUNTER — Other Ambulatory Visit: Payer: Self-pay

## 2017-10-14 ENCOUNTER — Encounter: Payer: Self-pay | Admitting: Internal Medicine

## 2017-10-14 ENCOUNTER — Ambulatory Visit (INDEPENDENT_AMBULATORY_CARE_PROVIDER_SITE_OTHER): Payer: Medicare HMO

## 2017-10-14 ENCOUNTER — Ambulatory Visit (INDEPENDENT_AMBULATORY_CARE_PROVIDER_SITE_OTHER): Payer: Medicare HMO | Admitting: Internal Medicine

## 2017-10-14 VITALS — BP 122/78 | HR 76 | Temp 97.6°F | Ht 69.0 in | Wt 266.0 lb

## 2017-10-14 DIAGNOSIS — K5900 Constipation, unspecified: Secondary | ICD-10-CM

## 2017-10-14 DIAGNOSIS — E1141 Type 2 diabetes mellitus with diabetic mononeuropathy: Secondary | ICD-10-CM

## 2017-10-14 DIAGNOSIS — Z0000 Encounter for general adult medical examination without abnormal findings: Secondary | ICD-10-CM

## 2017-10-14 DIAGNOSIS — Z79899 Other long term (current) drug therapy: Secondary | ICD-10-CM

## 2017-10-14 DIAGNOSIS — Z1159 Encounter for screening for other viral diseases: Secondary | ICD-10-CM

## 2017-10-14 DIAGNOSIS — E782 Mixed hyperlipidemia: Secondary | ICD-10-CM | POA: Diagnosis not present

## 2017-10-14 DIAGNOSIS — Z9189 Other specified personal risk factors, not elsewhere classified: Secondary | ICD-10-CM

## 2017-10-14 DIAGNOSIS — C61 Malignant neoplasm of prostate: Secondary | ICD-10-CM

## 2017-10-14 DIAGNOSIS — N62 Hypertrophy of breast: Secondary | ICD-10-CM

## 2017-10-14 NOTE — Progress Notes (Signed)
Subjective:   Samuel [name] is a 72 y.o. male who presents for Medicare Annual/Subsequent preventive examination.  Last AWV-10/08/2016    Objective:    Vitals: BP 122/78 (BP Location: Left Arm, Patient Position: Sitting)   Pulse 76   Temp 97.6 F (36.4 C)   Ht 5\' 9"  (1.753 m)   Wt 266 lb (120.7 kg)   SpO2 98%   BMI 39.28 kg/m   Body mass index is 39.28 kg/m.  Advanced Directives 10/14/2017 05/11/2017 10/08/2016 06/02/2016 01/30/2016 12/09/2015 09/26/2015  Does Patient Have a Medical Advance Directive? No No No No No No No  Would patient like information on creating a medical advance directive? Yes (MAU/Ambulatory/Procedural Areas - Information given) No - Patient declined Yes (MAU/Ambulatory/Procedural Areas - Information given) - - No - patient declined information No - patient declined information    Tobacco Social History   Tobacco Use  Smoking Status Former Smoker  . Packs/day: 0.50  . Years: 5.00  . Pack years: 2.50  . Types: Cigarettes  . Last attempt to quit: 02/16/1983  . Years since quitting: 34.6  Smokeless Tobacco Never Used     Counseling given: Not Answered   Clinical Intake:  Pre-visit preparation completed: No  Pain : No/denies pain     Nutritional Risks: None Diabetes: Yes CBG done?: No Did pt. bring in CBG monitor from home?: No  How often do you need to have someone help you when you read instructions, pamphlets, or other written materials from your doctor or pharmacy?: 2 - Rarely What is the last grade level you completed in school?: 11th grade  Interpreter Needed?: No  Information entered by :: Tyson Dense, RN  Past Medical History:  Diagnosis Date  . Blind left eye   . Cyst in hand 10/2013   left hand, treated with antibiotic & pain medicine  . Diabetes mellitus without complication ([place])    ?? borderline  being checked  . Hepatitis A    25 YRS AGO  . Prostate cancer ([place])   . Sleep apnea    ????   O2 DESAT  10/26/13  IN ED      Past Surgical History:  Procedure Laterality Date  . EYE SURGERY     LEFT AYE AS CHILD   . JOINT REPLACEMENT     LT KNEE 3 YRS AGO   . 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 TOTAL KNEE ARTHROPLASTY Right 12/04/2013   Procedure: TOTAL KNEE ARTHROPLASTY;  Surgeon: Hessie Dibble, MD;  Location: [facility];  Service: Orthopedics;  Laterality: Right;   Family History  Problem Relation Age of Onset  . Stroke Mother   . Heart attack Mother   . Diabetes Mother   . Stroke Father   . Stomach cancer Brother   . Prostate cancer Brother   . Throat cancer Sister    Social History   Socioeconomic History  . Marital status: Widowed    Spouse name: Not on file  . Number of children: Not on file  . Years of education: Not on file  . Highest education level: Not on file  Occupational History  . Not on file  Social Needs  . Financial resource strain: Not hard at all  . Food insecurity:    Worry: Never true    Inability: Never true  . Transportation needs:    Medical: No    Non-medical: No  Tobacco Use  . Smoking status: Former Smoker  Packs/day: 0.50    Years: 5.00    Pack years: 2.50    Types: Cigarettes    Last attempt to quit: 02/16/1983    Years since quitting: 34.6  . Smokeless tobacco: Never Used  Substance and Sexual Activity  . Alcohol use: No    Alcohol/week: 0.0 standard drinks  . Drug use: No  . Sexual activity: Not on file  Lifestyle  . Physical activity:    Days per week: 3 days    Minutes per session: 10 min  . Stress: Not at all  Relationships  . Social connections:    Talks on phone: More than three times a week    Gets together: More than three times a week    Attends religious service: More than 4 times per year    Active member of club or organization: No    Attends meetings of clubs or organizations: Never    Relationship status: Widowed  Other Topics Concern  . 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 albuterol (PROVENTIL HFA;VENTOLIN HFA) 108 (90 Base) MCG/ACT inhaler Inhale 1-2 puffs into the lungs every 6 (six) hours as needed for wheezing or shortness of breath.  . alfuzosin (UROXATRAL) 10 MG 24 hr tablet 1 by mouth daily for bladder  . aspirin 325 MG tablet Take 325 mg by mouth daily.  Marland Kitchen atorvastatin (LIPITOR) 10 MG tablet Take 1 tablet (10 mg total) by mouth every morning.  . enzalutamide (XTANDI) 40 MG capsule Take 4 capsules (160mg ) by mouth once daily as directed by physician.  . triptorelin (TRELSTAR) 11.25 MG injection Inject 11.25 mg into the muscle. Every 6 months  . azithromycin (ZITHROMAX) 250 MG tablet Take 1 tablet (250 mg total) by mouth daily. Take first 2 tablets together, then 1 every day until finished.   No facility-administered encounter medications on file as of 10/14/2017.     Activities of Daily Living In your present state of health, do you have any difficulty performing the following activities: 10/14/2017  Hearing? N  Vision? Y  Comment L eye blind  Difficulty concentrating or making decisions? N  Walking or climbing stairs? N  Dressing or bathing? N  Doing errands, shopping? N  Preparing Food and eating ? N  Using the Toilet? N  In the past six months, have you accidently leaked urine? Y  Do you have problems with loss of bowel control? N  Managing your Medications? N  Managing your Finances? N   Housekeeping or managing your Housekeeping? N  Some recent data might be hidden    Patient Care Team: [name], DO as PCP - General (Internal Medicine)  Festus Aloe, MD as Consulting Physician (Urology)   Assessment:   This is a routine wellness examination for Samuel [name].  Exercise Activities and Dietary recommendations Current Exercise Habits: Home exercise routine, Type of exercise: walking;strength training/weights, Time (Minutes): 10, Frequency (Times/Week): 3, Weekly Exercise (Minutes/Week): 30, Intensity: Mild, Exercise limited by: None identified  Goals   None     Fall Risk Fall Risk  05/11/2017 10/13/2016 10/08/2016 01/30/2016 09/26/2015  Falls in the past year? No No Yes No No  Number falls in past yr: - - 1 - -  Injury with Fall? - - No - -   Is the patient's home free of loose throw rugs in walkways, pet beds, electrical cords, etc?   yes      Grab bars in the bathroom? no      Handrails on the stairs?   yes      Adequate lighting?   yes  Depression Screen PHQ 2/9 Scores 10/14/2017 05/11/2017 10/13/2016 10/08/2016  PHQ - 2 Score 0 0 0 0    Cognitive Function MMSE - Mini Mental State Exam 10/14/2017 10/08/2016 05/21/2015  Not completed: - - (No Data)  Orientation to time 5 4 5   Orientation to Place 5 5 5   Registration 3 3 3   Attention/ Calculation 5 5 5   Recall 3 1 1   Language- name 2 objects 2 2 2   Language- repeat 1 1 1   Language- follow 3 step command 3 3 3   Language- read & follow direction 1 1 1   Write a sentence 1 1 1   Copy design 1 1 1   Total score 30 27 28          There is no immunization history on file for this patient.  Qualifies for Shingles Vaccine? Yes, edcuated and declined  Screening Tests Health Maintenance  Topic Date Due  . FOOT EXAM  09/25/2016  . URINE MICROALBUMIN  09/25/2016  . HEMOGLOBIN A1C  04/10/2017  . INFLUENZA VACCINE  02/15/2018 (Originally 09/15/2017)  . TETANUS/TDAP  02/15/2018 (Originally 03/26/1964)  . Hepatitis  C Screening  02/15/2018 (Originally 1945-08-17)  . PNA vac Low Risk Adult (1 of 2 - PCV13) 02/15/2018 (Originally 03/25/2009)  . OPHTHALMOLOGY EXAM  05/11/2018  . Fecal DNA (Cologuard)  06/03/2018   Cancer Screenings: Lung: Low Dose CT Chest recommended if Age 28-80 years, 30 pack-year currently smoking OR have quit w/in 15years. Patient does not qualify. Colorectal: up to date  Additional Screenings:  Hepatitis C Screening: declined  Flu vaccine due: declined TDAP due: declined Prevnar due: declined    Plan:    I have personally reviewed and addressed the Medicare Annual Wellness questionnaire and have noted the following in the patient's chart:  A. Medical and social history B. Use of alcohol, tobacco or illicit drugs  C. Current medications and supplements D. Functional ability and status E.  Nutritional status F.  Physical activity G. Advance directives H. List of other physicians I.  Hospitalizations, surgeries, and ER visits in previous 12 months J.  [place] to include hearing, vision, cognitive, depression L. Referrals and appointments - none  In addition, I have reviewed and discussed with patient certain preventive protocols, quality metrics, and best practice recommendations. A written personalized care plan for preventive services as well as general preventive health recommendations were provided to patient.  See attached scanned questionnaire for additional information.   Signed,   Tyson Dense, RN Nurse Health Advisor  Patient Concerns: Bowels are hard and small

## 2017-10-14 NOTE — Progress Notes (Signed)
Patient ID: Samuel [name], male   DOB: April 17, 1945, 72 y.o.   MRN: [id number]   Location:  [facility]  Place of Service:  OFFICE  Provider: Arletha Grippe, DO  Patient Care Team: [name], DO as PCP - General (Internal Medicine) Festus Aloe, MD as Consulting Physician (Urology)  Extended Emergency Contact Information Primary Emergency Contact: [facility] Address: [place], [place] [address] [place] Phone: [phone] Mobile Phone: [phone] Relation: Significant other Secondary Emergency Contact: Saunders Glance [place] Mobile Phone: [phone] Relation: Daughter  Code Status: Goals of Care: Advanced Directive information Advanced Directives 10/14/2017  Does Patient Have a Medical Advance Directive? No  Would patient like information on creating a medical advance directive? Yes (MAU/Ambulatory/Procedural Areas - Information given)     Chief Complaint  Patient presents with  . Annual Exam    CPE    HPI: Patient is a 72 y.o. male seen in today for a comprehensive exam. AWV completed today. MMSE 30/30. He is c/a constipation - stools are hard. He has a BM 2 times oer day but states they "hard balls". He drinks plenty of liquids, including water. BMs became a problem 3 weeks ago after tx for bronchitis with HFA, abx and tessalon perles.  Prostate cancer, recurrent - he has increased urinary frequency since flomax doubled last several weeks. He is currently on Xtandi (May 2018) due to elevated PSA (up to 22.5 in April 2018). PSA 3.4 (prev 3.0). Followed by H/O Dr Alen Blew. He was 1st dx in 2003 and was tx with external beam XRT. It recurred in 2009 and has been getting hormone injections q2mos currently. He no longer takes bicalutamide 50mg  daily for suppression. He has nocturia 3-4 times per night. 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 A1c 6.5%.Takes ASA daily. He does not check BS at home. Urine microalbumin/cr ratio 4.0. He is on statin tx. He has experienced some numbness/tingling in his feet that improved with increased water intake.  Hyperlipidemia - at goal on lipitor. LDL 88; HDL 77; Exercises 3-4 times per week (walking, squats, sit ups).   Hx Knee DJD - s/p b/l TKR. Followed by Ortho Dr Rhona Raider at [facility]. No issues  Last colonoscopy in 2015 in [place] per pt. cologuard in April 2017 neg  Depression screen [facility] 2/9 10/14/2017 05/11/2017 10/13/2016 10/08/2016 05/21/2015  Decreased Interest 0 0 0 0 0  Down, Depressed, Hopeless 0 0 0 0 0  PHQ - 2 Score 0 0 0 0 0    Fall Risk  05/11/2017 10/13/2016 10/08/2016 01/30/2016 09/26/2015  Falls in the past year? No No Yes No No  Number falls in past yr: - - 1 - -  Injury with Fall? - - No - -   MMSE - Mini Mental State Exam 10/14/2017 10/08/2016 05/21/2015  Not completed: - - (No Data)  Orientation to time 5 4 5   Orientation to Place 5 5 5   Registration 3 3 3   Attention/ Calculation 5 5 5   Recall 3 1 1   Language- name 2 objects 2 2 2   Language- repeat 1 1 1   Language- follow 3 step command 3 3 3   Language- read & follow direction 1 1 1   Write a sentence 1 1 1   Copy design 1 1 1   Total  score 30 27 28      Health Maintenance  Topic Date Due  . FOOT EXAM  09/25/2016  . URINE MICROALBUMIN  09/25/2016  . HEMOGLOBIN A1C  04/10/2017  . INFLUENZA VACCINE  02/15/2018 (Originally 09/15/2017)  . TETANUS/TDAP  02/15/2018 (Originally 03[birth year]26/1964)  . Hepatitis C Screening  02/15/2018 (Originally Mar 18, 1945)  . PNA vac Low Risk Adult (1 of 2 - PCV13) 02/15/2018 (Originally 03/25/2009)  . OPHTHALMOLOGY EXAM  05/11/2018  . Fecal DNA (Cologuard)  06/03/2018    Past Medical History:  Diagnosis Date  . Blind left eye   . Cyst in hand 10/2013   left hand, treated with antibiotic & pain medicine  . Diabetes mellitus without complication ([place])    ?? borderline  being checked  .  Hepatitis A    25 YRS AGO  . Prostate cancer ([place])   . Sleep apnea    ????   O2 DESAT  10/26/13  IN ED      Past Surgical History:  Procedure Laterality Date  . EYE SURGERY     LEFT AYE AS CHILD   . JOINT REPLACEMENT     LT KNEE 3 YRS AGO   . 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 TOTAL KNEE ARTHROPLASTY Right 12/04/2013   Procedure: TOTAL KNEE ARTHROPLASTY;  Surgeon: Hessie Dibble, MD;  Location: [place];  Service: Orthopedics;  Laterality: Right;    Family History  Problem Relation Age of Onset  . Stroke Mother   . Heart attack Mother   . Diabetes Mother   . Stroke Father   . Stomach cancer Brother   . Prostate cancer Brother   . Throat cancer Sister    Family Status  Relation Name Status  . Mother Calhoun Reichardt Deceased at age 49  . Sister Reberda Deceased  . Daughter [employer]  . Father Luisdaniel Kenton Deceased at age 9  . Brother [employer]  . Sister Derry Skill Deceased  . Brother Jimmy Deceased  . Brother Iona Beard Deceased  . Sister Rod Holler Deceased  . Brother [employer]  . Sister [employer]  . Sister Leo Grosser  . Brother [employer]  . Daughter Primus Bravo    Social History   Socioeconomic History  . Marital status: Widowed    Spouse name: Not on file  . Number of children: Not on file  . Years of education: Not on file  . Highest education level: Not on file  Occupational History  . Not on file  Social Needs  . Financial resource strain: Not hard at all  . Food insecurity:    Worry: Never true    Inability: Never true  . Transportation needs:    Medical: No    Non-medical: No  Tobacco Use  . Smoking status: Former Smoker    Packs/day: 0.50    Years: 5.00    Pack years: 2.50    Types: Cigarettes    Last attempt to quit: 02/16/1983    Years since quitting: 34.6  . Smokeless tobacco: Never Used  Substance and Sexual Activity  . Alcohol use: No    Alcohol/week: 0.0 standard drinks  . Drug use: No  . Sexual  activity: Not on file  Lifestyle  . Physical activity:    Days per week: 3 days    Minutes per session: 10 min  . Stress: Not at all  Relationships  . Social connections:    Talks on phone: More than three times  a week    Gets together: More than three times a week    Attends religious service: More than 4 times per year    Active member of club or organization: No    Attends meetings of clubs or organizations: Never    Relationship status: Widowed  . Intimate partner violence:    Fear of current or ex partner: No    Emotionally abused: No    Physically abused: No    Forced sexual activity: No  Other Topics Concern  . 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 Other Other (See Comments)    Patient stated,"if I touch felt (material) I get "knots" on my body."  . Peach [Prunus Persica] Other (See Comments)    Patient stated,"if I touch or eat peach fuzz, I get "knots" all over my face and mouth."     Allergies as of 10/14/2017      Reactions   Other Other (See Comments)   Patient stated,"if I touch felt (material) I get "knots" on my body."   Peach [prunus Persica] Other (See Comments)   Patient stated,"if I touch or eat peach fuzz, I get "knots" all over my face and mouth."       Medication List        Accurate as of 10/14/17  9:59 AM. Always use your most recent med list.          albuterol 108 (90 Base) MCG/ACT inhaler Commonly  known as:  PROVENTIL HFA;VENTOLIN HFA Inhale 1-2 puffs into the lungs every 6 (six) hours as needed for wheezing or shortness of breath.   alfuzosin 10 MG 24 hr tablet Commonly known as:  UROXATRAL 1 by mouth daily for bladder   aspirin 325 MG tablet Take 325 mg by mouth daily.   atorvastatin 10 MG tablet Commonly known as:  LIPITOR Take 1 tablet (10 mg total) by mouth every morning.   enzalutamide 40 MG capsule Commonly known as:  XTANDI Take 4 capsules (160mg ) by mouth once daily as directed by physician.   TRELSTAR 11.25 MG injection Generic drug:  triptorelin Inject 11.25 mg into the muscle. Every 6 months        Review of Systems:  Review of Systems  Respiratory: Positive for cough (intermittent).   Gastrointestinal: Positive for constipation.  All other systems reviewed and are negative.   Physical Exam: Vitals:   10/14/17 0946  BP: 122/78  Pulse: 76  Temp: 97.6 F (36.4 C)  TempSrc: Oral  SpO2: 98%  Weight: 266 lb (120.7 kg)  Height: 5\' 9"  (1.753 m)   Body mass index is 39.28 kg/m. Physical Exam  Constitutional: He is oriented to  person, place, and time. He appears well-developed and well-nourished. No distress.  HENT:  Head: Normocephalic and atraumatic.  Right Ear: Hearing, tympanic membrane, external ear and ear canal normal.  Left Ear: Hearing, tympanic membrane, external ear and ear canal normal.  Mouth/Throat: Uvula is midline, oropharynx is clear and moist and mucous membranes are normal. He does not have dentures.  Eyes: Pupils are equal, round, and reactive to light. Conjunctivae and lids are normal. No scleral icterus.  OS corneal clouding; left EOM does not track; right EOMI  Neck: Trachea normal and normal range of motion. Neck supple. Carotid bruit is not present. No thyroid mass and no thyromegaly present.  Cardiovascular: Normal rate, regular rhythm and intact distal pulses. Exam reveals no gallop and no friction rub.  Murmur (1/6 SEM)  heard. No LE edema b/l. No calf TTP.   Pulmonary/Chest: Effort normal and breath sounds normal. No stridor. No respiratory distress. He has no wheezes. He has no rhonchi. He has no rales. He exhibits no mass and no tenderness. Right breast exhibits no inverted nipple, no mass, no nipple discharge, no skin change and no tenderness. Left breast exhibits no inverted nipple, no mass, no nipple discharge, no skin change and no tenderness. Breasts are symmetrical.    Abdominal: Soft. Normal appearance, normal aorta and bowel sounds are normal. He exhibits distension. He exhibits no pulsatile midline mass and no mass. There is no hepatosplenomegaly. There is no tenderness. There is no rigidity, no rebound and no guarding. No hernia.  obese  Musculoskeletal: Normal range of motion. He exhibits edema (small and large joints). He exhibits no deformity.  Lymphadenopathy:       Head (right side): No posterior auricular adenopathy present.       Head (left side): No posterior auricular adenopathy present.    He has no cervical adenopathy.       Right: No supraclavicular adenopathy present.       Left: No supraclavicular adenopathy present.  Neurological: He is alert and oriented to person, place, and time. He has normal strength and normal reflexes. No cranial nerve deficit. Gait normal.  Skin: Skin is warm, dry and intact. No rash noted. Nails show no clubbing.  Psychiatric: He has a normal mood and affect. His speech is normal and behavior is normal. Judgment and thought content normal. Cognition and memory are normal.    Labs reviewed:  Basic Metabolic Panel: Recent Labs    03/17/17 1218 06/14/17 1255 09/15/17 1041  NA 138 139 139  K 4.0 4.0 4.3  CL 108 110* 106  CO2 20* 21* 24  GLUCOSE 171* 117 142*  BUN 18 19 14   CREATININE 0.98 0.94 0.86  CALCIUM 9.1 9.9 9.4   Liver Function Tests: Recent Labs    03/17/17 1218 06/14/17 1255 09/15/17 1041  AST 14 29 40  ALT 11 46 53*  ALKPHOS 108  115 137*  BILITOT 0.4 0.4 0.6  PROT 7.4 7.5 7.2  ALBUMIN 3.6 4.0 3.7   No results for input(s): LIPASE, AMYLASE in the last 8760 hours. No results for input(s): AMMONIA in the last 8760 hours. CBC: Recent Labs    03/17/17 1218 06/14/17 1255 09/15/17 1041  WBC 6.8 6.6 6.9  NEUTROABS 3.8 3.1 3.3  HGB 13.4 13.6 13.4  HCT 40.9 40.9 40.2  MCV 91.2 91.3 91.8  PLT 226 225 211   Lipid Panel: No results for input(s): CHOL, HDL, LDLCALC, TRIG, CHOLHDL, LDLDIRECT in the last 8760 hours. Lab Results  Component Value Date   HGBA1C 6.5 (H) 10/08/2016    Procedures: No results found. ECG OBTAINED AND REVIEWED BY MYSELF: NSR @ 66 bpm, LAD, LAE, poor R wave progression. No acute ischemic changes. No change since 05/2015   Assessment/Plan   ICD-10-CM   1. Well adult exam Z00.00   2. Constipation, unspecified constipation type K59.00   3. Mixed hyperlipidemia E78.2 Lipid Panel    TSH  4. Type 2 diabetes mellitus with diabetic mononeuropathy, without long-term current use of insulin (HCC) E11.41 Hemoglobin A1c    Microalbumin/Creatinine Ratio, Urine  5. Gynecomastia, male N25   6. Prostate cancer ([place]) C61   7. High risk medication use Z79.899 EKG 12-Lead   START LINZESS 72 MCG 1 DAILY FOR CONSTIPATION (samples given). If no response, try miralax OTC daily (samples given) + stool softener (like colace) daily  Continue other medications as ordered  Will call with lab results  Follow up with Dr Alen Blew and Dr Junious Silk as scheduled  Follow up in 6 mos for DM, hyperlipidemia, prostate cancer, constipation  Teshaun Olarte S. Perlie Gold  [facility] and [facility] [address] [place], [place] [address] [phone] Cell (Monday-Friday 8 AM - 5 PM) [phone] After 5 PM and follow prompts

## 2017-10-14 NOTE — Patient Instructions (Addendum)
START LINZESS 72 MCG 1 DAILY FOR CONSTIPATION (samples given). If no response, try miralax OTC daily (samples given) + stool softener (like colace) daily  Continue other medications as ordered  Will call with lab results  Follow up with Dr Alen Blew and Dr Junious Silk as scheduled  Follow up in 6 mos for DM, hyperlipidemia, prostate cancer, constipation   Constipation, Adult Constipation is when a person:  Poops (has a bowel movement) fewer times in a week than normal.  Has a hard time pooping.  Has poop that is dry, hard, or bigger than normal.  Follow these instructions at home: Eating and drinking   Eat foods that have a lot of fiber, such as: ? Fresh fruits and vegetables. ? Whole grains. ? Beans.  Eat less of foods that are high in fat, low in fiber, or overly processed, such as: ? [place] fries. ? Hamburgers. ? Cookies. ? Candy. ? Soda.  Drink enough fluid to keep your pee (urine) clear or pale yellow. General instructions  Exercise regularly or as told by your doctor.  Go to the restroom when you feel like you need to poop. Do not hold it in.  Take over-the-counter and prescription medicines only as told by your doctor. These include any fiber supplements.  Do pelvic floor retraining exercises, such as: ? Doing deep breathing while relaxing your lower belly (abdomen). ? Relaxing your pelvic floor while pooping.  Watch your condition for any changes.  Keep all follow-up visits as told by your doctor. This is important. Contact a doctor if:  You have pain that gets worse.  You have a fever.  You have not pooped for 4 days.  You throw up (vomit).  You are not hungry.  You lose weight.  You are bleeding from the anus.  You have thin, pencil-like poop (stool). Get help right away if:  You have a fever, and your symptoms suddenly get worse.  You leak poop or have blood in your poop.  Your belly feels hard or bigger than normal (is bloated).  You  have very bad belly pain.  You feel dizzy or you faint. This information is not intended to replace advice given to you by your health care provider. Make sure you discuss any questions you have with your health care provider. Document Released: 07/21/2007 Document Revised: 08/22/2015 Document Reviewed: 07/23/2015 Elsevier Interactive Patient Education  2018 [place] you healthy  Get these tests  Blood pressure- Have your blood pressure checked once a year by your healthcare provider.  Normal blood pressure is 120/80  Weight- Have your body mass index (BMI) calculated to screen for obesity.  BMI is a measure of body fat based on height and weight. You can also calculate your own BMI at [email or web address].  Cholesterol- Have your cholesterol checked every year.  Diabetes- Have your blood sugar checked regularly if you have high blood pressure, high cholesterol, have a family history of diabetes or if you are overweight.  Screening for Colon Cancer- Colonoscopy starting at age 81.  Screening may begin sooner depending on your family history and other health conditions. Follow up colonoscopy as directed by your Gastroenterologist.  Screening for Prostate Cancer- Both blood work (PSA) and a rectal exam help screen for Prostate Cancer.  Screening begins at age 61 with African-[name] men and at age 52 with Caucasian men.  Screening may begin sooner depending on your family history.  Take these medicines  Aspirin- One aspirin daily can help prevent  Heart disease and Stroke.  Flu shot- Every fall.  Tetanus- Every 10 years.  Zostavax- Once after the age of 53 to prevent Shingles.  Pneumonia shot- Once after the age of 75; if you are younger than 76, ask your healthcare provider if you need a Pneumonia shot.  Take these steps  Don't smoke- If you do smoke, talk to your doctor about quitting.  For tips on how to quit, go to www.smokefree.gov or call  1-800-QUIT-NOW.  Be physically active- Exercise 5 days a week for at least 30 minutes.  If you are not already physically active start slow and gradually work up to 30 minutes of moderate physical activity.  Examples of moderate activity include walking briskly, mowing the yard, dancing, swimming, bicycling, etc.  Eat a healthy diet- Eat a variety of healthy food such as fruits, vegetables, low fat milk, low fat cheese, yogurt, lean meant, poultry, fish, beans, tofu, etc. For more information go to www.thenutritionsource.org  Drink alcohol in moderation- Limit alcohol intake to less than two drinks a day. Never drink and drive.  Dentist- Brush and floss twice daily; visit your dentist twice a year.  Depression- Your emotional health is as important as your physical health. If you're feeling down, or losing interest in things you would normally enjoy please talk to your healthcare provider.  Eye exam- Visit your eye doctor every year.  Safe sex- If you may be exposed to a sexually transmitted infection, use a condom.  Seat belts- Seat belts can save your life; always wear one.  Smoke/Carbon Monoxide detectors- These detectors need to be installed on the appropriate level of your home.  Replace batteries at least once a year.  Skin cancer- When out in the sun, cover up and use sunscreen 15 SPF or higher.  Violence- If anyone is threatening you, please tell your healthcare provider.  Living Will/ Health care power of attorney- Speak with your healthcare provider and family.  Linaclotide oral capsules What is this medicine? LINACLOTIDE (lin a KLOE tide) is used to treat irritable bowel syndrome (IBS) with constipation as the main problem. It may also be used for relief of chronic constipation. This medicine may be used for other purposes; ask your health care provider or pharmacist if you have questions. COMMON BRAND NAME(S): Linzess What should I tell my health care provider before I take  this medicine? They need to know if you have any of these conditions: -history of stool (fecal) impaction -now have diarrhea or have diarrhea often -other medical condition -stomach or intestinal disease, including bowel obstruction or abdominal adhesions -an unusual or allergic reaction to linaclotide, other medicines, foods, dyes, or preservatives -pregnant or trying to get pregnant -breast-feeding How should I use this medicine? Take this medicine by mouth with a glass of water. Follow the directions on the prescription label. Do not cut, crush or chew this medicine. Take on an empty stomach, at least 30 minutes before your first meal of the day. Take your medicine at regular intervals. Do not take your medicine more often than directed. Do not stop taking except on your doctor's advice. A special MedGuide will be given to you by the pharmacist with each prescription and refill. Be sure to read this information carefully each time. Talk to your pediatrician regarding the use of this medicine in children. This medicine is not approved for use in children. Overdosage: If you think you have taken too much of this medicine contact a poison control center or  emergency room at once. NOTE: This medicine is only for you. Do not share this medicine with others. What if I miss a dose? If you miss a dose, just skip that dose. Wait until your next dose, and take only that dose. Do not take double or extra doses. What may interact with this medicine? -certain medicines for bowel problems or bladder incontinence (these can cause constipation) This list may not describe all possible interactions. Give your health care provider a list of all the medicines, herbs, non-prescription drugs, or dietary supplements you use. Also tell them if you smoke, drink alcohol, or use illegal drugs. Some items may interact with your medicine. What should I watch for while using this medicine? Visit your doctor for regular  check ups. Tell your doctor if your symptoms do not get better or if they get worse. Diarrhea is a common side effect of this medicine. It often begins within 2 weeks of starting this medicine. Stop taking this medicine and call your doctor if you get severe diarrhea. Stop taking this medicine and call your doctor or go to the nearest hospital emergency room right away if you develop unusual or severe stomach-area (abdominal) pain, especially if you also have bright red, bloody stools or black stools that look like tar. What side effects may I notice from receiving this medicine? Side effects that you should report to your doctor or health care professional as soon as possible: -allergic reactions like skin rash, itching or hives, swelling of the face, lips, or tongue -black, tarry stools -bloody or watery diarrhea -new or worsening stomach pain -severe or prolonged diarrhea Side effects that usually do not require medical attention (report to your doctor or health care professional if they continue or are bothersome): -bloating -gas -loose stools This list may not describe all possible side effects. Call your doctor for medical advice about side effects. You may report side effects to FDA at 1-800-FDA-1088. Where should I keep my medicine? Keep out of the reach of children. Store at room temperature between 20 and 25 degrees C (68 and 77 degrees F). Keep this medicine in the original container. Keep tightly closed in a dry place. Do not remove the desiccant packet from the bottle, it helps to protect your medicine from moisture. Throw away any unused medicine after the expiration date. NOTE: This sheet is a summary. It may not cover all possible information. If you have questions about this medicine, talk to your doctor, pharmacist, or health care provider.  2018 Elsevier/Gold Standard (2015-03-06 12:17:04)

## 2017-10-14 NOTE — Patient Instructions (Addendum)
Samuel [name] , Thank you for taking time to come for your Medicare Wellness Visit. I appreciate your ongoing commitment to your health goals. Please review the following plan we discussed and let me know if I can assist you in the future.   Screening recommendations/referrals: Colonoscopy up to date. Cologuard due 05/2018 Recommended yearly ophthalmology/optometry visit for glaucoma screening and checkup Recommended yearly dental visit for hygiene and checkup  Vaccinations: Influenza vaccine due, declined Pneumococcal vaccine due, delcined Tdap vaccine due, declined Shingles vaccine due, declined    Advanced directives: Advance directive discussed with you today. I have provided a copy for you to complete at home and have notarized. Once this is complete please bring a copy in to our office so we can scan it into your chart.  Conditions/risks identified: none  Next appointment: Tyson Dense, RN 10/16/2018 @ 11:30am  Preventive Care 73 Years and Older, Male Preventive care refers to lifestyle choices and visits with your health care provider that can promote health and wellness. What does preventive care include?  A yearly physical exam. This is also called an annual well check.  Dental exams once or twice a year.  Routine eye exams. Ask your health care provider how often you should have your eyes checked.  Personal lifestyle choices, including:  Daily care of your teeth and gums.  Regular physical activity.  Eating a healthy diet.  Avoiding tobacco and drug use.  Limiting alcohol use.  Practicing safe sex.  Taking low doses of aspirin every day.  Taking vitamin and mineral supplements as recommended by your health care provider. What happens during an annual well check? The services and screenings done by your health care provider during your annual well check will depend on your age, overall health, lifestyle risk factors, and family history of disease. Counseling  Your  health care provider may ask you questions about your:  Alcohol use.  Tobacco use.  Drug use.  Emotional well-being.  Home and relationship well-being.  Sexual activity.  Eating habits.  History of falls.  Memory and ability to understand (cognition).  Work and work Statistician. Screening  You may have the following tests or measurements:  Height, weight, and BMI.  Blood pressure.  Lipid and cholesterol levels. These may be checked every 5 years, or more frequently if you are over 50 years old.  Skin check.  Lung cancer screening. You may have this screening every year starting at age 55 if you have a 30-pack-year history of smoking and currently smoke or have quit within the past 15 years.  Fecal occult blood test (FOBT) of the stool. You may have this test every year starting at age 78.  Flexible sigmoidoscopy or colonoscopy. You may have a sigmoidoscopy every 5 years or a colonoscopy every 10 years starting at age 36.  Prostate cancer screening. Recommendations will vary depending on your family history and other risks.  Hepatitis C blood test.  Hepatitis B blood test.  Sexually transmitted disease (STD) testing.  Diabetes screening. This is done by checking your blood sugar (glucose) after you have not eaten for a while (fasting). You may have this done every 1-3 years.  Abdominal aortic aneurysm (AAA) screening. You may need this if you are a current or former smoker.  Osteoporosis. You may be screened starting at age 67 if you are at high risk. Talk with your health care provider about your test results, treatment options, and if necessary, the need for more tests. Vaccines  Your  health care provider may recommend certain vaccines, such as:  Influenza vaccine. This is recommended every year.  Tetanus, diphtheria, and acellular pertussis (Tdap, Td) vaccine. You may need a Td booster every 10 years.  Zoster vaccine. You may need this after age  9.  Pneumococcal 13-valent conjugate (PCV13) vaccine. One dose is recommended after age 54.  Pneumococcal polysaccharide (PPSV23) vaccine. One dose is recommended after age 68. Talk to your health care provider about which screenings and vaccines you need and how often you need them. This information is not intended to replace advice given to you by your health care provider. Make sure you discuss any questions you have with your health care provider. Document Released: 02/28/2015 Document Revised: 10/22/2015 Document Reviewed: 12/03/2014 Elsevier Interactive Patient Education  2017 [place] Prevention in the Home Falls can cause injuries. They can happen to people of all ages. There are many things you can do to make your home safe and to help prevent falls. What can I do on the outside of my home?  Regularly fix the edges of walkways and driveways and fix any cracks.  Remove anything that might make you trip as you walk through a door, such as a raised step or threshold.  Trim any bushes or trees on the path to your home.  Use bright outdoor lighting.  Clear any walking paths of anything that might make someone trip, such as rocks or tools.  Regularly check to see if handrails are loose or broken. Make sure that both sides of any steps have handrails.  Any raised decks and porches should have guardrails on the edges.  Have any leaves, snow, or ice cleared regularly.  Use sand or salt on walking paths during winter.  Clean up any spills in your garage right away. This includes oil or grease spills. What can I do in the bathroom?  Use night lights.  Install grab bars by the toilet and in the tub and shower. Do not use towel bars as grab bars.  Use non-skid mats or decals in the tub or shower.  If you need to sit down in the shower, use a plastic, non-slip stool.  Keep the floor dry. Clean up any water that spills on the floor as soon as it happens.  Remove  soap buildup in the tub or shower regularly.  Attach bath mats securely with double-sided non-slip rug tape.  Do not have throw rugs and other things on the floor that can make you trip. What can I do in the bedroom?  Use night lights.  Make sure that you have a light by your bed that is easy to reach.  Do not use any sheets or blankets that are too big for your bed. They should not hang down onto the floor.  Have a firm chair that has side arms. You can use this for support while you get dressed.  Do not have throw rugs and other things on the floor that can make you trip. What can I do in the kitchen?  Clean up any spills right away.  Avoid walking on wet floors.  Keep items that you use a lot in easy-to-reach places.  If you need to reach something above you, use a strong step stool that has a grab bar.  Keep electrical cords out of the way.  Do not use floor polish or wax that makes floors slippery. If you must use wax, use non-skid floor wax.  Do not  have throw rugs and other things on the floor that can make you trip. What can I do with my stairs?  Do not leave any items on the stairs.  Make sure that there are handrails on both sides of the stairs and use them. Fix handrails that are broken or loose. Make sure that handrails are as long as the stairways.  Check any carpeting to make sure that it is firmly attached to the stairs. Fix any carpet that is loose or worn.  Avoid having throw rugs at the top or bottom of the stairs. If you do have throw rugs, attach them to the floor with carpet tape.  Make sure that you have a light switch at the top of the stairs and the bottom of the stairs. If you do not have them, ask someone to add them for you. What else can I do to help prevent falls?  Wear shoes that:  Do not have high heels.  Have rubber bottoms.  Are comfortable and fit you well.  Are closed at the toe. Do not wear sandals.  If you use a  stepladder:  Make sure that it is fully opened. Do not climb a closed stepladder.  Make sure that both sides of the stepladder are locked into place.  Ask someone to hold it for you, if possible.  Clearly mark and make sure that you can see:  Any grab bars or handrails.  First and last steps.  Where the edge of each step is.  Use tools that help you move around (mobility aids) if they are needed. These include:  Canes.  Walkers.  Scooters.  Crutches.  Turn on the lights when you go into a dark area. Replace any light bulbs as soon as they burn out.  Set up your furniture so you have a clear path. Avoid moving your furniture around.  If any of your floors are uneven, fix them.  If there are any pets around you, be aware of where they are.  Review your medicines with your doctor. Some medicines can make you feel dizzy. This can increase your chance of falling. Ask your doctor what other things that you can do to help prevent falls. This information is not intended to replace advice given to you by your health care provider. Make sure you discuss any questions you have with your health care provider. Document Released: 11/28/2008 Document Revised: 07/10/2015 Document Reviewed: 03/08/2014 Elsevier Interactive Patient Education  2017 [name].

## 2017-10-15 LAB — LIPID PANEL
Cholesterol: 209 mg/dL — ABNORMAL HIGH (ref <200)
HDL: 82 mg/dL (ref >40)
LDL CHOLESTEROL (CALC): 108 mg/dL — AB
Non-HDL Cholesterol (Calc): 127 mg/dL (calc) (ref <130)
TRIGLYCERIDES: 92 mg/dL (ref <150)
Total CHOL/HDL Ratio: 2.5 (calc) (ref <5.0)

## 2017-10-15 LAB — HEMOGLOBIN A1C
Hgb A1c MFr Bld: 7 % of total Hgb — ABNORMAL HIGH (ref <5.7)
MEAN PLASMA GLUCOSE: 154 (calc)
eAG (mmol/L): 8.5 (calc)

## 2017-10-15 LAB — TSH: TSH: 1.66 mIU/L (ref 0.40–4.50)

## 2017-10-15 LAB — MICROALBUMIN / CREATININE URINE RATIO
Creatinine, Urine: 120 mg/dL (ref 20–320)
MICROALB UR: 0.5 mg/dL
MICROALB/CREAT RATIO: 4 ug/mg{creat} (ref <30)

## 2017-10-19 ENCOUNTER — Other Ambulatory Visit: Payer: Self-pay | Admitting: *Deleted

## 2017-10-19 DIAGNOSIS — C61 Malignant neoplasm of prostate: Secondary | ICD-10-CM

## 2017-10-19 MED ORDER — ENZALUTAMIDE 40 MG PO CAPS
ORAL_CAPSULE | ORAL | 0 refills | Status: DC
Start: 2017-10-19 — End: 2017-11-21

## 2017-11-02 ENCOUNTER — Telehealth: Payer: Self-pay

## 2017-11-02 DIAGNOSIS — E1141 Type 2 diabetes mellitus with diabetic mononeuropathy: Secondary | ICD-10-CM

## 2017-11-02 MED ORDER — METFORMIN HCL 500 MG PO TABS: 250.0000 mg | ORAL_TABLET | Freq: Every day | ORAL | 4 refills | Status: DC

## 2017-11-02 NOTE — Telephone Encounter (Signed)
Spoke with patient, patient aware RX sent to pharmacy

## 2017-11-02 NOTE — Telephone Encounter (Signed)
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

## 2017-11-04 DIAGNOSIS — Z5111 Encounter for antineoplastic chemotherapy: Secondary | ICD-10-CM | POA: Diagnosis not present

## 2017-11-04 DIAGNOSIS — C61 Malignant neoplasm of prostate: Secondary | ICD-10-CM | POA: Diagnosis not present

## 2017-11-07 ENCOUNTER — Encounter (HOSPITAL_COMMUNITY): Payer: Self-pay | Admitting: *Deleted

## 2017-11-07 ENCOUNTER — Emergency Department (HOSPITAL_COMMUNITY): Payer: Medicare HMO

## 2017-11-07 ENCOUNTER — Other Ambulatory Visit: Payer: Self-pay

## 2017-11-07 ENCOUNTER — Emergency Department (HOSPITAL_COMMUNITY)
Admission: EM | Admit: 2017-11-07 | Discharge: 2017-11-07 | Disposition: A | Discharge status: Home / Self Care | Payer: Medicare HMO | Attending: Emergency Medicine | Admitting: Emergency Medicine

## 2017-11-07 DIAGNOSIS — R55 Syncope and collapse: Secondary | ICD-10-CM | POA: Diagnosis not present

## 2017-11-07 DIAGNOSIS — E860 Dehydration: Secondary | ICD-10-CM | POA: Insufficient documentation

## 2017-11-07 DIAGNOSIS — R197 Diarrhea, unspecified: Secondary | ICD-10-CM | POA: Insufficient documentation

## 2017-11-07 DIAGNOSIS — Z87891 Personal history of nicotine dependence: Secondary | ICD-10-CM | POA: Diagnosis not present

## 2017-11-07 DIAGNOSIS — R531 Weakness: Secondary | ICD-10-CM | POA: Diagnosis not present

## 2017-11-07 DIAGNOSIS — Z7984 Long term (current) use of oral hypoglycemic drugs: Secondary | ICD-10-CM | POA: Insufficient documentation

## 2017-11-07 DIAGNOSIS — Z79899 Other long term (current) drug therapy: Secondary | ICD-10-CM | POA: Diagnosis not present

## 2017-11-07 DIAGNOSIS — Z96651 Presence of right artificial knee joint: Secondary | ICD-10-CM | POA: Diagnosis not present

## 2017-11-07 DIAGNOSIS — R112 Nausea with vomiting, unspecified: Secondary | ICD-10-CM | POA: Diagnosis not present

## 2017-11-07 DIAGNOSIS — E119 Type 2 diabetes mellitus without complications: Secondary | ICD-10-CM | POA: Insufficient documentation

## 2017-11-07 LAB — CBC
HCT: 42.7 % (ref 39.0–52.0)
HEMOGLOBIN: 14 g/dL (ref 13.0–17.0)
MCH: 30.6 pg (ref 26.0–34.0)
MCHC: 32.8 g/dL (ref 30.0–36.0)
MCV: 93.2 fL (ref 78.0–100.0)
Platelets: 195 10*3/uL (ref 150–400)
RBC: 4.58 MIL/uL (ref 4.22–5.81)
RDW: 13.9 % (ref 11.5–15.5)
WBC: 5.1 10*3/uL (ref 4.0–10.5)

## 2017-11-07 LAB — COMPREHENSIVE METABOLIC PANEL
ALBUMIN: 3.7 g/dL (ref 3.5–5.0)
ALK PHOS: 112 U/L (ref 38–126)
ALT: 73 U/L — AB (ref 0–44)
AST: 58 U/L — ABNORMAL HIGH (ref 15–41)
Anion gap: 14 (ref 5–15)
BUN: 14 mg/dL (ref 8–23)
CALCIUM: 9.2 mg/dL (ref 8.9–10.3)
CO2: 18 mmol/L — AB (ref 22–32)
CREATININE: 0.89 mg/dL (ref 0.61–1.24)
Chloride: 102 mmol/L (ref 98–111)
GFR calc Af Amer: >60 mL/min (ref >60)
GFR calc non Af Amer: >60 mL/min (ref >60)
GLUCOSE: 163 mg/dL — AB (ref 70–99)
Potassium: 3.9 mmol/L (ref 3.5–5.1)
SODIUM: 134 mmol/L — AB (ref 135–145)
Total Bilirubin: 1 mg/dL (ref 0.3–1.2)
Total Protein: 6.9 g/dL (ref 6.5–8.1)

## 2017-11-07 LAB — URINALYSIS, ROUTINE W REFLEX MICROSCOPIC
Bilirubin Urine: NEGATIVE
GLUCOSE, UA: NEGATIVE mg/dL
Hgb urine dipstick: NEGATIVE
Ketones, ur: NEGATIVE mg/dL
Nitrite: NEGATIVE
PROTEIN: NEGATIVE mg/dL
Specific Gravity, Urine: 1.003 — ABNORMAL LOW (ref 1.005–1.030)
pH: 7 (ref 5.0–8.0)

## 2017-11-07 LAB — LIPASE, BLOOD: Lipase: 30 U/L (ref 11–51)

## 2017-11-07 LAB — CBG MONITORING, ED: Glucose-Capillary: 154 mg/dL — ABNORMAL HIGH (ref 70–99)

## 2017-11-07 LAB — TROPONIN I

## 2017-11-07 MED ORDER — LACTATED RINGERS IV BOLUS
1000.0000 mL | Freq: Once | INTRAVENOUS | Status: AC
  Administered 2017-11-07: 1000 mL via INTRAVENOUS

## 2017-11-07 NOTE — ED Provider Notes (Signed)
[facility] EMERGENCY DEPARTMENT Provider Note   CSN: [id number] Arrival date & time: 11/07/17  1233     History   Chief Complaint Chief Complaint  Patient presents with  . Emesis    HPI Samuel [name] is a 72 y.o. male.  HPI 72 year old male with PMH of prostate cancer, T2DM, new diagnosis, and cholesterol, presents status post near syncope.  Patient states that he was at home moving furniture and became acutely nauseous, lightheaded, diaphoretic, and with tunnel vision.  Denies chest pain cardiac.  Denies palpitations.  Denies LOC.  Patient states that he was recently diagnosed with diabetes and started metformin yesterday and has taken 2 pills.  Patient unable to take blood sugar at home.  Patient sat down needs something and felt better.  Past Medical History:  Diagnosis Date  . Blind left eye   . Cyst in hand 10/2013   left hand, treated with antibiotic & pain medicine  . Diabetes mellitus without complication ([place])    ?? borderline  being checked  . Hepatitis A    25 YRS AGO  . Prostate cancer ([place])   . Sleep apnea    ????   O2 DESAT  10/26/13  IN ED      Patient Active Problem List   Diagnosis Date Noted  . Type 2 diabetes mellitus with diabetic mononeuropathy, without long-term current use of insulin ([place]) 10/13/2016  . Class 2 obesity with serious comorbidity and body mass index (BMI) of 38.0 to 38.9 in adult 10/13/2016  . Prostate cancer ([place]) 10/13/2016  . Gynecomastia, male 10/13/2016  . Mixed hyperlipidemia 10/13/2016  . 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 EYE SURGERY     LEFT AYE AS CHILD   . JOINT REPLACEMENT     LT KNEE 3 YRS AGO   . 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 TOTAL KNEE ARTHROPLASTY Right 12/04/2013   Procedure: TOTAL KNEE ARTHROPLASTY;  Surgeon: Hessie Dibble, MD;  Location: [facility];  Service: Orthopedics;  Laterality: Right;        Home  Medications    Prior to Admission medications   Medication Sig Start Date End Date Taking? Authorizing Provider  albuterol (PROVENTIL HFA;VENTOLIN HFA) 108 (90 Base) MCG/ACT inhaler Inhale 1-2 puffs into the lungs every 6 (six) hours as needed for wheezing or shortness of breath. 09/01/17   Wieters, Hallie C, PA-C  alfuzosin (UROXATRAL) 10 MG 24 hr tablet 1 by mouth daily for bladder 04/04/17   [provider]  aspirin 325 MG tablet Take 325 mg by mouth daily.    [provider]  atorvastatin (LIPITOR) 10 MG tablet Take 1 tablet (10 mg total) by mouth every morning. 08/15/17   [name], DO  enzalutamide [name]) 40 MG capsule Take 4 capsules (160mg ) by mouth once daily as directed by physician. 10/19/17   Wyatt Portela, MD  metFORMIN (GLUCOPHAGE) 500 MG tablet Take 0.5 tablets (250 mg total) by mouth daily with breakfast. For Diabetes DX E11.41 11/02/17   [name], DO  triptorelin (TRELSTAR) 11.25 MG injection Inject 11.25 mg into the muscle. Every 6 months    [provider]    Family History Family History  Problem Relation Age of Onset  . Stroke Mother   . Heart attack Mother   . Diabetes Mother   . Stroke Father   . Stomach cancer Brother   . Prostate  cancer Brother   . Throat cancer Sister     Social History Social History   Tobacco Use  . Smoking status: Former Smoker    Packs/day: 0.50    Years: 5.00    Pack years: 2.50    Types: Cigarettes    Last attempt to quit: 02/16/1983    Years since quitting: 34.7  . Smokeless tobacco: Never Used  Substance Use Topics  . Alcohol use: No    Alcohol/week: 0.0 standard drinks  . Drug use: No     Allergies   Other and Peach [prunus persica]   Review of Systems Review of Systems  Constitutional: Negative for chills and fever.  HENT: Negative for ear pain and sore throat.   Eyes: Negative for pain and visual disturbance.  Respiratory: Negative for cough and shortness of breath.    Cardiovascular: Negative for chest pain and palpitations.  Gastrointestinal: Positive for nausea. Negative for abdominal pain and vomiting.  Genitourinary: Negative for dysuria and hematuria.  Musculoskeletal: Negative for arthralgias and back pain.  Skin: Negative for color change and rash.  Neurological: Positive for light-headedness. Negative for dizziness, seizures and syncope.  All other systems reviewed and are negative.    Physical Exam Updated Vital Signs BP 130/69 (BP Location: Right Arm)   Pulse 60   Temp 98.9 F (37.2 C) (Oral)   Resp 18   SpO2 100%   Physical Exam  Constitutional: He appears well-developed and well-nourished. No distress.  HENT:  Head: Normocephalic and atraumatic.  Eyes: Conjunctivae are normal.  Neck: Neck supple.  Cardiovascular: Normal rate and regular rhythm.  No murmur heard. Pulmonary/Chest: Effort normal and breath sounds normal. No respiratory distress.  Abdominal: Soft. There is no tenderness.  Musculoskeletal: He exhibits no edema.  Neurological: He is alert.  PERRLA, GCS 15, CN II through XII intact, 5/5 motor strength bilateral upper and lower extremities, normal sensation, normal finger-to-nose bilaterally, normal gait, negative Romberg.  Skin: Skin is warm and dry. Capillary refill takes less than 2 seconds. He is not diaphoretic.  Psychiatric: He has a normal mood and affect.  Nursing note and vitals reviewed.    ED Treatments / Results  Labs (all labs ordered are listed, but only abnormal results are displayed) Labs Reviewed  COMPREHENSIVE METABOLIC PANEL - Abnormal; Notable for the following components:      Result Value   Sodium 134 (*)    CO2 18 (*)    Glucose, Bld 163 (*)    AST 58 (*)    ALT 73 (*)    All other components within normal limits  URINALYSIS, ROUTINE W REFLEX MICROSCOPIC - Abnormal; Notable for the following components:   Color, Urine STRAW (*)    Specific Gravity, Urine 1.003 (*)    Leukocytes, UA  LARGE (*)    Bacteria, UA MANY (*)    All other components within normal limits  CBG MONITORING, ED - Abnormal; Notable for the following components:   Glucose-Capillary 154 (*)    All other components within normal limits  LIPASE, BLOOD  CBC  TROPONIN I  TROPONIN I    EKG None  Radiology Dg Chest 2 View  Result Date: 11/07/2017 CLINICAL DATA:  Near syncope EXAM: CHEST - 2 VIEW COMPARISON:  10/26/2013 FINDINGS: The heart size and mediastinal contours are within normal limits. Both lungs are clear. The visualized skeletal structures are unremarkable. IMPRESSION: No active cardiopulmonary disease. Electronically Signed   By: Ulyses Jarred M.D.   On: 11/07/2017  17:25    Procedures Procedures (including critical care time)  Medications Ordered in ED Medications  lactated ringers bolus 1,000 mL (0 mLs Intravenous Stopped 11/07/17 2156)     Initial Impression / Assessment and Plan / ED Course  I have reviewed the triage vital signs and the nursing notes.  Pertinent labs & imaging results that were available during my care of the patient were reviewed by me and considered in my medical decision making (see chart for details).     72 year old male with PMH of prostate cancer, T2DM, new diagnosis, and cholesterol, presents status post near syncope.  History as above.  Differential diagnosis includes stroke, TIA, cardiac arrhythmia, vasovagal syncope, hypoglycemia, versus ACS mimic.  Patient is hemodynamic stable.  Initial blood glucose 154.  Labs and imaging performed.  Patient was CO2 18, thought secondary to dehydration in setting of normal anion gap without evidence of infection on history or physical.  She given 1 L LR.  Patient noted to have questionable urine yet patient is symptomatic.  Patient will not be treated for asymptomatic bacteriuria.  EKG WNL.  CXR normal. Patient troponins negative x2.  Patient work-up negative for cardiogenic cause of syncope.  Patient without  history of physical consistent with neurogenic cause.  Patient with likely vasovagal syncope secondary to dehydration and exertion.  Advise follow-up with PCP for further management.  Advised fluids.  Patient stable for discharge.   Final Clinical Impressions(s) / ED Diagnoses   Final diagnoses:  Dehydration  Nausea vomiting and diarrhea    ED Discharge Orders    None       Keenan Bachelor, MD 11/08/17 3159    Dorie Rank, MD 11/11/17 [phone]

## 2017-11-07 NOTE — ED Provider Notes (Signed)
I saw and evaluated the patient, reviewed the resident's note and I agree with the findings and plan.  EKG: EKG normal sinus rhythm rate 61 Normal axis, normal intervals Normal ST T waves No prior EKG for comparison  Pt presented with nausea, near syncope.  Pt states sx started after taking metformin.  Feeling much better now and sx resolved.   Suspect vasovagal sx.  Bicarb decreased noted.  Plan on fluids, monitor in the ED.  Check cardiac enzymes.     Dorie Rank, MD 11/07/17 [phone]

## 2017-11-07 NOTE — ED Triage Notes (Signed)
Pt in c/o generalized fatigue with n/v that started this morning after taking his diabetic medication, first started taking it yesterday, alert and oriented

## 2017-11-15 ENCOUNTER — Other Ambulatory Visit: Payer: Medicare HMO

## 2017-11-16 ENCOUNTER — Other Ambulatory Visit: Payer: Self-pay | Admitting: *Deleted

## 2017-11-16 DIAGNOSIS — E782 Mixed hyperlipidemia: Secondary | ICD-10-CM

## 2017-11-16 MED ORDER — ATORVASTATIN CALCIUM 10 MG PO TABS: 10.0000 mg | ORAL_TABLET | Freq: Every morning | ORAL | 1 refills | Status: DC

## 2017-11-16 NOTE — Telephone Encounter (Signed)
Patient called requesting refill Faxed to pharmacy.

## 2017-11-18 ENCOUNTER — Ambulatory Visit: Payer: Medicare HMO | Admitting: Oncology

## 2017-11-21 ENCOUNTER — Other Ambulatory Visit: Payer: Self-pay | Admitting: *Deleted

## 2017-11-21 ENCOUNTER — Telehealth: Payer: Self-pay | Admitting: *Deleted

## 2017-11-21 DIAGNOSIS — C61 Malignant neoplasm of prostate: Secondary | ICD-10-CM

## 2017-11-21 MED ORDER — ENZALUTAMIDE 40 MG PO CAPS: ORAL_CAPSULE | ORAL | 0 refills | Status: DC

## 2017-11-21 NOTE — Telephone Encounter (Signed)
Patient calling to request a refill on xtandi. Refilled.

## 2017-12-16 ENCOUNTER — Encounter (HOSPITAL_COMMUNITY): Payer: Self-pay

## 2017-12-16 ENCOUNTER — Encounter (HOSPITAL_COMMUNITY)
Admission: RE | Admit: 2017-12-16 | Discharge: 2017-12-16 | Disposition: A | Discharge status: Home / Self Care | Payer: Medicare HMO | Source: Ambulatory Visit | Attending: Oncology | Admitting: Oncology

## 2017-12-16 ENCOUNTER — Ambulatory Visit (HOSPITAL_COMMUNITY)
Admission: RE | Admit: 2017-12-16 | Discharge: 2017-12-16 | Disposition: A | Discharge status: Home / Self Care | Payer: Medicare HMO | Source: Ambulatory Visit | Attending: Oncology | Admitting: Oncology

## 2017-12-16 ENCOUNTER — Inpatient Hospital Stay: Payer: Medicare HMO | Attending: Oncology

## 2017-12-16 DIAGNOSIS — I700 Atherosclerosis of aorta: Secondary | ICD-10-CM | POA: Insufficient documentation

## 2017-12-16 DIAGNOSIS — R918 Other nonspecific abnormal finding of lung field: Secondary | ICD-10-CM | POA: Insufficient documentation

## 2017-12-16 DIAGNOSIS — C61 Malignant neoplasm of prostate: Secondary | ICD-10-CM

## 2017-12-16 DIAGNOSIS — R911 Solitary pulmonary nodule: Secondary | ICD-10-CM | POA: Insufficient documentation

## 2017-12-16 DIAGNOSIS — M899 Disorder of bone, unspecified: Secondary | ICD-10-CM | POA: Diagnosis not present

## 2017-12-16 DIAGNOSIS — Z7982 Long term (current) use of aspirin: Secondary | ICD-10-CM | POA: Diagnosis not present

## 2017-12-16 DIAGNOSIS — Z79899 Other long term (current) drug therapy: Secondary | ICD-10-CM | POA: Insufficient documentation

## 2017-12-16 LAB — CBC WITH DIFFERENTIAL (CANCER CENTER ONLY)
ABS IMMATURE GRANULOCYTES: 0.03 10*3/uL (ref 0.00–0.07)
BASOS PCT: 1 %
Basophils Absolute: 0 10*3/uL (ref 0.0–0.1)
Eosinophils Absolute: 0 10*3/uL (ref 0.0–0.5)
Eosinophils Relative: 0 %
HCT: 42.6 % (ref 39.0–52.0)
Hemoglobin: 14 g/dL (ref 13.0–17.0)
Immature Granulocytes: 1 %
Lymphocytes Relative: 46 %
Lymphs Abs: 2.7 10*3/uL (ref 0.7–4.0)
MCH: 30.8 pg (ref 26.0–34.0)
MCHC: 32.9 g/dL (ref 30.0–36.0)
MCV: 93.8 fL (ref 80.0–100.0)
MONOS PCT: 10 %
Monocytes Absolute: 0.6 10*3/uL (ref 0.1–1.0)
NEUTROS ABS: 2.4 10*3/uL (ref 1.7–7.7)
Neutrophils Relative %: 42 %
PLATELETS: 210 10*3/uL (ref 150–400)
RBC: 4.54 MIL/uL (ref 4.22–5.81)
RDW: 13.5 % (ref 11.5–15.5)
WBC: 5.7 10*3/uL (ref 4.0–10.5)
nRBC: 0 % (ref 0.0–0.2)

## 2017-12-16 LAB — CMP (CANCER CENTER ONLY)
ALBUMIN: 3.6 g/dL (ref 3.5–5.0)
ALK PHOS: 130 U/L — AB (ref 38–126)
ALT: 43 U/L (ref 0–44)
AST: 35 U/L (ref 15–41)
Anion gap: 11 (ref 5–15)
BILIRUBIN TOTAL: 0.9 mg/dL (ref 0.3–1.2)
BUN: 15 mg/dL (ref 8–23)
CALCIUM: 9.3 mg/dL (ref 8.9–10.3)
CO2: 21 mmol/L — ABNORMAL LOW (ref 22–32)
CREATININE: 0.99 mg/dL (ref 0.61–1.24)
Chloride: 107 mmol/L (ref 98–111)
GFR, Est AFR Am: >60 mL/min (ref >60)
GLUCOSE: 130 mg/dL — AB (ref 70–99)
Potassium: 4 mmol/L (ref 3.5–5.1)
Sodium: 139 mmol/L (ref 135–145)
TOTAL PROTEIN: 7.4 g/dL (ref 6.5–8.1)

## 2017-12-16 MED ORDER — SODIUM CHLORIDE 0.9 % IJ SOLN
INTRAMUSCULAR | Status: AC
  Filled 2017-12-16: qty 50

## 2017-12-16 MED ORDER — TECHNETIUM TC 99M MEDRONATE IV KIT
20.0000 | PACK | Freq: Once | INTRAVENOUS | Status: AC | PRN
  Administered 2017-12-16: 22 via INTRAVENOUS

## 2017-12-16 MED ORDER — IOHEXOL 300 MG/ML  SOLN
100.0000 mL | Freq: Once | INTRAMUSCULAR | Status: AC | PRN
  Administered 2017-12-16: 100 mL via INTRAVENOUS

## 2017-12-16 MED ORDER — TECHNETIUM TC 99M MEDRONATE IV KIT: 22.0000 | PACK | Freq: Once | INTRAVENOUS | Status: DC

## 2017-12-17 LAB — PROSTATE-SPECIFIC AG, SERUM (LABCORP): Prostate Specific Ag, Serum: 3.2 ng/mL (ref 0.0–4.0)

## 2017-12-20 ENCOUNTER — Inpatient Hospital Stay (HOSPITAL_BASED_OUTPATIENT_CLINIC_OR_DEPARTMENT_OTHER): Payer: Medicare HMO | Admitting: Oncology

## 2017-12-20 ENCOUNTER — Telehealth: Payer: Self-pay | Admitting: Oncology

## 2017-12-20 DIAGNOSIS — Z79899 Other long term (current) drug therapy: Secondary | ICD-10-CM

## 2017-12-20 DIAGNOSIS — Z7982 Long term (current) use of aspirin: Secondary | ICD-10-CM | POA: Diagnosis not present

## 2017-12-20 DIAGNOSIS — C61 Malignant neoplasm of prostate: Secondary | ICD-10-CM | POA: Diagnosis not present

## 2017-12-20 DIAGNOSIS — I700 Atherosclerosis of aorta: Secondary | ICD-10-CM

## 2017-12-20 DIAGNOSIS — R918 Other nonspecific abnormal finding of lung field: Secondary | ICD-10-CM | POA: Diagnosis not present

## 2017-12-20 MED ORDER — ENZALUTAMIDE 40 MG PO CAPS: ORAL_CAPSULE | ORAL | 3 refills | Status: DC

## 2017-12-20 NOTE — Telephone Encounter (Signed)
Appts scheduled avs/calendar printed per 11/5 los °

## 2017-12-20 NOTE — Progress Notes (Signed)
Hematology and Oncology Follow Up Visit  Samuel [name] [id number] 10-Jun-1945 72 y.o. 12/20/2017 10:21 AM Samuel [name], [place], [name], [place]   Principle Diagnosis: 72 year old man with advanced prostate cancer diagnosed in 2017.   He was diagnosed in 2010 with Gleason score 4+4 = 8 and a PSA of 8.97.   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  Prior Therapy: He was treated with external beam radiation and androgen deprivation.  He developed biochemical relapse with a PSA rise and he has been on combined androgen deprivation with [place] agonist on bicalutamide. PSA in June 2016 was 6.11, in December 2016 was 10, in June 2017 was 9 and in September 2017 was up to 12.  Staging workup including CT scan of the abdomen and pelvis as well as bone scan showed no evidence of metastatic disease.  His PSA was up to 22.5 in April 2018.   Current therapy:   Androgen Deprivation given under the care of Dr. Junious [name].  Xtandi 160 mg daily started in May 2018.  Interim History: Samuel [name] returns today for a repeat evaluation.  Since last visit, he reports no major changes or complaints.  He remains active and attends to activities of daily living.  He has tolerated Xtandi without any recent side effects.  He denies any fatigue, bone pain or changes in his urination habits.  He does report some frequency and nocturia which has not dramatically changed.  His performance status and quality of life remains unchanged.  He denies any recent hospitalization or illnesses.  He does not report any headaches, blurry vision, syncope or seizures.  He denies any dizziness or lethargy.  He does not report any fevers, chills, sweats or weight loss. He does not report any chest pain, palpitation, orthopnea or leg edema. He does not report any cough, wheezing or hemoptysis. He does not report any nausea, vomiting or abdominal distention.  He denies any constipation or diarrhea.  He  denies any hematochezia or melena.  He does not report any frequency urgency or hesitancy.  He does not report any ecchymosis or petechiae.  Denies any bone pain or pathological fractures.  Remaining review of systems negative.  Medications: I have reviewed the patient's current medications.  Current Outpatient Medications  Medication Sig Dispense Refill  . albuterol (PROVENTIL HFA;VENTOLIN HFA) 108 (90 Base) MCG/ACT inhaler Inhale 1-2 puffs into the lungs every 6 (six) hours as needed for wheezing or shortness of breath. 1 Inhaler 0  . alfuzosin (UROXATRAL) 10 MG 24 hr tablet 1 by mouth daily for bladder    . aspirin 325 MG tablet Take 325 mg by mouth daily.    Marland Kitchen atorvastatin (LIPITOR) 10 MG tablet Take 1 tablet (10 mg total) by mouth every morning. 90 tablet 1  . enzalutamide (XTANDI) 40 MG capsule Take 4 capsules (160mg ) by mouth once daily as directed by physician. 120 capsule 0  . metFORMIN (GLUCOPHAGE) 500 MG tablet Take 0.5 tablets (250 mg total) by mouth daily with breakfast. For Diabetes DX E11.41 15 tablet 4  . triptorelin (TRELSTAR) 11.25 MG injection Inject 11.25 mg into the muscle. Every 6 months     No current facility-administered medications for this visit.    Facility-Administered Medications Ordered in Other Visits  Medication Dose Route Frequency Provider Last Rate Last Dose  . technetium medronate (TC-MDP) injection 22 millicurie  22 millicurie Intravenous Once Samuel Height, MD         Allergies:  Allergies  Allergen Reactions  .  Other Other (See Comments)    Patient stated,"if I touch felt (material) I get "knots" on my body."  . Peach [Prunus Persica] Other (See Comments)    Patient stated,"if I touch or eat peach fuzz, I get "knots" all over my face and mouth."     Past Medical History, Surgical history, Social history, and Family History without any changes on review.   Physical Exam: Blood pressure (!) 150/69, pulse 75, temperature 98 F (36.7 C), temperature  source Oral, resp. rate 17, [name] 5\' 9"  (1.753 m), weight 270 lb (122.5 kg), SpO2 100 %.    ECOG: 0   General appearance: Comfortable appearing without any discomfort Head: Normocephalic without any trauma Oropharynx: Mucous membranes are moist and pink without any thrush or ulcers. Eyes: Pupils are equal and round reactive to light. Lymph nodes: No cervical, supraclavicular, inguinal or axillary lymphadenopathy.   Heart:regular rate and rhythm.  S1 and S2 without leg edema. Lung: Clear without any rhonchi or wheezes.  No dullness to percussion. Abdomin: Soft, nontender, nondistended with good bowel sounds.  No hepatosplenomegaly. Musculoskeletal: No joint deformity or effusion.  Full range of motion noted. Neurological: No deficits noted on motor, sensory and deep tendon reflex exam. Skin: No petechial rash or dryness.  Appeared moist.     Lab Results: Lab Results  Component Value Date   WBC 5.7 12/16/2017   HGB 14.0 12/16/2017   HCT 42.6 12/16/2017   MCV 93.8 12/16/2017   PLT 210 12/16/2017     Chemistry      Component Value Date/Time   NA 139 12/16/2017 1120   NA 137 12/15/2016 1421   K 4.0 12/16/2017 1120   K 4.4 12/15/2016 1421   CL 107 12/16/2017 1120   CO2 21 (L) 12/16/2017 1120   CO2 19 (L) 12/15/2016 1421   BUN 15 12/16/2017 1120   BUN 13.4 12/15/2016 1421   CREATININE 0.99 12/16/2017 1120   CREATININE 0.8 12/15/2016 1421      Component Value Date/Time   CALCIUM 9.3 12/16/2017 1120   CALCIUM 9.4 12/15/2016 1421   ALKPHOS 130 (H) 12/16/2017 1120   ALKPHOS 92 12/15/2016 1421   AST 35 12/16/2017 1120   AST 17 12/15/2016 1421   ALT 43 12/16/2017 1120   ALT 12 12/15/2016 1421   BILITOT 0.9 12/16/2017 1120   BILITOT 0.37 12/15/2016 1421         Results for Samuel [name] (MRN [id number]) as of 12/20/2017 10:27  Ref. Range 09/15/2017 10:41 12/16/2017 11:20  Prostate Specific Ag, Serum Latest Ref Range: 0.0 - 4.0 ng/mL 3.4 3.2    EXAM: CT ABDOMEN AND  PELVIS WITH CONTRAST  TECHNIQUE: Multidetector CT imaging of the abdomen and pelvis was performed using the standard protocol following bolus administration of intravenous contrast.  CONTRAST:  183mL OMNIPAQUE IOHEXOL 300 MG/ML  SOLN  COMPARISON:  Bone scan 12/16/2017 and CT pelvis 12/02/2015.  FINDINGS: Lower chest: No acute abnormality. Small pulmonary nodule in the right middle lobe measures 4 mm, image 14/7. Perifissural nodule in the left lower lobe measures 4 mm, image 1/7.  Hepatobiliary: No focal liver abnormality is seen. No gallstones, gallbladder wall thickening, or biliary dilatation.  Pancreas: Unremarkable. No pancreatic ductal dilatation or surrounding inflammatory changes.  Spleen: Normal in size without focal abnormality.  Adrenals/Urinary Tract: Adrenal glands are unremarkable. Kidneys are normal, without renal calculi, focal lesion, or hydronephrosis. Bladder is unremarkable.  Stomach/Bowel: Stomach is within normal limits. Appendix appears normal. No evidence of bowel wall  thickening, distention, or inflammatory changes.  Vascular/Lymphatic: Aortic atherosclerosis. No aneurysm. The portal vein and hepatic veins appear patent. No abdominopelvic adenopathy identified.  Reproductive: Prostate gland appears small.  No mass identified.  Other: No abdominal wall hernia or abnormality. No abdominopelvic ascites.  Musculoskeletal: Spondylosis identified within the lumbar spine. There is asymmetric increased sclerosis involving the left femoral head which appears unchanged from 12/02/2015 with out corresponding increased uptake on bone scan from today.  IMPRESSION: 1. No mass or adenopathy identified within the abdomen or pelvis. 2. Small pulmonary nodule in the right middle lobe measuring 4 mm. No follow-up needed if patient is low-risk. Non-contrast chest CT can be considered in 12 months if patient is high-risk. This recommendation  follows the consensus statement: Guidelines for Management of Incidental Pulmonary Nodules Detected on CT Images: From the Fleischner Society 2017; Radiology 2017; 284:228-243. 3. Asymmetric sclerosis of the left femoral head noted. Unchanged from 12/02/2015. The etiology indeterminate. 4.  Aortic Atherosclerosis (ICD10-I70.0).   EXAM: NUCLEAR MEDICINE WHOLE BODY BONE SCAN  TECHNIQUE: Whole body anterior and posterior images were obtained approximately 3 hours after intravenous injection of radiopharmaceutical.  RADIOPHARMACEUTICALS:  22 mCi Technetium-6m MDP IV  COMPARISON:  Nuclear medicine bone scan 12/02/2015. CT abdomen and pelvis 12/16/2017. Chest radiographs 11/07/2017.  FINDINGS: There is symmetric radiotracer uptake by the kidneys with excreted tracer in the bladder. Uptake at both sternoclavicular joints is unchanged and likely degenerative. Photopenic defects are noted at the knees consistent with prior arthroplasties. Bilateral uptake in the mid to lower lumbar spine is likely related to underlying disc and facet degeneration. No focal uptake specifically suggestive of metastatic disease is identified in the skeleton.  IMPRESSION: No evidence of osseous metastatic disease.    Impression and Plan:   72 year old man with  1.  Advanced prostate cancer documented in 2017.  He has castration-resistant without any measurable disease at this time.  He continues to tolerate Xtandi without any major complications.  His PSA continues to be under reasonable control without any rapid rise at this time.  CT scan and bone scan obtained on 12/16/2017 were personally reviewed and showed no evidence of metastatic disease.  Risks and benefits of continuing this current therapy was discussed and he is agreeable to continue.  Alternative treatment options including systemic chemotherapy, Zytiga, Trudi Ida among others will be considered if he develops advanced measurable disease  in the future.  He is agreeable with this plan at this time.    2. Androgen depravation: Long-term complication associated with androgen deprivation was reviewed today.  I recommended continuing this indefinitely.  He is agreeable to do so under the care of Dr. Junious [name].   3. Follow-up: Will be in 3 months   15  minutes was spent with the patient face-to-face today.  More than 50% of time was dedicated to reviewing his disease status, imaging studies and coordinating future plan of care.  Zola Button, MD 11/5/201910:21 AM

## 2018-01-03 ENCOUNTER — Telehealth: Payer: Self-pay

## 2018-01-03 NOTE — Telephone Encounter (Signed)
Oral Oncology Patient Advocate Encounter  I received a fax from [employer] to renew the manufacturer assistance application for 2020. I called the patient to make sure his insurance, income and address was still the same and it is. Dr. Shadad signed the application and I faxed it today 01/03/18.  This encounter will be updated until final determination.  Laelle Bridgett CPHT Specialty Pharmacy Patient Advocate [facility] Phone [phone] Fax [phone] 

## 2018-01-18 ENCOUNTER — Other Ambulatory Visit: Payer: Self-pay | Admitting: *Deleted

## 2018-01-18 DIAGNOSIS — C61 Malignant neoplasm of prostate: Secondary | ICD-10-CM

## 2018-01-18 MED ORDER — ENZALUTAMIDE 40 MG PO CAPS: ORAL_CAPSULE | ORAL | 0 refills | Status: DC

## 2018-01-31 NOTE — Telephone Encounter (Signed)
Oral Oncology Patient Advocate Encounter  Samuel [name] is requesting income documents. I called the patient and he brought his income documents today and I faxed them 01/31/18.  This encounter will be updated until final determination  [place] Patient [name] Phone [phone] Fax [phone]

## 2018-02-10 NOTE — Telephone Encounter (Signed)
Oral Oncology Patient Advocate Encounter  I called [employer] support solutions to follow up on the status of the renewal application. [employer] has everything they need from us and the application is still in processing.  Samuel [name] CPHT Specialty Pharmacy Patient Advocate [facility] Phone [phone] Fax [phone]   

## 2018-02-23 ENCOUNTER — Telehealth: Payer: Self-pay

## 2018-02-23 NOTE — Telephone Encounter (Signed)
Oral Oncology Patient Advocate Encounter  Received notification from [facility] that prior authorization for [name] is required.  PA submitted on CoverMyMeds Key [id number] Status is pending  Oral Oncology Clinic will continue to follow.  [place] Patient [place] Phone [phone] Fax [phone]

## 2018-02-23 NOTE — Telephone Encounter (Signed)
Oral Oncology Patient Advocate Encounter  Prior Authorization for Samuel [name] has been approved.    PA# [id number] Effective dates: 02/23/18 through 08/22/18  Oral Oncology Clinic will continue to follow.   [place] Patient [name] Phone [phone] Fax [phone]

## 2018-02-28 ENCOUNTER — Telehealth: Payer: Self-pay | Admitting: Pharmacist

## 2018-02-28 ENCOUNTER — Telehealth: Payer: Self-pay

## 2018-02-28 DIAGNOSIS — C61 Malignant neoplasm of prostate: Secondary | ICD-10-CM

## 2018-02-28 MED ORDER — ENZALUTAMIDE 40 MG PO CAPS: ORAL_CAPSULE | ORAL | 1 refills | Status: DC

## 2018-02-28 NOTE — Telephone Encounter (Signed)
Oral Oncology Patient Advocate Encounter  Samuel [name] support solutions requires [place] to apply for a grant when grant funding is available during the enrollment or re-enrollment. I was able to secure a grant and this information will be a separate encounter.  [place] Patient [place] Phone [phone] Fax [phone]

## 2018-02-28 NOTE — Telephone Encounter (Signed)
Oral Chemotherapy Pharmacist Encounter  Follow-Up Form  Spoke with patient today to follow up regarding patient's oral chemotherapy medication: Xtandi (enzalutamide) for the treatment of recurrent, castration-resistant prostate cancer in conjunction with androgen deprivation therapy (Trelstar administered Q6 months at [name], last administration 10/19/17), panned duration until disease progression or unacceptable toxicity.  Original Start date of oral chemotherapy: 07/04/2016  Pt is doing well today  Pt reports 0 tablets/doses of Xtandi 40mg  capsules, 4 capsules (160mg ) taken by mouth once daily, without regard to food, missed in the last month.  Pt reports the following side effects: none to report due to [facility] or androgen deprivation. Patient states his energy level is adequate.  Pertinent labs reviewed: [place] for continued treatment.  Other Issues: Patient is being transitioned from receiving his Xtandi through manufacturer assistance program and will now receive his [name] from the [facility] long outpatient pharmacy, copayment will be covered with a foundation grant.  Prescription for Xtandi 160 mg daily has been sent to the pharmacy, written with enough refills to last until next office visit.  Next fill of [name] will ship from the [place] long outpatient pharmacy on 03/21/2018 to deliver at patient's home on 03/22/2018.   Confirmed lab visit on 2/5 at 10am and OV with Dr. Alen Blew on 2/7 at 1030am with patient.  Patient knows to call the office with questions or concerns. Oral Oncology Clinic will continue to follow.  Johny Drilling, PharmD, BCPS, BCOP  02/28/2018 11:07 AM Oral Oncology Clinic [phone]

## 2018-02-28 NOTE — Telephone Encounter (Signed)
Oral Oncology Patient Advocate Lauro [facility]ulus support solutions require [place] to apply for a grant if there is funding available during the re-enrollment period. Mr. Sooy will now get his [name] from [place].  I was successful at securing a grant with [employer] (TA[employer]). This will keep the out of pocket expense for Xtandi at $0. The grant information is as follows and has been shared with [place].  This a conditional approval. The patient will receive a letter that he needs to complete and send back in 5-7 business days.  Approval dates: 02/20/18-03/22/18 ID: [id number] Group: [id number] BIN: [id number] PCN: [id number]  I called the patient and gave him the good news and he verbalized understanding and great appreciation.   [place] [facility] [place] Phone [phone] Fax [phone]

## 2018-03-02 NOTE — Telephone Encounter (Signed)
Oral Oncology Patient Advocate Encounter  [facility] [place] has been extended until 02/15/19.  [place] [facility] [place] Phone [phone] Fax [phone]

## 2018-03-21 MED FILL — XTANDI 40 MG CAPSULE: 40 | 30 days supply | Qty: 120 | Fill #0

## 2018-03-22 ENCOUNTER — Inpatient Hospital Stay: Payer: 59 | Attending: Oncology

## 2018-03-22 DIAGNOSIS — Z7984 Long term (current) use of oral hypoglycemic drugs: Secondary | ICD-10-CM | POA: Diagnosis not present

## 2018-03-22 DIAGNOSIS — Z79899 Other long term (current) drug therapy: Secondary | ICD-10-CM | POA: Diagnosis not present

## 2018-03-22 DIAGNOSIS — I700 Atherosclerosis of aorta: Secondary | ICD-10-CM | POA: Insufficient documentation

## 2018-03-22 DIAGNOSIS — R351 Nocturia: Secondary | ICD-10-CM | POA: Insufficient documentation

## 2018-03-22 DIAGNOSIS — Z7982 Long term (current) use of aspirin: Secondary | ICD-10-CM | POA: Insufficient documentation

## 2018-03-22 DIAGNOSIS — Z192 Hormone resistant malignancy status: Secondary | ICD-10-CM | POA: Insufficient documentation

## 2018-03-22 DIAGNOSIS — C61 Malignant neoplasm of prostate: Secondary | ICD-10-CM | POA: Insufficient documentation

## 2018-03-22 DIAGNOSIS — Z923 Personal history of irradiation: Secondary | ICD-10-CM | POA: Diagnosis not present

## 2018-03-22 DIAGNOSIS — R918 Other nonspecific abnormal finding of lung field: Secondary | ICD-10-CM | POA: Insufficient documentation

## 2018-03-22 LAB — CMP (CANCER CENTER ONLY)
ALK PHOS: 130 U/L — AB (ref 38–126)
ALT: 26 U/L (ref 0–44)
AST: 27 U/L (ref 15–41)
Albumin: 3.7 g/dL (ref 3.5–5.0)
Anion gap: 9 (ref 5–15)
BILIRUBIN TOTAL: 0.5 mg/dL (ref 0.3–1.2)
BUN: 13 mg/dL (ref 8–23)
CALCIUM: 9.5 mg/dL (ref 8.9–10.3)
CO2: 21 mmol/L — ABNORMAL LOW (ref 22–32)
Chloride: 107 mmol/L (ref 98–111)
Creatinine: 0.88 mg/dL (ref 0.61–1.24)
GFR, Est AFR Am: >60 mL/min (ref >60)
Glucose, Bld: 125 mg/dL — ABNORMAL HIGH (ref 70–99)
POTASSIUM: 4.3 mmol/L (ref 3.5–5.1)
Sodium: 137 mmol/L (ref 135–145)
TOTAL PROTEIN: 7.4 g/dL (ref 6.5–8.1)

## 2018-03-22 LAB — CBC WITH DIFFERENTIAL (CANCER CENTER ONLY)
Abs Immature Granulocytes: 0.02 10*3/uL (ref 0.00–0.07)
BASOS PCT: 1 %
Basophils Absolute: 0.1 10*3/uL (ref 0.0–0.1)
EOS ABS: 0.1 10*3/uL (ref 0.0–0.5)
EOS PCT: 2 %
HCT: 41.8 % (ref 39.0–52.0)
Hemoglobin: 14.1 g/dL (ref 13.0–17.0)
Immature Granulocytes: 0 %
Lymphocytes Relative: 43 %
Lymphs Abs: 2.6 10*3/uL (ref 0.7–4.0)
MCH: 31.6 pg (ref 26.0–34.0)
MCHC: 33.7 g/dL (ref 30.0–36.0)
MCV: 93.7 fL (ref 80.0–100.0)
Monocytes Absolute: 0.6 10*3/uL (ref 0.1–1.0)
Monocytes Relative: 11 %
Neutro Abs: 2.6 10*3/uL (ref 1.7–7.7)
Neutrophils Relative %: 43 %
PLATELETS: 204 10*3/uL (ref 150–400)
RBC: 4.46 MIL/uL (ref 4.22–5.81)
RDW: 13.3 % (ref 11.5–15.5)
WBC: 6 10*3/uL (ref 4.0–10.5)
nRBC: 0 % (ref 0.0–0.2)

## 2018-03-23 LAB — PROSTATE-SPECIFIC AG, SERUM (LABCORP): Prostate Specific Ag, Serum: 3.3 ng/mL (ref 0.0–4.0)

## 2018-03-24 ENCOUNTER — Inpatient Hospital Stay (HOSPITAL_BASED_OUTPATIENT_CLINIC_OR_DEPARTMENT_OTHER): Payer: 59 | Admitting: Oncology

## 2018-03-24 VITALS — BP 156/71 | HR 95 | Temp 97.6°F | Resp 18 | Ht 69.0 in | Wt 267.7 lb

## 2018-03-24 DIAGNOSIS — Z192 Hormone resistant malignancy status: Secondary | ICD-10-CM

## 2018-03-24 DIAGNOSIS — Z7984 Long term (current) use of oral hypoglycemic drugs: Secondary | ICD-10-CM | POA: Diagnosis not present

## 2018-03-24 DIAGNOSIS — R918 Other nonspecific abnormal finding of lung field: Secondary | ICD-10-CM | POA: Diagnosis not present

## 2018-03-24 DIAGNOSIS — Z79899 Other long term (current) drug therapy: Secondary | ICD-10-CM | POA: Diagnosis not present

## 2018-03-24 DIAGNOSIS — Z7982 Long term (current) use of aspirin: Secondary | ICD-10-CM

## 2018-03-24 DIAGNOSIS — Z923 Personal history of irradiation: Secondary | ICD-10-CM | POA: Diagnosis not present

## 2018-03-24 DIAGNOSIS — C61 Malignant neoplasm of prostate: Secondary | ICD-10-CM | POA: Diagnosis not present

## 2018-03-24 DIAGNOSIS — R351 Nocturia: Secondary | ICD-10-CM | POA: Diagnosis not present

## 2018-03-24 DIAGNOSIS — I700 Atherosclerosis of aorta: Secondary | ICD-10-CM

## 2018-03-24 NOTE — Progress Notes (Signed)
Hematology and Oncology Follow Up Visit  Samuel [name] [id number] [birth year] 73 y.o. 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  He presented with Gleason score 4+4 = 8 and a PSA of 8.97 in 2010.    Prior Therapy: He was treated with external beam radiation and androgen deprivation.  He developed biochemical relapse with a PSA rise and he has been on combined androgen deprivation with [place] agonist on bicalutamide. PSA in June 2016 was 6.11, in December 2016 was 10, in June 2017 was 9 and in September 2017 was up to 12.  Staging workup including CT scan of the abdomen and pelvis as well as bone scan showed no evidence of metastatic disease.  His PSA was up to 22.5 in April 2018.   Current therapy:   Androgen Deprivation given under the care of Dr. Junious [name].  Xtandi 160 mg daily started in May 2018.  Interim History: Samuel [name] is here for a follow-up.  Last visit, he continues to tolerate Xtandi without any major complications.  He denies excessive fatigue, tiredness or pathological fractures.  He denies any bone pain or hospitalizations.  He does report occasional frequency and nocturia but no hematuria or dysuria.  His quality of life and performance status remains unchanged.  Continues to tolerate Lupron under care of Dr. Junious [name] without any issues.  Patient denied any alteration mental status, neuropathy, confusion or dizziness.  Denies any headaches or lethargy.  Denies any night sweats, weight loss or changes in appetite.  Denied orthopnea, dyspnea on exertion or chest discomfort.  Denies shortness of breath, difficulty breathing hemoptysis or cough.  Denies any abdominal distention, nausea, early satiety or dyspepsia.  Denies any hematuria, frequency, dysuria or nocturia.  Denies any skin irritation, dryness or rash.  Denies any  ecchymosis or petechiae.  Denies any lymphadenopathy or clotting.  Denies any heat or cold intolerance.  Denies any anxiety or depression.  Remaining review of system is negative.       Medications: I have reviewed the patient's current medications.  Current Outpatient Medications  Medication Sig Dispense Refill  . albuterol (PROVENTIL HFA;VENTOLIN HFA) 108 (90 Base) MCG/ACT inhaler Inhale 1-2 puffs into the lungs every 6 (six) hours as needed for wheezing or shortness of breath. 1 Inhaler 0  . alfuzosin (UROXATRAL) 10 MG 24 hr tablet 1 by mouth daily for bladder    . aspirin 325 MG tablet Take 325 mg by mouth daily.    Marland Kitchen atorvastatin (LIPITOR) 10 MG tablet Take 1 tablet (10 mg total) by mouth every morning. 90 tablet 1  . enzalutamide (XTANDI) 40 MG capsule Take 4 capsules (160mg ) by mouth once daily. 120 capsule 1  . metFORMIN (GLUCOPHAGE) 500 MG tablet Take 0.5 tablets (250 mg total) by mouth daily with breakfast. For Diabetes DX E11.41 15 tablet 4  . triptorelin (TRELSTAR) 11.25 MG injection Inject 11.25 mg into the muscle. Every 6 months     No current facility-administered medications for this visit.      Allergies:  Allergies  Allergen Reactions  . Other Other (See Comments)    Patient stated,"if I touch felt (material) I get "knots" on my body."  . Peach [Prunus Persica] Other (See Comments)    Patient stated,"if I touch or eat peach fuzz, I get "knots" all over my face and mouth."     Past Medical History, Surgical history, Social  history, and Family History without any changes on review.   Physical Exam:  Blood pressure (!) 156/71, pulse 95, temperature 97.6 F (36.4 C), temperature source Oral, resp. rate 18, height 5\' 9"  (1.753 m), weight 267 lb 11.2 oz (121.4 kg), SpO2 98 %.    ECOG: 0     General appearance: Alert, awake without any distress. Head: Atraumatic without abnormalities Oropharynx: Without any thrush or ulcers. Eyes: No scleral icterus. Lymph  nodes: No lymphadenopathy noted in the cervical, supraclavicular, or axillary nodes Heart:regular rate and rhythm, without any murmurs or gallops.   Lung: Clear to auscultation without any rhonchi, wheezes or dullness to percussion. Abdomin: Soft, nontender without any shifting dullness or ascites. Musculoskeletal: No clubbing or cyanosis. Neurological: No motor or sensory deficits. Skin: No rashes or lesions. Psychiatric: Mood and affect appeared normal.      Lab Results: Lab Results  Component Value Date   WBC 6.0 03/22/2018   HGB 14.1 03/22/2018   HCT 41.8 03/22/2018   MCV 93.7 03/22/2018   PLT 204 03/22/2018     Chemistry      Component Value Date/Time   NA 137 03/22/2018 0946   NA 137 12/15/2016 1421   K 4.3 03/22/2018 0946   K 4.4 12/15/2016 1421   CL 107 03/22/2018 0946   CO2 21 (L) 03/22/2018 0946   CO2 19 (L) 12/15/2016 1421   BUN 13 03/22/2018 0946   BUN 13.4 12/15/2016 1421   CREATININE 0.88 03/22/2018 0946   CREATININE 0.8 12/15/2016 1421      Component Value Date/Time   CALCIUM 9.5 03/22/2018 0946   CALCIUM 9.4 12/15/2016 1421   ALKPHOS 130 (H) 03/22/2018 0946   ALKPHOS 92 12/15/2016 1421   AST 27 03/22/2018 0946   AST 17 12/15/2016 1421   ALT 26 03/22/2018 0946   ALT 12 12/15/2016 1421   BILITOT 0.5 03/22/2018 0946   BILITOT 0.37 12/15/2016 1421      Results for Samuel [name] (MRN [id number]) as of 03/24/2018 09:55  Ref. Range 12/16/2017 11:20 03/22/2018 09:46  Prostate Specific Ag, Serum Latest Ref Range: 0.0 - 4.0 ng/mL 3.2 3.3      IMPRESSION: 1. No mass or adenopathy identified within the abdomen or pelvis. 2. Small pulmonary nodule in the right middle lobe measuring 4 mm. No follow-up needed if patient is low-risk. Non-contrast chest CT can be considered in 12 months if patient is high-risk. This recommendation follows the consensus statement: Guidelines for Management of Incidental Pulmonary Nodules Detected on CT Images: From the  Fleischner Society 2017; Radiology 2017; 284:228-243. 3. Asymmetric sclerosis of the left femoral head noted. Unchanged from 12/02/2015. The etiology indeterminate. 4.  Aortic Atherosclerosis (ICD10-I70.0).   EXAM: NUCLEAR MEDICINE WHOLE BODY BONE SCAN  TECHNIQUE: Whole body anterior and posterior images were obtained approximately 3 hours after intravenous injection of radiopharmaceutical.  RADIOPHARMACEUTICALS:  22 mCi Technetium-65m MDP IV  COMPARISON:  Nuclear medicine bone scan 12/02/2015. CT abdomen and pelvis 12/16/2017. Chest radiographs 11/07/2017.  FINDINGS: There is symmetric radiotracer uptake by the kidneys with excreted tracer in the bladder. Uptake at both sternoclavicular joints is unchanged and likely degenerative. Photopenic defects are noted at the knees consistent with prior arthroplasties. Bilateral uptake in the mid to lower lumbar spine is likely related to underlying disc and facet degeneration. No focal uptake specifically suggestive of metastatic disease is identified in the skeleton.  IMPRESSION: No evidence of osseous metastatic disease.    Impression and Plan:   73 year old man with  1.  Castration-sensitive prostate cancer with biochemical relapse only diagnosed in 2017.  He remains on Xtandi with reasonable PSA response.  The natural course of this disease was reviewed today with the patient in detail.  Imaging studies obtained in November 2019 were also reviewed again and discussed with the patient.  At this time I have recommended continuing Xtandi given the fact that his PSA remains under excellent control and no disease progression is noted.  Different salvage therapy such as systemic chemotherapy or Xofigo were reviewed as a potential options in the future.  After discussion today is agreeable to continue.    2. Androgen depravation: I recommended continuing androgen deprivation therapy long-term.  He is currently receiving under  the care of Dr. Junious [name].   3. Follow-up: Will be in 4 months   15  minutes was spent with the patient face-to-face today.  More than 50% of time was spent on reviewing laboratory data, imaging studies and discussing the natural course of his disease and treatment options.  Zola Button, MD 2/7/20209:56 AM

## 2018-04-10 ENCOUNTER — Ambulatory Visit: Payer: Medicare HMO | Admitting: Nurse Practitioner

## 2018-04-10 ENCOUNTER — Ambulatory Visit (INDEPENDENT_AMBULATORY_CARE_PROVIDER_SITE_OTHER): Payer: Medicare HMO | Admitting: Nurse Practitioner

## 2018-04-10 ENCOUNTER — Encounter: Payer: Self-pay | Admitting: Nurse Practitioner

## 2018-04-10 VITALS — BP 138/74 | HR 68 | Temp 97.4°F | Ht 69.0 in | Wt 267.4 lb

## 2018-04-10 DIAGNOSIS — C61 Malignant neoplasm of prostate: Secondary | ICD-10-CM | POA: Diagnosis not present

## 2018-04-10 DIAGNOSIS — Z1159 Encounter for screening for other viral diseases: Secondary | ICD-10-CM

## 2018-04-10 DIAGNOSIS — Z1212 Encounter for screening for malignant neoplasm of rectum: Secondary | ICD-10-CM | POA: Diagnosis not present

## 2018-04-10 DIAGNOSIS — Z1211 Encounter for screening for malignant neoplasm of colon: Secondary | ICD-10-CM | POA: Diagnosis not present

## 2018-04-10 DIAGNOSIS — K5900 Constipation, unspecified: Secondary | ICD-10-CM

## 2018-04-10 DIAGNOSIS — E1141 Type 2 diabetes mellitus with diabetic mononeuropathy: Secondary | ICD-10-CM | POA: Diagnosis not present

## 2018-04-10 DIAGNOSIS — E782 Mixed hyperlipidemia: Secondary | ICD-10-CM | POA: Diagnosis not present

## 2018-04-10 NOTE — Progress Notes (Signed)
Careteam: Patient Care Team: [name], NP as PCP - General (Geriatric Medicine) Festus Aloe, MD as Consulting Physician (Urology)  Advanced Directive information Does Patient Have a Medical Advance Directive?: No, Would patient like information on creating a medical advance directive?: No - Patient declined  Allergies  Allergen Reactions  . Other Other (See Comments)    Patient stated,"if I touch felt (material) I get "knots" on my body."  . Peach [Prunus Persica] Other (See Comments)    Patient stated,"if I touch or eat peach fuzz, I get "knots" all over my face and mouth."     Chief Complaint  Patient presents with  . Medical Management of Chronic Issues    6 month follow up      HPI: Patient is a 73 y.o. male seen in the office today for routine follow up.   Constipation-resolved started using miralax but now not needed since he has increased activity.   Prostate cancer, recurrent -  He is currently on Xtandi (May 2018) due to elevated PSA (up to 22.5 in April 2018). PSA has now improved to 3.3. Followed by H/O Dr Alen Blew. He was 1st dx in 2003 and was tx with external beam XRT. It recurred in 2009 and has been getting hormone injections q56mos currently. He has nocturia 3-4 times per night. He also sees urology Dr Junious Silk. Was taking flomax but did not help so stopped taking.   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  Takes ASA daily. He does not check BS at home. Urine microalbumin/cr ratio 4.0. He is on statin tx. no  numbness/tingling in his feet.   Hyperlipidemia - on lipitor. LDL 108;  Exercises 3-4 times per week (walking, squats, sit ups).   Hx Knee DJD - s/p b/l TKR. Followed by Ortho Dr Rhona Raider at [facility]. No issues.   Last colonoscopy in 2015 in [place] per pt.  cologuard in April 2017 neg. Last colonoscopy was told he had polyps.   Declines all vaccines despite education.   Review of Systems:  Review of Systems  Constitutional: Negative for chills, fever and weight loss.  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   Cardiovascular: Negative for chest pain, palpitations and leg swelling.  Gastrointestinal: Negative for abdominal pain, constipation, diarrhea and heartburn.  Genitourinary: Positive for frequency. Negative for dysuria and urgency.  Musculoskeletal: Negative for back pain, falls, joint pain and myalgias.  Skin: Negative.   Neurological: Negative for dizziness, tingling, weakness and headaches.  Psychiatric/Behavioral: Negative for depression and memory loss. The patient does not have insomnia.     Past Medical History:  Diagnosis Date  . Blind left eye   . Cyst in hand 10/2013   left hand, treated with antibiotic & pain medicine  . Diabetes mellitus without complication ([place])    ?? borderline  being checked  . Hepatitis A    25 YRS AGO  . Prostate cancer ([place])   . Sleep apnea    ????   O2 DESAT  10/26/13  IN ED     Past Surgical History:  Procedure Laterality Date  . EYE SURGERY  LEFT AYE AS CHILD   . JOINT REPLACEMENT     LT KNEE 3 YRS AGO   . 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 TOTAL KNEE ARTHROPLASTY Right 12/04/2013   Procedure: TOTAL KNEE ARTHROPLASTY;  Surgeon: Hessie Dibble, MD;  Location: [facility];  Service: Orthopedics;  Laterality: Right;   Social History:   reports that he quit smoking about 35 years ago. His smoking use included cigarettes. He has a 2.50 pack-year smoking history. He has never used smokeless tobacco. He reports that he does not drink alcohol or use drugs.  Family History  Problem Relation Age of Onset  . Stroke Mother   . Heart attack Mother   . Diabetes Mother   . Stroke Father     . Stomach cancer Brother   . Prostate cancer Brother   . Throat cancer Sister     Medications: Patient's Medications  New Prescriptions   No medications on file  Previous Medications   ASPIRIN 325 MG TABLET    Take 325 mg by mouth daily.   ATORVASTATIN (LIPITOR) 10 MG TABLET    Take 1 tablet (10 mg total) by mouth every morning.   ENZALUTAMIDE (XTANDI) 40 MG CAPSULE    Take 4 capsules (160mg ) by mouth once daily.   TRIPTORELIN (TRELSTAR) 11.25 MG INJECTION    Inject 11.25 mg into the muscle. Every 6 months  Modified Medications   No medications on file  Discontinued Medications   ALBUTEROL (PROVENTIL HFA;VENTOLIN HFA) 108 (90 BASE) MCG/ACT INHALER    Inhale 1-2 puffs into the lungs every 6 (six) hours as needed for wheezing or shortness of breath.   ALFUZOSIN (UROXATRAL) 10 MG 24 HR TABLET    1 by mouth daily for bladder   METFORMIN (GLUCOPHAGE) 500 MG TABLET    Take 0.5 tablets (250 mg total) by mouth daily with breakfast. For Diabetes DX E11.41     Physical Exam:  Vitals:   04/10/18 1031  BP: 138/74  Pulse: 68  Temp: (!) 97.4 F (36.3 C)  TempSrc: Oral  SpO2: 97%  Weight: 267 lb 6.4 oz (121.3 kg)  Height: 5\' 9"  (1.753 m)   Body mass index is 39.49 kg/m.  Physical Exam Constitutional:      Appearance: Normal appearance. He is well-developed.  Eyes:     General: No scleral icterus.    Comments: OS corneal clouding; OD PERRL  Neck:     Musculoskeletal: Neck supple.     Thyroid: No thyromegaly.     Vascular: No carotid bruit.  Cardiovascular:     Rate and Rhythm: Normal rate and regular rhythm.     Heart sounds: Murmur (1/6 SEM) present. No friction rub. No gallop.   Pulmonary:     Effort: Pulmonary effort is normal.     Breath sounds: Normal breath sounds. No wheezing or rales.  Chest:     Chest wall: No tenderness.  Abdominal:     General: Bowel sounds are normal. There is no abdominal bruit.     Palpations: Abdomen is soft. Abdomen is not rigid. There is  no hepatomegaly or pulsatile mass.  Skin:    General: Skin is warm and dry.     Findings: No rash.  Neurological:     Mental Status: He is alert and oriented to person, place, and time.  Psychiatric:        Behavior: Behavior normal.        Thought Content: Thought content  normal.        Judgment: Judgment normal.     Labs reviewed: Basic Metabolic Panel: Recent Labs    10/14/17 1040 11/07/17 1326 12/16/17 1120 03/22/18 0946  NA  --  134* 139 137  K  --  3.9 4.0 4.3  CL  --  102 107 107  CO2  --  18* 21* 21*  GLUCOSE  --  163* 130* 125*  BUN  --  14 15 13   CREATININE  --  0.89 0.99 0.88  CALCIUM  --  9.2 9.3 9.5  TSH 1.66  --   --   --    Liver Function Tests: Recent Labs    11/07/17 1326 12/16/17 1120 03/22/18 0946  AST 58* 35 27  ALT 73* 43 26  ALKPHOS 112 130* 130*  BILITOT 1.0 0.9 0.5  PROT 6.9 7.4 7.4  ALBUMIN 3.7 3.6 3.7   Recent Labs    11/07/17 1326  LIPASE 30   No results for input(s): AMMONIA in the last 8760 hours. CBC: Recent Labs    09/15/17 1041 11/07/17 1326 12/16/17 1120 03/22/18 0946  WBC 6.9 5.1 5.7 6.0  NEUTROABS 3.3  --  2.4 2.6  HGB 13.4 14.0 14.0 14.1  HCT 40.2 42.7 42.6 41.8  MCV 91.8 93.2 93.8 93.7  PLT 211 195 210 204   Lipid Panel: Recent Labs    10/14/17 1040  CHOL 209*  HDL 82  LDLCALC 108*  TRIG 92  CHOLHDL 2.5   TSH: Recent Labs    10/14/17 1040  TSH 1.66   A1C: Lab Results  Component Value Date   HGBA1C 7.0 (H) 10/14/2017     Assessment/Plan 1. Type 2 diabetes mellitus with diabetic mononeuropathy, without long-term current use of insulin (HCC) -diet controlled, did not tolerate metformin.  - COMPLETE METABOLIC PANEL WITH GFR; Future - Hemoglobin A1c; Future - CBC with Differential/Platelet; Future  2. Mixed hyperlipidemia -continues on Lipitor with diet modifications.  - COMPLETE METABOLIC PANEL WITH GFR; Future - Lipid Panel; Future  3. Constipation, unspecified constipation  type Stable with dietary modifications.   4. Prostate cancer ([place]) -stable following with urologist.   5. Screening for colorectal cancer - Ambulatory referral to Gastroenterology  6. Need for hepatitis C screening test - Hepatitis C antibody; Future  Next appt: to follow up fasting labs this week, 6 months for routine follow up.  [name]. [place], [facility] [phone]

## 2018-04-10 NOTE — Patient Instructions (Addendum)
Please schedule fasting labs for later this week  Follow up in 6 months for routine follow up, we will get labs before appt    DASH Eating Plan DASH stands for "Dietary Approaches to Stop Hypertension." The DASH eating plan is a healthy eating plan that has been shown to reduce high blood pressure (hypertension). It may also reduce your risk for type 2 diabetes, heart disease, and stroke. The DASH eating plan may also help with weight loss. What are tips for following this plan?  General guidelines  Avoid eating more than 2,300 mg (milligrams) of salt (sodium) a day. If you have hypertension, you may need to reduce your sodium intake to 1,500 mg a day.  Limit alcohol intake to no more than 1 drink a day for nonpregnant women and 2 drinks a day for men. One drink equals 12 oz of beer, 5 oz of wine, or 1 oz of hard liquor.  Work with your health care provider to maintain a healthy body weight or to lose weight. Ask what an ideal weight is for you.  Get at least 30 minutes of exercise that causes your heart to beat faster (aerobic exercise) most days of the week. Activities may include walking, swimming, or biking.  Work with your health care provider or diet and nutrition specialist (dietitian) to adjust your eating plan to your individual calorie needs. Reading food labels   Check food labels for the amount of sodium per serving. Choose foods with less than 5 percent of the Daily Value of sodium. Generally, foods with less than 300 mg of sodium per serving fit into this eating plan.  To find whole grains, look for the word "whole" as the first word in the ingredient list. Shopping  Buy products labeled as "low-sodium" or "no salt added."  Buy fresh foods. Avoid canned foods and premade or frozen meals. Cooking  Avoid adding salt when cooking. Use salt-free seasonings or herbs instead of table salt or sea salt. Check with your health care provider or pharmacist before using salt  substitutes.  Do not fry foods. Cook foods using healthy methods such as baking, boiling, grilling, and broiling instead.  Cook with heart-healthy oils, such as olive, canola, soybean, or sunflower oil. Meal planning  Eat a balanced diet that includes: ? 5 or more servings of fruits and vegetables each day. At each meal, try to fill half of your plate with fruits and vegetables. ? Up to 6-8 servings of whole grains each day. ? Less than 6 oz of lean meat, poultry, or fish each day. A 3-oz serving of meat is about the same size as a deck of cards. One egg equals 1 oz. ? 2 servings of low-fat dairy each day. ? A serving of nuts, seeds, or beans 5 times each week. ? Heart-healthy fats. Healthy fats called Omega-3 fatty acids are found in foods such as flaxseeds and coldwater fish, like sardines, salmon, and mackerel.  Limit how much you eat of the following: ? Canned or prepackaged foods. ? Food that is high in trans fat, such as fried foods. ? Food that is high in saturated fat, such as fatty meat. ? Sweets, desserts, sugary drinks, and other foods with added sugar. ? Full-fat dairy products.  Do not salt foods before eating.  Try to eat at least 2 vegetarian meals each week.  Eat more home-cooked food and less restaurant, buffet, and fast food.  When eating at a restaurant, ask that your food be  prepared with less salt or no salt, if possible. What foods are recommended? The items listed may not be a complete list. Talk with your dietitian about what dietary choices are best for you. Grains Whole-grain or whole-wheat bread. Whole-grain or whole-wheat pasta. Bitton rice. Modena Morrow. Bulgur. Whole-grain and low-sodium cereals. Pita bread. Low-fat, low-sodium crackers. Whole-wheat flour tortillas. Vegetables Fresh or frozen vegetables (raw, steamed, roasted, or grilled). Low-sodium or reduced-sodium tomato and vegetable juice. Low-sodium or reduced-sodium tomato sauce and tomato  paste. Low-sodium or reduced-sodium canned vegetables. Fruits All fresh, dried, or frozen fruit. Canned fruit in natural juice (without added sugar). Meat and other protein foods Skinless chicken or [place]. Ground chicken or [place]. Pork with fat trimmed off. Fish and seafood. Egg whites. Dried beans, peas, or lentils. Unsalted nuts, nut butters, and seeds. Unsalted canned beans. Lean cuts of beef with fat trimmed off. Low-sodium, lean deli meat. Dairy Low-fat (1%) or fat-free (skim) milk. Fat-free, low-fat, or reduced-fat cheeses. Nonfat, low-sodium ricotta or cottage cheese. Low-fat or nonfat yogurt. Low-fat, low-sodium cheese. Fats and oils Soft margarine without trans fats. Vegetable oil. Low-fat, reduced-fat, or light mayonnaise and salad dressings (reduced-sodium). Canola, safflower, olive, soybean, and sunflower oils. Avocado. Seasoning and other foods Herbs. Spices. Seasoning mixes without salt. Unsalted popcorn and pretzels. Fat-free sweets. What foods are not recommended? The items listed may not be a complete list. Talk with your dietitian about what dietary choices are best for you. Grains Baked goods made with fat, such as croissants, muffins, or some breads. Dry pasta or rice meal packs. Vegetables Creamed or fried vegetables. Vegetables in a cheese sauce. Regular canned vegetables (not low-sodium or reduced-sodium). Regular canned tomato sauce and paste (not low-sodium or reduced-sodium). Regular tomato and vegetable juice (not low-sodium or reduced-sodium). Angie Fava. Olives. Fruits Canned fruit in a light or heavy syrup. Fried fruit. Fruit in cream or butter sauce. Meat and other protein foods Fatty cuts of meat. Ribs. Fried meat. Berniece Salines. Sausage. Bologna and other processed lunch meats. Salami. Fatback. Hotdogs. Bratwurst. Salted nuts and seeds. Canned beans with added salt. Canned or smoked fish. Whole eggs or egg yolks. Chicken or [place] with skin. Dairy Whole or 2% milk,  cream, and half-and-half. Whole or full-fat cream cheese. Whole-fat or sweetened yogurt. Full-fat cheese. Nondairy creamers. Whipped toppings. Processed cheese and cheese spreads. Fats and oils Butter. Stick margarine. Lard. Shortening. Ghee. Bacon fat. Tropical oils, such as coconut, palm kernel, or palm oil. Seasoning and other foods Salted popcorn and pretzels. Onion salt, garlic salt, seasoned salt, table salt, and sea salt. Worcestershire sauce. Tartar sauce. Barbecue sauce. Teriyaki sauce. Soy sauce, including reduced-sodium. Steak sauce. Canned and packaged gravies. Fish sauce. Oyster sauce. Cocktail sauce. Horseradish that you find on the shelf. Ketchup. Mustard. Meat flavorings and tenderizers. Bouillon cubes. Hot sauce and Tabasco sauce. Premade or packaged marinades. Premade or packaged taco seasonings. Relishes. Regular salad dressings. Where to find more information:  National Heart, Lung, and [place]: https://wilson-eaton.com/  [name] Heart Association: www.heart.org Summary  The DASH eating plan is a healthy eating plan that has been shown to reduce high blood pressure (hypertension). It may also reduce your risk for type 2 diabetes, heart disease, and stroke.  With the DASH eating plan, you should limit salt (sodium) intake to 2,300 mg a day. If you have hypertension, you may need to reduce your sodium intake to 1,500 mg a day.  When on the DASH eating plan, aim to eat more fresh fruits and vegetables, whole grains,  lean proteins, low-fat dairy, and heart-healthy fats.  Work with your health care provider or diet and nutrition specialist (dietitian) to adjust your eating plan to your individual calorie needs. This information is not intended to replace advice given to you by your health care provider. Make sure you discuss any questions you have with your health care provider. Document Released: 01/21/2011 Document Revised: 01/26/2016 Document Reviewed: 01/26/2016 Elsevier  Interactive Patient Education  2019 [name].

## 2018-04-11 ENCOUNTER — Other Ambulatory Visit: Payer: Medicare HMO

## 2018-04-13 ENCOUNTER — Encounter: Payer: Self-pay | Admitting: Nurse Practitioner

## 2018-04-13 MED FILL — XTANDI 40 MG CAPSULE: 40 | 30 days supply | Qty: 120 | Fill #1

## 2018-04-14 ENCOUNTER — Other Ambulatory Visit: Payer: Medicare HMO

## 2018-04-14 DIAGNOSIS — Z1159 Encounter for screening for other viral diseases: Secondary | ICD-10-CM

## 2018-04-14 DIAGNOSIS — E782 Mixed hyperlipidemia: Secondary | ICD-10-CM

## 2018-04-14 DIAGNOSIS — E1141 Type 2 diabetes mellitus with diabetic mononeuropathy: Secondary | ICD-10-CM | POA: Diagnosis not present

## 2018-04-15 IMAGING — CT CT PELVIS W/ CM
2 of 3 series · 17 of 46 positions shown, 19 images · IV contrast (iopamidol)
Comparison: None.

CLINICAL DATA: Prostate carcinoma. Previous radiation therapy.
Rising serum PSA level, consistent with biochemical recurrence.

EXAM:
CT PELVIS WITH CONTRAST
TECHNIQUE: Multidetector CT imaging of the pelvis was performed using the
standard protocol following the bolus administration of intravenous
contrast.
CONTRAST:  100mL 1O865W-[id number] IOPAMIDOL (1O865W-[id number]) INJECTION 61%

[Series 2: pelvis st · axial · 0.79mm/px · z∈[-699,-429]mm · 14 of 62 slices shown, 16 images]
[im 4/62  soft-tissue]
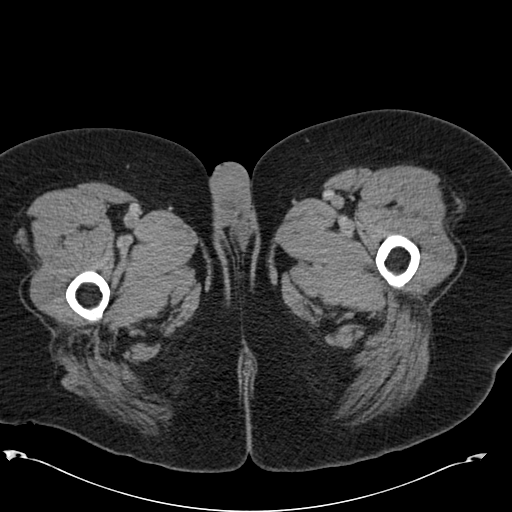
[im 4/62  bone]
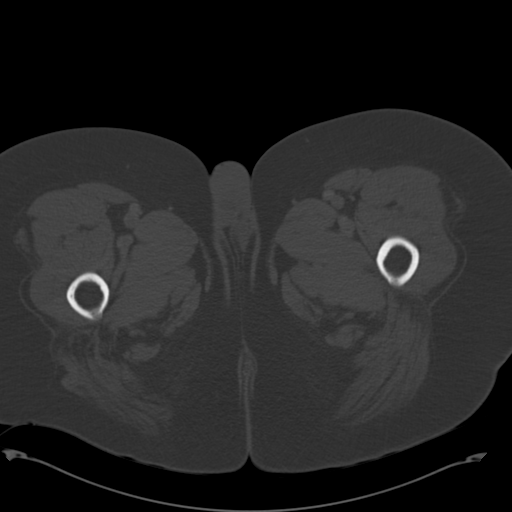
[im 8/62  soft-tissue]
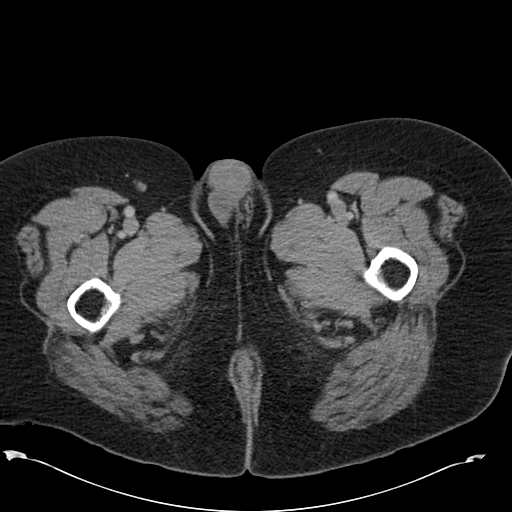
[im 12/62  soft-tissue]
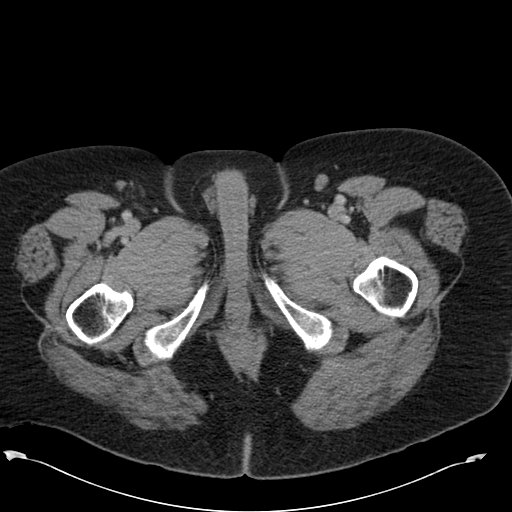
[im 16/62  soft-tissue]
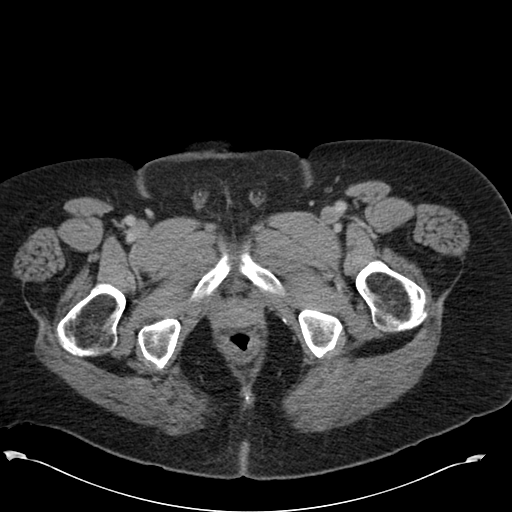
[im 20/62  soft-tissue]
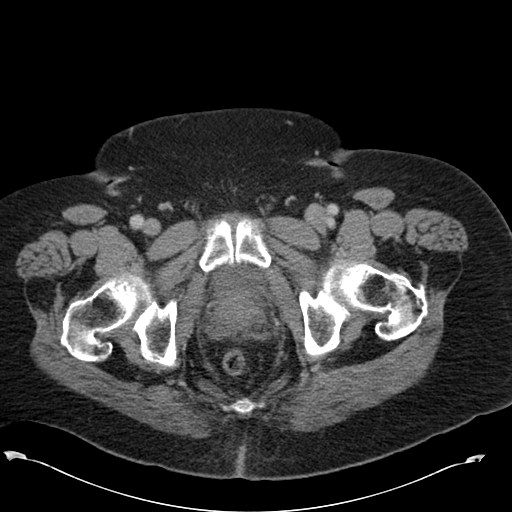
[im 24/62  soft-tissue]
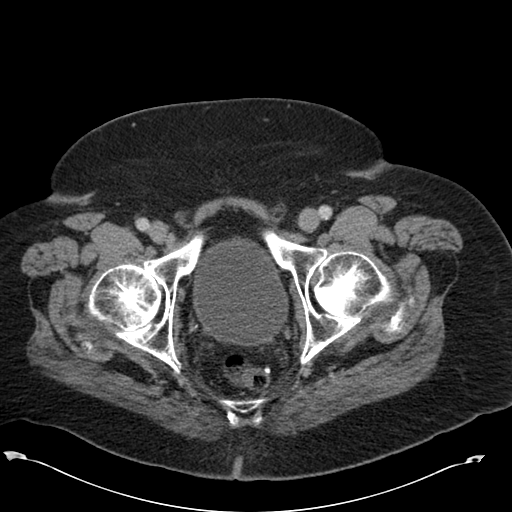
[im 28/62  soft-tissue]
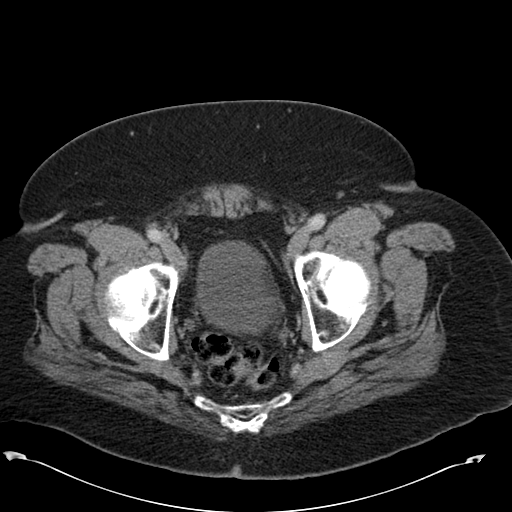
[im 34/62  soft-tissue]
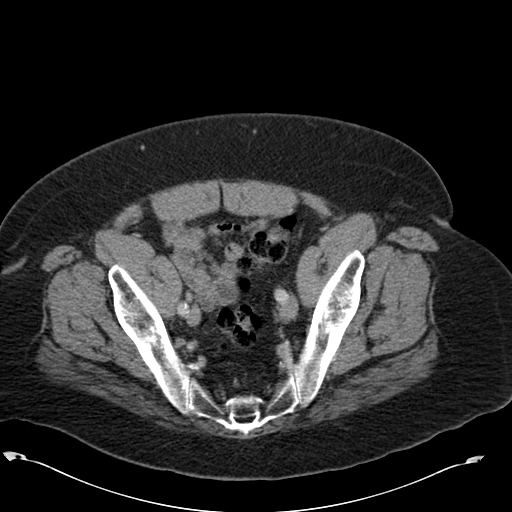
[im 38/62  soft-tissue]
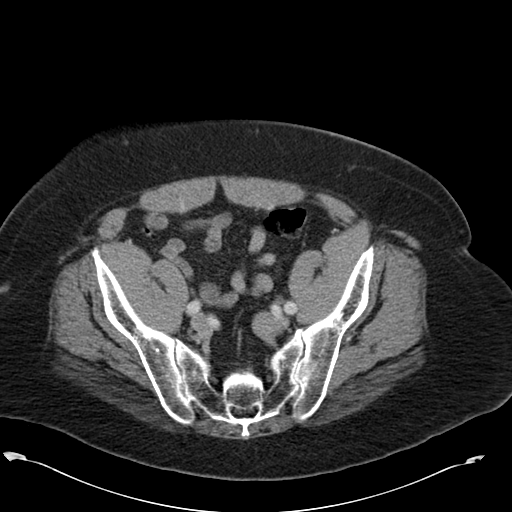
[im 38/62  bone]
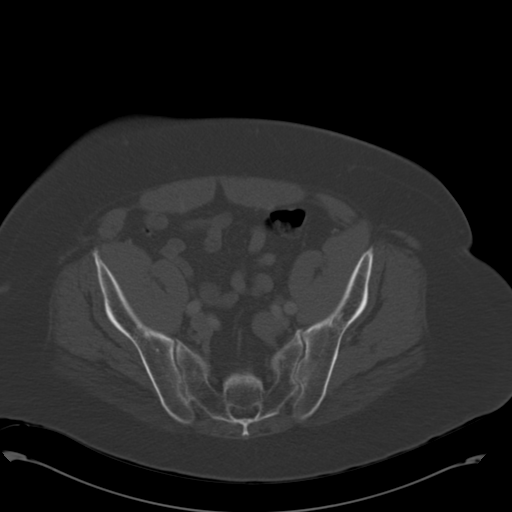
[im 42/62  soft-tissue]
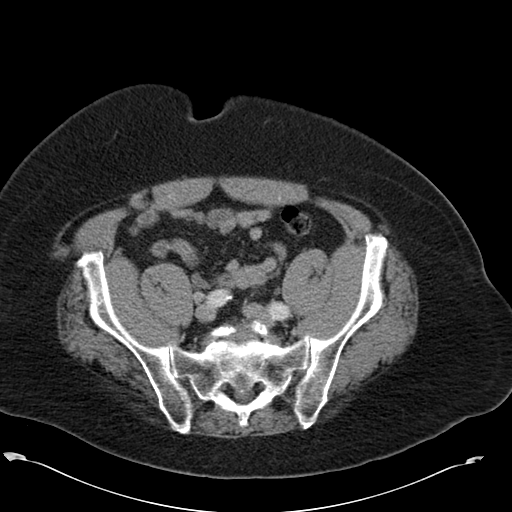
[im 46/62  soft-tissue]
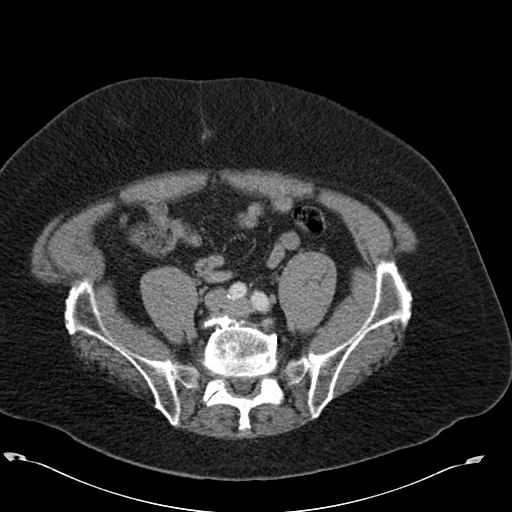
[im 50/62  soft-tissue]
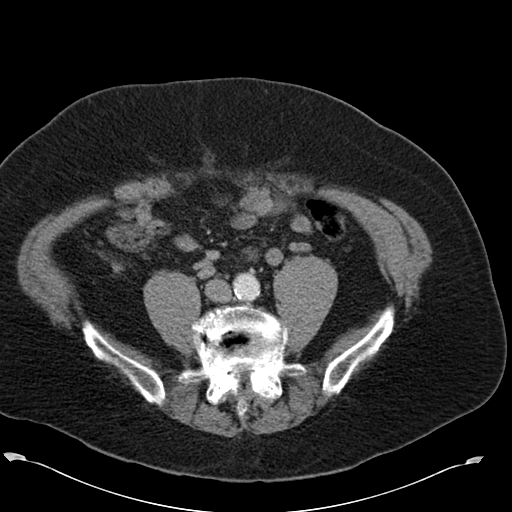
[im 54/62  soft-tissue]
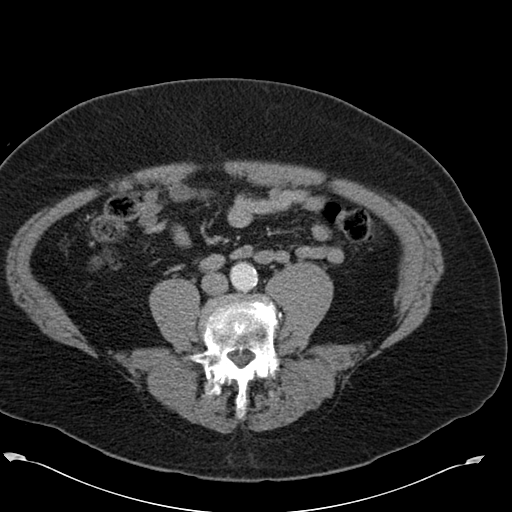
[im 58/62  soft-tissue]
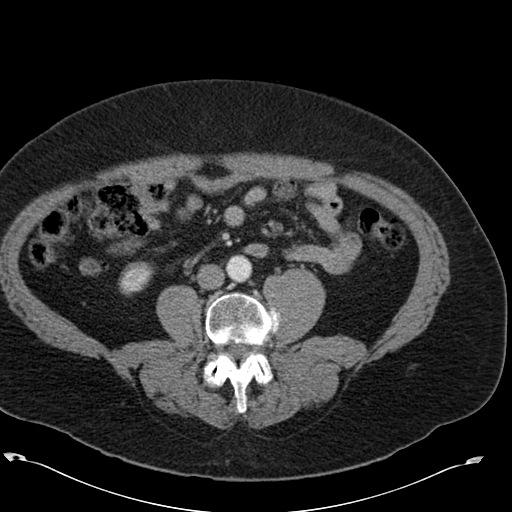

[Series 603: <mpr range(1)> · coronal · 0.79mm/px · 3 of 146 slices shown]
[im 49/146  soft-tissue]
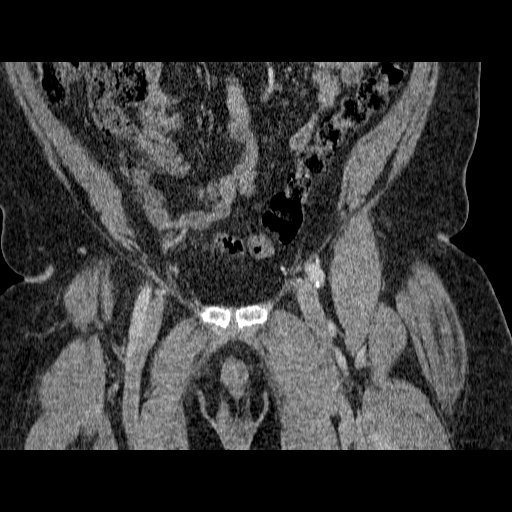
[im 65/146  soft-tissue]
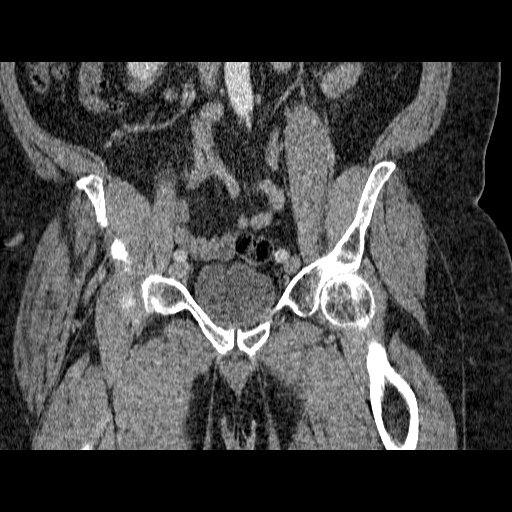
[im 81/146  soft-tissue]
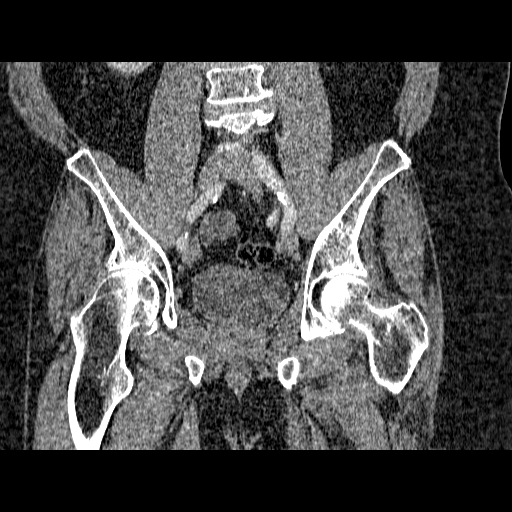

[17 of 46 positions shown; findings below may reference images not displayed]

FINDINGS: Urinary Tract: Unremarkable urinary bladder.

Bowel: Unremarkable pelvic bowel loops.

Vascular/Lymphatic: No pathologically enlarged lymph nodes. Distal
abdominal aortic and iliac artery atherosclerotic calcification
noted.

Reproductive: Small prostate gland and seminal vesicles. No mass
identified.

Other: None.

Musculoskeletal: No significant abnormality identfied. Advanced
degenerative spondylosis seen at L4-5 and L5-S1.
IMPRESSION: No evidence of locally recurrent or metastatic prostate carcinoma
within the pelvis.

Aortic and bilateral iliac artery atherosclerosis.

## 2018-04-17 LAB — COMPLETE METABOLIC PANEL WITH GFR
AG RATIO: 1.3 (calc) (ref 1.0–2.5)
ALBUMIN MSPROF: 3.9 g/dL (ref 3.6–5.1)
ALT: 17 U/L (ref 9–46)
AST: 20 U/L (ref 10–35)
Alkaline phosphatase (APISO): 107 U/L (ref 35–144)
BUN: 15 mg/dL (ref 7–25)
CO2: 21 mmol/L (ref 20–32)
Calcium: 9.5 mg/dL (ref 8.6–10.3)
Chloride: 105 mmol/L (ref 98–110)
Creat: 0.8 mg/dL (ref 0.70–1.18)
GFR, Est African American: 102 mL/min/{1.73_m2} (ref >=60)
GFR, Est Non African American: 88 mL/min/{1.73_m2} (ref >=60)
Globulin: 3 g/dL (calc) (ref 1.9–3.7)
Glucose, Bld: 140 mg/dL — ABNORMAL HIGH (ref 65–99)
POTASSIUM: 4.3 mmol/L (ref 3.5–5.3)
Sodium: 136 mmol/L (ref 135–146)
TOTAL PROTEIN: 6.9 g/dL (ref 6.1–8.1)
Total Bilirubin: 0.6 mg/dL (ref 0.2–1.2)

## 2018-04-17 LAB — CBC WITH DIFFERENTIAL/PLATELET
ABSOLUTE MONOCYTES: 622 {cells}/uL (ref 200–950)
BASOS PCT: 0.7 %
Basophils Absolute: 43 cells/uL (ref 0–200)
EOS ABS: 140 {cells}/uL (ref 15–500)
Eosinophils Relative: 2.3 %
HEMATOCRIT: 39.9 % (ref 38.5–50.0)
Hemoglobin: 14.2 g/dL (ref 13.2–17.1)
LYMPHS ABS: 2403 {cells}/uL (ref 850–3900)
MCH: 32.4 pg (ref 27.0–33.0)
MCHC: 35.6 g/dL (ref 32.0–36.0)
MCV: 91.1 fL (ref 80.0–100.0)
MPV: 11 fL (ref 7.5–12.5)
Monocytes Relative: 10.2 %
NEUTROS ABS: 2891 {cells}/uL (ref 1500–7800)
NEUTROS PCT: 47.4 %
Platelets: 251 10*3/uL (ref 140–400)
RBC: 4.38 10*6/uL (ref 4.20–5.80)
RDW: 12.4 % (ref 11.0–15.0)
Total Lymphocyte: 39.4 %
WBC: 6.1 10*3/uL (ref 3.8–10.8)

## 2018-04-17 LAB — LIPID PANEL
CHOL/HDL RATIO: 2.3 (calc) (ref <5.0)
Cholesterol: 183 mg/dL (ref <200)
HDL: 80 mg/dL (ref >=40)
LDL CHOLESTEROL (CALC): 87 mg/dL
Non-HDL Cholesterol (Calc): 103 mg/dL (calc) (ref <130)
TRIGLYCERIDES: 71 mg/dL (ref <150)

## 2018-04-17 LAB — HEMOGLOBIN A1C
Hgb A1c MFr Bld: 6.8 % of total Hgb — ABNORMAL HIGH (ref <5.7)
Mean Plasma Glucose: 148 (calc)
eAG (mmol/L): 8.2 (calc)

## 2018-04-17 LAB — HEPATITIS C ANTIBODY
Hepatitis C Ab: NONREACTIVE
SIGNAL TO CUT-OFF: 0.07 (ref <1.00)

## 2018-05-02 ENCOUNTER — Other Ambulatory Visit: Payer: Self-pay | Admitting: Oncology

## 2018-05-02 DIAGNOSIS — C61 Malignant neoplasm of prostate: Secondary | ICD-10-CM

## 2018-05-08 MED FILL — XTANDI 40 MG CAPSULE: 40 | 30 days supply | Qty: 120 | Fill #0

## 2018-05-16 ENCOUNTER — Other Ambulatory Visit: Payer: Self-pay | Admitting: *Deleted

## 2018-05-16 DIAGNOSIS — E782 Mixed hyperlipidemia: Secondary | ICD-10-CM

## 2018-05-16 MED ORDER — ATORVASTATIN CALCIUM 10 MG PO TABS: 10.0000 mg | ORAL_TABLET | Freq: Every morning | ORAL | 1 refills | Status: DC

## 2018-05-16 NOTE — Telephone Encounter (Signed)
Patient requested refill

## 2018-05-23 ENCOUNTER — Telehealth: Payer: Self-pay | Admitting: Pharmacist

## 2018-05-23 NOTE — Telephone Encounter (Signed)
Oral Chemotherapy Pharmacist Encounter   Spoke with patient today to follow up regarding patient's oral chemotherapy medication: Xtandi (enzalutamide) for the treatment of recurrent, castration-resistant prostate cancer in conjunction with androgen deprivation therapy (Trelstar administered Q6 months at [facility], last administration 10/19/17), panned duration until disease progression or unacceptable toxicity.  Original Start date of oral chemotherapy: 07/04/2016  Pt is doing well today  Pt reports 0 tablets/doses of Xtandi 40mg  capsules, 4 capsules (160mg ) taken by mouth once daily, without regard to food, missed in the last month. Patient takes his Xtandi at noon every day.  Pt reports the following side effects: none to report due to [facility] or androgen deprivation. Patient states his energy level is adequate.  Pertinent labs reviewed: [place] for continued treatment.  Confirmed office visits on 6/4 and 6/9.  Patient knows to call the office with questions or concerns. Oral Oncology Clinic will continue to follow.  Johny Drilling, PharmD, BCPS, BCOP  05/23/2018 9:43 AM Oral Oncology Clinic [phone]

## 2018-06-12 DIAGNOSIS — C61 Malignant neoplasm of prostate: Secondary | ICD-10-CM | POA: Diagnosis not present

## 2018-06-12 DIAGNOSIS — R3912 Poor urinary stream: Secondary | ICD-10-CM | POA: Diagnosis not present

## 2018-06-12 DIAGNOSIS — R390 Extravasation of urine: Secondary | ICD-10-CM | POA: Diagnosis not present

## 2018-06-14 DIAGNOSIS — C61 Malignant neoplasm of prostate: Secondary | ICD-10-CM | POA: Diagnosis not present

## 2018-06-14 DIAGNOSIS — Z5111 Encounter for antineoplastic chemotherapy: Secondary | ICD-10-CM | POA: Diagnosis not present

## 2018-06-15 MED FILL — XTANDI 40 MG CAPSULE: 40 | 30 days supply | Qty: 120 | Fill #1

## 2018-07-07 ENCOUNTER — Other Ambulatory Visit: Payer: Self-pay | Admitting: Oncology

## 2018-07-07 DIAGNOSIS — C61 Malignant neoplasm of prostate: Secondary | ICD-10-CM

## 2018-07-13 MED FILL — XTANDI 40 MG CAPSULE: 40 | 30 days supply | Qty: 120 | Fill #0

## 2018-07-21 ENCOUNTER — Inpatient Hospital Stay: Payer: Medicare HMO | Attending: Oncology

## 2018-07-21 ENCOUNTER — Other Ambulatory Visit: Payer: Self-pay

## 2018-07-21 DIAGNOSIS — R9721 Rising PSA following treatment for malignant neoplasm of prostate: Secondary | ICD-10-CM | POA: Insufficient documentation

## 2018-07-21 DIAGNOSIS — Z79899 Other long term (current) drug therapy: Secondary | ICD-10-CM | POA: Diagnosis not present

## 2018-07-21 DIAGNOSIS — Z192 Hormone resistant malignancy status: Secondary | ICD-10-CM | POA: Diagnosis not present

## 2018-07-21 DIAGNOSIS — C61 Malignant neoplasm of prostate: Secondary | ICD-10-CM | POA: Insufficient documentation

## 2018-07-21 DIAGNOSIS — Z7982 Long term (current) use of aspirin: Secondary | ICD-10-CM | POA: Diagnosis not present

## 2018-07-21 LAB — CMP (CANCER CENTER ONLY)
ALT: 16 U/L (ref 0–44)
AST: 21 U/L (ref 15–41)
Albumin: 3.8 g/dL (ref 3.5–5.0)
Alkaline Phosphatase: 110 U/L (ref 38–126)
Anion gap: 14 (ref 5–15)
BUN: 14 mg/dL (ref 8–23)
CO2: 19 mmol/L — ABNORMAL LOW (ref 22–32)
Calcium: 9 mg/dL (ref 8.9–10.3)
Chloride: 107 mmol/L (ref 98–111)
Creatinine: 1.12 mg/dL (ref 0.61–1.24)
GFR, Est AFR Am: >60 mL/min (ref >60)
GFR, Estimated: >60 mL/min (ref >60)
Glucose, Bld: 122 mg/dL — ABNORMAL HIGH (ref 70–99)
Potassium: 4.1 mmol/L (ref 3.5–5.1)
Sodium: 140 mmol/L (ref 135–145)
Total Bilirubin: 0.3 mg/dL (ref 0.3–1.2)
Total Protein: 7.3 g/dL (ref 6.5–8.1)

## 2018-07-21 LAB — CBC WITH DIFFERENTIAL (CANCER CENTER ONLY)
Abs Immature Granulocytes: 0.02 10*3/uL (ref 0.00–0.07)
Basophils Absolute: 0.1 10*3/uL (ref 0.0–0.1)
Basophils Relative: 1 %
Eosinophils Absolute: 0.1 10*3/uL (ref 0.0–0.5)
Eosinophils Relative: 1 %
HCT: 42 % (ref 39.0–52.0)
Hemoglobin: 13.9 g/dL (ref 13.0–17.0)
Immature Granulocytes: 0 %
Lymphocytes Relative: 48 %
Lymphs Abs: 3.3 10*3/uL (ref 0.7–4.0)
MCH: 31.9 pg (ref 26.0–34.0)
MCHC: 33.1 g/dL (ref 30.0–36.0)
MCV: 96.3 fL (ref 80.0–100.0)
Monocytes Absolute: 0.6 10*3/uL (ref 0.1–1.0)
Monocytes Relative: 9 %
Neutro Abs: 2.8 10*3/uL (ref 1.7–7.7)
Neutrophils Relative %: 41 %
Platelet Count: 225 10*3/uL (ref 150–400)
RBC: 4.36 MIL/uL (ref 4.22–5.81)
RDW: 13.1 % (ref 11.5–15.5)
WBC Count: 6.8 10*3/uL (ref 4.0–10.5)
nRBC: 0 % (ref 0.0–0.2)

## 2018-07-22 LAB — PROSTATE-SPECIFIC AG, SERUM (LABCORP): Prostate Specific Ag, Serum: 3 ng/mL (ref 0.0–4.0)

## 2018-07-25 ENCOUNTER — Other Ambulatory Visit: Payer: Self-pay

## 2018-07-25 ENCOUNTER — Inpatient Hospital Stay (HOSPITAL_BASED_OUTPATIENT_CLINIC_OR_DEPARTMENT_OTHER): Payer: Medicare HMO | Admitting: Oncology

## 2018-07-25 VITALS — BP 148/83 | HR 91 | Temp 98.3°F | Resp 18 | Ht 69.0 in | Wt 260.0 lb

## 2018-07-25 DIAGNOSIS — C61 Malignant neoplasm of prostate: Secondary | ICD-10-CM | POA: Diagnosis not present

## 2018-07-25 DIAGNOSIS — R9721 Rising PSA following treatment for malignant neoplasm of prostate: Secondary | ICD-10-CM

## 2018-07-25 DIAGNOSIS — Z79899 Other long term (current) drug therapy: Secondary | ICD-10-CM

## 2018-07-25 DIAGNOSIS — Z7982 Long term (current) use of aspirin: Secondary | ICD-10-CM | POA: Diagnosis not present

## 2018-07-25 DIAGNOSIS — Z192 Hormone resistant malignancy status: Secondary | ICD-10-CM | POA: Diagnosis not present

## 2018-07-25 NOTE — Progress Notes (Signed)
Hematology and Oncology Follow Up Visit  Samuel [name] [id number] 1945-05-13 73 y.o. 07/25/2018 1:17 PM [name], NPEubanks, [name], NP   Principle Diagnosis: 73 year old man with prostate cancer and biochemical relapse diagnosed in 2017.  He has castration-resistant after being diagnosed with Gleason score 4+4 = 8 and a PSA of 8.97 in 2010.    Prior Therapy: He was treated with external beam radiation and androgen deprivation.  He developed biochemical relapse with a PSA rise and he has been on combined androgen deprivation with [name] agonist on bicalutamide. PSA in June 2016 was 6.11, in December 2016 was 10, in June 2017 was 9 and in September 2017 was up to 12.  Staging workup including CT scan of the abdomen and pelvis as well as bone scan showed no evidence of metastatic disease.  His PSA was up to 22.5 in April 2018.   Current therapy:   Androgen Deprivation given under the care of Dr. Junious Silk [facility].  Xtandi 160 mg daily started in May 2018.  Interim History: Mr. Butt is here for a repeat evaluation.  Since last visit, he reports no major changes in his health.  He continues to tolerate Xtandi without any recent occasions.  He denies excessive fatigue, tiredness or edema.  He denies any hospitalization or illnesses.  Denies any bone pain or pathological fractures.  He denied headaches, blurry vision, syncope or seizures.  Denies any fevers, chills or sweats.  Denied chest pain, palpitation, orthopnea or leg edema.  Denied cough, wheezing or hemoptysis.  Denied nausea, vomiting or abdominal pain.  Denies any constipation or diarrhea.  Denies any frequency urgency or hesitancy.  Denies any arthralgias or myalgias.  Denies any skin rashes or lesions.  Denies any bleeding or clotting tendency.  Denies any easy bruising.  Denies any hair or nail changes.  Denies any anxiety or depression.  Remaining review of system is negative.          Medications: I have  reviewed the patient's current medications.  Current Outpatient Medications  Medication Sig Dispense Refill  . aspirin 325 MG tablet Take 325 mg by mouth daily.    Marland Kitchen atorvastatin (LIPITOR) 10 MG tablet Take 1 tablet (10 mg total) by mouth every morning. 90 tablet 1  . triptorelin (TRELSTAR) 11.25 MG injection Inject 11.25 mg into the muscle. Every 6 months    . XTANDI 40 MG capsule TAKE 4 CAPSULES (160MG ) BY MOUTH ONCE DAILY. 120 capsule 1   No current facility-administered medications for this visit.      Allergies:  Allergies  Allergen Reactions  . Other Other (See Comments)    Patient stated,"if I touch felt (material) I get "knots" on my body."  . Peach [Prunus Persica] Other (See Comments)    Patient stated,"if I touch or eat peach fuzz, I get "knots" all over my face and mouth."     Past Medical History, Surgical history, Social history, and Family History without any changes on review.   Physical Exam:  Blood pressure (!) 148/83, pulse 91, temperature 98.3 F (36.8 C), temperature source Oral, resp. rate 18, height 5\' 9"  (1.753 m), weight 260 lb (117.9 kg), SpO2 98 %.     ECOG: 0    General appearance: Comfortable appearing without any discomfort Head: Normocephalic without any trauma Oropharynx: Mucous membranes are moist and pink without any thrush or ulcers. Eyes: Pupils are equal and round reactive to light. Lymph nodes: No cervical, supraclavicular, inguinal or axillary lymphadenopathy.  Heart:regular rate and rhythm.  S1 and S2 without leg edema. Lung: Clear without any rhonchi or wheezes.  No dullness to percussion. Abdomin: Soft, nontender, nondistended with good bowel sounds.  No hepatosplenomegaly. Musculoskeletal: No joint deformity or effusion.  Full range of motion noted. Neurological: No deficits noted on motor, sensory and deep tendon reflex exam. Skin: No petechial rash or dryness.  Appeared moist.        Lab Results: Lab Results   Component Value Date   WBC 6.8 07/21/2018   HGB 13.9 07/21/2018   HCT 42.0 07/21/2018   MCV 96.3 07/21/2018   PLT 225 07/21/2018     Chemistry      Component Value Date/Time   NA 140 07/21/2018 1413   NA 137 12/15/2016 1421   K 4.1 07/21/2018 1413   K 4.4 12/15/2016 1421   CL 107 07/21/2018 1413   CO2 19 (L) 07/21/2018 1413   CO2 19 (L) 12/15/2016 1421   BUN 14 07/21/2018 1413   BUN 13.4 12/15/2016 1421   CREATININE 1.12 07/21/2018 1413   CREATININE 0.80 04/14/2018 0822   CREATININE 0.8 12/15/2016 1421      Component Value Date/Time   CALCIUM 9.0 07/21/2018 1413   CALCIUM 9.4 12/15/2016 1421   ALKPHOS 110 07/21/2018 1413   ALKPHOS 92 12/15/2016 1421   AST 21 07/21/2018 1413   AST 17 12/15/2016 1421   ALT 16 07/21/2018 1413   ALT 12 12/15/2016 1421   BILITOT 0.3 07/21/2018 1413   BILITOT 0.37 12/15/2016 1421     Results for ELWARD, NOCERA (MRN [id number]) as of 07/25/2018 13:18  Ref. Range 03/22/2018 09:46 07/21/2018 14:13  Prostate Specific Ag, Serum Latest Ref Range: 0.0 - 4.0 ng/mL 3.3 3.0     Impression and Plan:   73 year old man with  1.  Prostate cancer with biochemical relapse that is currently castration-sensitive without any evidence of measurable disease since 2017.   He continues to tolerate Xtandi without any recent complaints.  His PSA continues to be under control currently at 3.0 which is unchanged in the last year.  Risks and benefits of continuing this therapy long-term was reviewed.  Potential long-term conditions were also reiterated.  These would include hypertension, seizures and hematuria.    2. Androgen depravation: He continues to receive that under the care of Dr. Junious Silk.  I recommend continues indefinitely.   3. Follow-up: In 4 months for repeat evaluation.  15  minutes was spent with the patient face-to-face today.  More than 50% of time was dedicated to discussing his disease status, laboratory data review and answering questions  regarding future plan of care.  Zola Button, MD 6/9/20201:17 PM

## 2018-07-27 ENCOUNTER — Telehealth: Payer: Self-pay | Admitting: Oncology

## 2018-07-27 NOTE — Telephone Encounter (Signed)
I left a message regarding schedule will mail 

## 2018-08-14 MED FILL — XTANDI 40 MG CAPSULE: 40 | 30 days supply | Qty: 120 | Fill #1

## 2018-09-05 ENCOUNTER — Telehealth: Payer: Self-pay

## 2018-09-05 ENCOUNTER — Other Ambulatory Visit: Payer: Self-pay | Admitting: Oncology

## 2018-09-05 DIAGNOSIS — C61 Malignant neoplasm of prostate: Secondary | ICD-10-CM

## 2018-09-05 NOTE — Telephone Encounter (Signed)
Incoming call received by [employer] [employer] and safety inspector). Per Suzette patient called requesting a handicap placard.   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   Demographic portion of the disability parking placard was completed and placed in [name], NP review and sign folder.  Side Note- Patient would like to pick up when complete

## 2018-09-06 NOTE — Telephone Encounter (Signed)
completed

## 2018-09-06 NOTE — Telephone Encounter (Addendum)
Patient aware placard is available for pick-up   Copy made and sent to scanning

## 2018-09-11 MED FILL — XTANDI 40 MG CAPSULE: 40 | 30 days supply | Qty: 120 | Fill #0

## 2018-10-09 ENCOUNTER — Ambulatory Visit (INDEPENDENT_AMBULATORY_CARE_PROVIDER_SITE_OTHER): Payer: Medicare HMO | Admitting: Nurse Practitioner

## 2018-10-09 ENCOUNTER — Encounter: Payer: Self-pay | Admitting: Nurse Practitioner

## 2018-10-09 ENCOUNTER — Other Ambulatory Visit: Payer: Self-pay

## 2018-10-09 VITALS — BP 134/90 | HR 68 | Temp 97.9°F | Ht 69.0 in | Wt 260.0 lb

## 2018-10-09 DIAGNOSIS — C61 Malignant neoplasm of prostate: Secondary | ICD-10-CM | POA: Diagnosis not present

## 2018-10-09 DIAGNOSIS — E782 Mixed hyperlipidemia: Secondary | ICD-10-CM

## 2018-10-09 DIAGNOSIS — Z6838 Body mass index (BMI) 38.0-38.9, adult: Secondary | ICD-10-CM | POA: Diagnosis not present

## 2018-10-09 DIAGNOSIS — E1141 Type 2 diabetes mellitus with diabetic mononeuropathy: Secondary | ICD-10-CM

## 2018-10-09 DIAGNOSIS — Z86010 Personal history of colonic polyps: Secondary | ICD-10-CM | POA: Diagnosis not present

## 2018-10-09 DIAGNOSIS — I10 Essential (primary) hypertension: Secondary | ICD-10-CM | POA: Diagnosis not present

## 2018-10-09 DIAGNOSIS — M1711 Unilateral primary osteoarthritis, right knee: Secondary | ICD-10-CM | POA: Diagnosis not present

## 2018-10-09 NOTE — Patient Instructions (Signed)
Goal BP <140/90  DASH Eating Plan DASH stands for "Dietary Approaches to Stop Hypertension." The DASH eating plan is a healthy eating plan that has been shown to reduce high blood pressure (hypertension). It may also reduce your risk for type 2 diabetes, heart disease, and stroke. The DASH eating plan may also help with weight loss. What are tips for following this plan?  General guidelines  Avoid eating more than 2,300 mg (milligrams) of salt (sodium) a day. If you have hypertension, you may need to reduce your sodium intake to 1,500 mg a day.  Limit alcohol intake to no more than 1 drink a day for nonpregnant women and 2 drinks a day for men. One drink equals 12 oz of beer, 5 oz of wine, or 1 oz of hard liquor.  Work with your health care provider to maintain a healthy body weight or to lose weight. Ask what an ideal weight is for you.  Get at least 30 minutes of exercise that causes your heart to beat faster (aerobic exercise) most days of the week. Activities may include walking, swimming, or biking.  Work with your health care provider or diet and nutrition specialist (dietitian) to adjust your eating plan to your individual calorie needs. Reading food labels   Check food labels for the amount of sodium per serving. Choose foods with less than 5 percent of the Daily Value of sodium. Generally, foods with less than 300 mg of sodium per serving fit into this eating plan.  To find whole grains, look for the word "whole" as the first word in the ingredient list. Shopping  Buy products labeled as "low-sodium" or "no salt added."  Buy fresh foods. Avoid canned foods and premade or frozen meals. Cooking  Avoid adding salt when cooking. Use salt-free seasonings or herbs instead of table salt or sea salt. Check with your health care provider or pharmacist before using salt substitutes.  Do not fry foods. Cook foods using healthy methods such as baking, boiling, grilling, and broiling  instead.  Cook with heart-healthy oils, such as olive, canola, soybean, or sunflower oil. Meal planning  Eat a balanced diet that includes: ? 5 or more servings of fruits and vegetables each day. At each meal, try to fill half of your plate with fruits and vegetables. ? Up to 6-8 servings of whole grains each day. ? Less than 6 oz of lean meat, poultry, or fish each day. A 3-oz serving of meat is about the same size as a deck of cards. One egg equals 1 oz. ? 2 servings of low-fat dairy each day. ? A serving of nuts, seeds, or beans 5 times each week. ? Heart-healthy fats. Healthy fats called Omega-3 fatty acids are found in foods such as flaxseeds and coldwater fish, like sardines, salmon, and mackerel.  Limit how much you eat of the following: ? Canned or prepackaged foods. ? Food that is high in trans fat, such as fried foods. ? Food that is high in saturated fat, such as fatty meat. ? Sweets, desserts, sugary drinks, and other foods with added sugar. ? Full-fat dairy products.  Do not salt foods before eating.  Try to eat at least 2 vegetarian meals each week.  Eat more home-cooked food and less restaurant, buffet, and fast food.  When eating at a restaurant, ask that your food be prepared with less salt or no salt, if possible. What foods are recommended? The items listed may not be a complete list. Talk  with your dietitian about what dietary choices are best for you. Grains Whole-grain or whole-wheat bread. Whole-grain or whole-wheat pasta. Brown rice. [place]row. Bulgur. Whole-grain and low-sodium cereals. Pita bread. Low-fat, low-sodium crackers. Whole-wheat flour tortillas. Vegetables Fresh or frozen vegetables (raw, steamed, roasted, or grilled). Low-sodium or reduced-sodium tomato and vegetable juice. Low-sodium or reduced-sodium tomato sauce and tomato paste. Low-sodium or reduced-sodium canned vegetables. Fruits All fresh, dried, or frozen fruit. Canned fruit in  natural juice (without added sugar). Meat and other protein foods Skinless chicken or [place]. Ground chicken or [place]. Pork with fat trimmed off. Fish and seafood. Egg whites. Dried beans, peas, or lentils. Unsalted nuts, nut butters, and seeds. Unsalted canned beans. Lean cuts of beef with fat trimmed off. Low-sodium, lean deli meat. Dairy Low-fat (1%) or fat-free (skim) milk. Fat-free, low-fat, or reduced-fat cheeses. Nonfat, low-sodium ricotta or cottage cheese. Low-fat or nonfat yogurt. Low-fat, low-sodium cheese. Fats and oils Soft margarine without trans fats. Vegetable oil. Low-fat, reduced-fat, or light mayonnaise and salad dressings (reduced-sodium). Canola, safflower, olive, soybean, and sunflower oils. Avocado. Seasoning and other foods Herbs. Spices. Seasoning mixes without salt. Unsalted popcorn and pretzels. Fat-free sweets. What foods are not recommended? The items listed may not be a complete list. Talk with your dietitian about what dietary choices are best for you. Grains Baked goods made with fat, such as croissants, muffins, or some breads. Dry pasta or rice meal packs. Vegetables Creamed or fried vegetables. Vegetables in a cheese sauce. Regular canned vegetables (not low-sodium or reduced-sodium). Regular canned tomato sauce and paste (not low-sodium or reduced-sodium). Regular tomato and vegetable juice (not low-sodium or reduced-sodium). Angie Fava. Olives. Fruits Canned fruit in a light or heavy syrup. Fried fruit. Fruit in cream or butter sauce. Meat and other protein foods Fatty cuts of meat. Ribs. Fried meat. Berniece Salines. Sausage. Bologna and other processed lunch meats. Salami. Fatback. Hotdogs. Bratwurst. Salted nuts and seeds. Canned beans with added salt. Canned or smoked fish. Whole eggs or egg yolks. Chicken or [place] with skin. Dairy Whole or 2% milk, cream, and half-and-half. Whole or full-fat cream cheese. Whole-fat or sweetened yogurt. Full-fat cheese. Nondairy  creamers. Whipped toppings. Processed cheese and cheese spreads. Fats and oils Butter. Stick margarine. Lard. Shortening. Ghee. Bacon fat. Tropical oils, such as coconut, palm kernel, or palm oil. Seasoning and other foods Salted popcorn and pretzels. Onion salt, garlic salt, seasoned salt, table salt, and sea salt. Worcestershire sauce. Tartar sauce. Barbecue sauce. Teriyaki sauce. Soy sauce, including reduced-sodium. Steak sauce. Canned and packaged gravies. Fish sauce. Oyster sauce. Cocktail sauce. Horseradish that you find on the shelf. Ketchup. Mustard. Meat flavorings and tenderizers. Bouillon cubes. Hot sauce and Tabasco sauce. Premade or packaged marinades. Premade or packaged taco seasonings. Relishes. Regular salad dressings. Where to find more information:  National Heart, Lung, and [place]: https://wilson-eaton.com/  [name] Heart Association: www.heart.org Summary  The DASH eating plan is a healthy eating plan that has been shown to reduce high blood pressure (hypertension). It may also reduce your risk for type 2 diabetes, heart disease, and stroke.  With the DASH eating plan, you should limit salt (sodium) intake to 2,300 mg a day. If you have hypertension, you may need to reduce your sodium intake to 1,500 mg a day.  When on the DASH eating plan, aim to eat more fresh fruits and vegetables, whole grains, lean proteins, low-fat dairy, and heart-healthy fats.  Work with your health care provider or diet and nutrition specialist (dietitian) to adjust your  eating plan to your individual calorie needs. This information is not intended to replace advice given to you by your health care provider. Make sure you discuss any questions you have with your health care provider. Document Released: 01/21/2011 Document Revised: 01/14/2017 Document Reviewed: 01/26/2016 Elsevier Patient Education  2020 [name].

## 2018-10-09 NOTE — Progress Notes (Signed)
Careteam: Patient Care Team: [name], NP as PCP - General (Geriatric Medicine) Festus Aloe, MD as Consulting Physician (Urology)  Advanced Directive information    Allergies  Allergen Reactions  . Other Other (See Comments)    Patient stated,"if I touch felt (material) I get "knots" on my body."  . Peach [Prunus Persica] Other (See Comments)    Patient stated,"if I touch or eat peach fuzz, I get "knots" all over my face and mouth."     Chief Complaint  Patient presents with  . Medical Management of Chronic Issues    Here for 81-month followup, no complaints.      HPI: Patient is a 73 y.o. male seen in the office today for routine follow up.   Prostate cancer, recurrent -  He is currently on Xtandi (May 2018) due to elevated PSA (up to 22.5 in April 2018). PSAhas now improved to 3.3. Followed byH/ODr Alen Blew. He was 1st dx in 2003 and was tx with external beam XRT. It recurred in 2009 and has been getting hormone injections q53mos currently. He has nocturia 3-4 times per night. He also sees urology Dr Junious Silk.  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 Diet controlled. Attempts low sugar/diabetic diet.  Attempts to stay current with eye exam but had to reschedule due to covid  Hyperlipidemia - on lipitor. LDL 87 also on ASA 81 mg   Exercising almost every day, a lot of walking but sometimes squats and situps  Hx Knee DJD - s/p b/l TKR. Followed by Ortho Dr Rhona Raider at [facility]. Able to walk well currently, no pain.   Last colonoscopy in 2015 in [place] per pt. cologuard in April 2017 neg. Last colonoscopy was told he had polyps.   Declines all vaccines despite education.   Review of Systems:  Review of Systems  Constitutional: Negative for chills, fever and weight loss.  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   Cardiovascular: Negative for chest pain, palpitations and leg swelling.  Gastrointestinal: Negative for abdominal pain, constipation, diarrhea and heartburn.  Genitourinary: Positive for frequency. Negative for dysuria and urgency.  Musculoskeletal: Negative for back pain, falls, joint pain and myalgias.  Skin: Negative.   Neurological: Negative for dizziness, tingling, weakness and headaches.  Psychiatric/Behavioral: Negative for depression and memory loss. The patient does not have insomnia.     Past Medical History:  Diagnosis Date  . Blind left eye   . Cyst in hand 10/2013   left hand, treated with antibiotic & pain medicine  . Diabetes mellitus without complication ([place])    ?? borderline  being checked  . Hepatitis A    25 YRS AGO  . Prostate cancer ([place])   . Sleep apnea    ????   O2 DESAT  10/26/13  IN ED     Past Surgical History:  Procedure Laterality Date  . EYE SURGERY     LEFT AYE AS CHILD   . JOINT REPLACEMENT     LT KNEE 3 YRS AGO   . 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 TOTAL KNEE ARTHROPLASTY Right 12/04/2013   Procedure: TOTAL KNEE ARTHROPLASTY;  Surgeon: Hessie Dibble, MD;  Location: [facility];  Service: Orthopedics;  Laterality: Right;  Social History:   reports that he quit smoking about 35 years ago. His smoking use included cigarettes. He has a 2.50 pack-year smoking history. He has never used smokeless tobacco. He reports that he does not drink alcohol or use drugs.  Family History  Problem Relation Age of Onset  . Stroke Mother   . Heart attack Mother   . Diabetes Mother   . Stroke Father   . Stomach cancer Brother   . Prostate cancer Brother   . Throat cancer Sister     Medications: Patient's Medications  New Prescriptions   No medications on file  Previous Medications   ASPIRIN 325 MG TABLET    Take 325 mg by mouth daily.   ATORVASTATIN (LIPITOR) 10 MG TABLET    Take 1 tablet (10 mg total) by  mouth every morning.   TRIPTORELIN (TRELSTAR) 11.25 MG INJECTION    Inject 11.25 mg into the muscle. Every 6 months   XTANDI 40 MG CAPSULE    TAKE 4 CAPSULES BY MOUTH ONCE DAILY.  Modified Medications   No medications on file  Discontinued Medications   No medications on file    Physical Exam:  Vitals:   10/09/18 1303 10/09/18 1335  BP: (!) 142/80 134/90  Pulse: 68   Temp: 97.9 F (36.6 C)   TempSrc: Oral   SpO2: 98%   Weight: 260 lb (117.9 kg)   Height: 5\' 9"  (1.753 m)    Body mass index is 38.4 kg/m. Wt Readings from Last 3 Encounters:  10/09/18 260 lb (117.9 kg)  07/25/18 260 lb (117.9 kg)  04/10/18 267 lb 6.4 oz (121.3 kg)    Physical Exam Constitutional:      Appearance: Normal appearance. He is well-developed.  Eyes:     General: No scleral icterus.    Comments: OS corneal clouding; OD PERRL  Neck:     Musculoskeletal: Neck supple.     Thyroid: No thyromegaly.     Vascular: No carotid bruit.  Cardiovascular:     Rate and Rhythm: Normal rate and regular rhythm.     Heart sounds: Murmur (1/6 SEM) present. No friction rub. No gallop.   Pulmonary:     Effort: Pulmonary effort is normal.     Breath sounds: Normal breath sounds. No wheezing or rales.  Chest:     Chest wall: No tenderness.  Abdominal:     General: Bowel sounds are normal. There is no abdominal bruit.     Palpations: Abdomen is soft. Abdomen is not rigid. There is no hepatomegaly or pulsatile mass.  Skin:    General: Skin is warm and dry.     Findings: No rash.  Neurological:     Mental Status: He is alert and oriented to person, place, and time.  Psychiatric:        Behavior: Behavior normal.        Thought Content: Thought content normal.        Judgment: Judgment normal.     Labs reviewed: Basic Metabolic Panel: Recent Labs    10/14/17 1040  03/22/18 0946 04/14/18 0822 07/21/18 1413  NA  --    < > 137 136 140  K  --    < > 4.3 4.3 4.1  CL  --    < > 107 105 107  CO2  --    <  > 21* 21 19*  GLUCOSE  --    < > 125* 140* 122*  BUN  --    < >  13 15 14   CREATININE  --    < > 0.88 0.80 1.12  CALCIUM  --    < > 9.5 9.5 9.0  TSH 1.66  --   --   --   --    < > = values in this interval not displayed.   Liver Function Tests: Recent Labs    12/16/17 1120 03/22/18 0946 04/14/18 0822 07/21/18 1413  AST 35 27 20 21   ALT 43 26 17 16   ALKPHOS 130* 130*  --  110  BILITOT 0.9 0.5 0.6 0.3  PROT 7.4 7.4 6.9 7.3  ALBUMIN 3.6 3.7  --  3.8   Recent Labs    11/07/17 1326  LIPASE 30   No results for input(s): AMMONIA in the last 8760 hours. CBC: Recent Labs    03/22/18 0946 04/14/18 0822 07/21/18 1413  WBC 6.0 6.1 6.8  NEUTROABS 2.6 2,891 2.8  HGB 14.1 14.2 13.9  HCT 41.8 39.9 42.0  MCV 93.7 91.1 96.3  PLT 204 251 225   Lipid Panel: Recent Labs    10/14/17 1040 04/14/18 0822  CHOL 209* 183  HDL 82 80  LDLCALC 108* 87  TRIG 92 71  CHOLHDL 2.5 2.3   TSH: Recent Labs    10/14/17 1040  TSH 1.66   A1C: Lab Results  Component Value Date   HGBA1C 6.8 (H) 04/14/2018     Assessment/Plan 1. Hx of colonic polyps - Ambulatory referral to Gastroenterology- due for repeat colonoscopy but he would like to do cologuard, due to hx of polyps will refer to GI for evaluation  2. Type 2 diabetes mellitus with diabetic mononeuropathy, without long-term current use of insulin (HCC) -a1c at goal on last labs, diet controlled, continue diet and exercise modifications. Discussed adding ARB/ACEi for renal protection and blood pressure control.  - Hemoglobin A1c  3. Prostate cancer [facility]) Ongoing, continues under the care of oncologist and urologist.   4. Essential hypertension Elevated reading today, some improvement on repeat, discussed low sodium diet and goal bp <140/90.  He is diabetic so would benefit from adding acei or arb however was not interested in adding an additional medication today despite education. - COMPLETE METABOLIC PANEL WITH GFR - CBC with  Differential/Platelet  5. Body mass index (BMI) of 38.0-38.9 in adult Noted today  6. Class 2 severe obesity due to excess calories with serious comorbidity and body mass index (BMI) of 38.0 to 38.9 in adult [facility]) Noted today, discussed weight loss with diet and exercise.   7. Mixed hyperlipidemia Continues on lipitor with diet and exercise modifications.   8. Primary osteoarthritis of right knee Without pain or mobility issues s/p total knee.   Next appt: 3 months to follow up blood pressure, consider adding ARB  Tanita Palinkas K. [place], [facility] [phone]

## 2018-10-10 LAB — CBC WITH DIFFERENTIAL/PLATELET
Absolute Monocytes: 750 cells/uL (ref 200–950)
Basophils Absolute: 38 cells/uL (ref 0–200)
Basophils Relative: 0.6 %
Eosinophils Absolute: 13 cells/uL — ABNORMAL LOW (ref 15–500)
Eosinophils Relative: 0.2 %
HCT: 41.5 % (ref 38.5–50.0)
Hemoglobin: 14.2 g/dL (ref 13.2–17.1)
Lymphs Abs: 2432 cells/uL (ref 850–3900)
MCH: 31.5 pg (ref 27.0–33.0)
MCHC: 34.2 g/dL (ref 32.0–36.0)
MCV: 92 fL (ref 80.0–100.0)
MPV: 11.2 fL (ref 7.5–12.5)
Monocytes Relative: 11.9 %
Neutro Abs: 3068 cells/uL (ref 1500–7800)
Neutrophils Relative %: 48.7 %
Platelets: 231 10*3/uL (ref 140–400)
RBC: 4.51 10*6/uL (ref 4.20–5.80)
RDW: 12.7 % (ref 11.0–15.0)
Total Lymphocyte: 38.6 %
WBC: 6.3 10*3/uL (ref 3.8–10.8)

## 2018-10-10 LAB — COMPLETE METABOLIC PANEL WITH GFR
AG Ratio: 1.3 (calc) (ref 1.0–2.5)
ALT: 12 U/L (ref 9–46)
AST: 17 U/L (ref 10–35)
Albumin: 4 g/dL (ref 3.6–5.1)
Alkaline phosphatase (APISO): 113 U/L (ref 35–144)
BUN: 17 mg/dL (ref 7–25)
CO2: 21 mmol/L (ref 20–32)
Calcium: 9.4 mg/dL (ref 8.6–10.3)
Chloride: 105 mmol/L (ref 98–110)
Creat: 0.84 mg/dL (ref 0.70–1.18)
GFR, Est African American: 100 mL/min/{1.73_m2} (ref >=60)
GFR, Est Non African American: 86 mL/min/{1.73_m2} (ref >=60)
Globulin: 3.1 g/dL (calc) (ref 1.9–3.7)
Glucose, Bld: 115 mg/dL (ref 65–139)
Potassium: 4.4 mmol/L (ref 3.5–5.3)
Sodium: 135 mmol/L (ref 135–146)
Total Bilirubin: 0.4 mg/dL (ref 0.2–1.2)
Total Protein: 7.1 g/dL (ref 6.1–8.1)

## 2018-10-10 LAB — HEMOGLOBIN A1C
Hgb A1c MFr Bld: 6.6 % of total Hgb — ABNORMAL HIGH (ref <5.7)
Mean Plasma Glucose: 143 (calc)
eAG (mmol/L): 7.9 (calc)

## 2018-10-10 MED FILL — XTANDI 40 MG CAPSULE: 40 | 30 days supply | Qty: 120 | Fill #1

## 2018-10-16 ENCOUNTER — Other Ambulatory Visit: Payer: Self-pay

## 2018-10-16 ENCOUNTER — Ambulatory Visit: Payer: Self-pay

## 2018-10-16 ENCOUNTER — Encounter: Payer: Self-pay | Admitting: Nurse Practitioner

## 2018-10-16 ENCOUNTER — Ambulatory Visit (INDEPENDENT_AMBULATORY_CARE_PROVIDER_SITE_OTHER): Payer: Medicare HMO | Admitting: Nurse Practitioner

## 2018-10-16 DIAGNOSIS — Z1211 Encounter for screening for malignant neoplasm of colon: Secondary | ICD-10-CM

## 2018-10-16 DIAGNOSIS — Z0000 Encounter for general adult medical examination without abnormal findings: Secondary | ICD-10-CM

## 2018-10-16 DIAGNOSIS — Z1212 Encounter for screening for malignant neoplasm of rectum: Secondary | ICD-10-CM | POA: Diagnosis not present

## 2018-10-16 NOTE — Patient Instructions (Signed)
[name] , Thank you for taking time to come for your Medicare Wellness Visit. I appreciate your ongoing commitment to your health goals. Please review the following plan we discussed and let me know if I can assist you in the future.   Screening recommendations/referrals: Colonoscopy GI referral placed Recommended yearly ophthalmology/optometry visit for glaucoma screening and checkup Recommended yearly dental visit for hygiene and checkup  Vaccinations: Influenza vaccine- does not get immunizations Pneumococcal vaccine -declines Tdap vaccine -declines Shingles vaccine -declines (has not had chicken pox)    Advanced directives: complete paper work, get notarized and bring back to office   Conditions/risks identified: need follow up with GI due to family hx of colon cancer.   Next appointment: 1 year  Preventive Care 12 Years and Older, Male Preventive care refers to lifestyle choices and visits with your health care provider that can promote health and wellness. What does preventive care include?  A yearly physical exam. This is also called an annual well check.  Dental exams once or twice a year.  Routine eye exams. Ask your health care provider how often you should have your eyes checked.  Personal lifestyle choices, including:  Daily care of your teeth and gums.  Regular physical activity.  Eating a healthy diet.  Avoiding tobacco and drug use.  Limiting alcohol use.  Practicing safe sex.  Taking low doses of aspirin every day.  Taking vitamin and mineral supplements as recommended by your health care provider. What happens during an annual well check? The services and screenings done by your health care provider during your annual well check will depend on your age, overall health, lifestyle risk factors, and family history of disease. Counseling  Your health care provider may ask you questions about your:  Alcohol use.  Tobacco use.  Drug use.   Emotional well-being.  Home and relationship well-being.  Sexual activity.  Eating habits.  History of falls.  Memory and ability to understand (cognition).  Work and work Statistician. Screening  You may have the following tests or measurements:  Height, weight, and BMI.  Blood pressure.  Lipid and cholesterol levels. These may be checked every 5 years, or more frequently if you are over 55 years old.  Skin check.  Lung cancer screening. You may have this screening every year starting at age 69 if you have a 30-pack-year history of smoking and currently smoke or have quit within the past 15 years.  Fecal occult blood test (FOBT) of the stool. You may have this test every year starting at age 26.  Flexible sigmoidoscopy or colonoscopy. You may have a sigmoidoscopy every 5 years or a colonoscopy every 10 years starting at age 71.  Prostate cancer screening. Recommendations will vary depending on your family history and other risks.  Hepatitis C blood test.  Hepatitis B blood test.  Sexually transmitted disease (STD) testing.  Diabetes screening. This is done by checking your blood sugar (glucose) after you have not eaten for a while (fasting). You may have this done every 1-3 years.  Abdominal aortic aneurysm (AAA) screening. You may need this if you are a current or former smoker.  Osteoporosis. You may be screened starting at age 49 if you are at high risk. Talk with your health care provider about your test results, treatment options, and if necessary, the need for more tests. Vaccines  Your health care provider may recommend certain vaccines, such as:  Influenza vaccine. This is recommended every year.  Tetanus, diphtheria,  and acellular pertussis (Tdap, Td) vaccine. You may need a Td booster every 10 years.  Zoster vaccine. You may need this after age 71.  Pneumococcal 13-valent conjugate (PCV13) vaccine. One dose is recommended after age 62.  Pneumococcal  polysaccharide (PPSV23) vaccine. One dose is recommended after age 4. Talk to your health care provider about which screenings and vaccines you need and how often you need them. This information is not intended to replace advice given to you by your health care provider. Make sure you discuss any questions you have with your health care provider. Document Released: 02/28/2015 Document Revised: 10/22/2015 Document Reviewed: 12/03/2014 Elsevier Interactive Patient Education  2017 [place] Prevention in the Home Falls can cause injuries. They can happen to people of all ages. There are many things you can do to make your home safe and to help prevent falls. What can I do on the outside of my home?  Regularly fix the edges of walkways and driveways and fix any cracks.  Remove anything that might make you trip as you walk through a door, such as a raised step or threshold.  Trim any bushes or trees on the path to your home.  Use bright outdoor lighting.  Clear any walking paths of anything that might make someone trip, such as rocks or tools.  Regularly check to see if handrails are loose or broken. Make sure that both sides of any steps have handrails.  Any raised decks and porches should have guardrails on the edges.  Have any leaves, snow, or ice cleared regularly.  Use sand or salt on walking paths during winter.  Clean up any spills in your garage right away. This includes oil or grease spills. What can I do in the bathroom?  Use night lights.  Install grab bars by the toilet and in the tub and shower. Do not use towel bars as grab bars.  Use non-skid mats or decals in the tub or shower.  If you need to sit down in the shower, use a plastic, non-slip stool.  Keep the floor dry. Clean up any water that spills on the floor as soon as it happens.  Remove soap buildup in the tub or shower regularly.  Attach bath mats securely with double-sided non-slip rug tape.   Do not have throw rugs and other things on the floor that can make you trip. What can I do in the bedroom?  Use night lights.  Make sure that you have a light by your bed that is easy to reach.  Do not use any sheets or blankets that are too big for your bed. They should not hang down onto the floor.  Have a firm chair that has side arms. You can use this for support while you get dressed.  Do not have throw rugs and other things on the floor that can make you trip. What can I do in the kitchen?  Clean up any spills right away.  Avoid walking on wet floors.  Keep items that you use a lot in easy-to-reach places.  If you need to reach something above you, use a strong step stool that has a grab bar.  Keep electrical cords out of the way.  Do not use floor polish or wax that makes floors slippery. If you must use wax, use non-skid floor wax.  Do not have throw rugs and other things on the floor that can make you trip. What can I do with my  stairs?  Do not leave any items on the stairs.  Make sure that there are handrails on both sides of the stairs and use them. Fix handrails that are broken or loose. Make sure that handrails are as long as the stairways.  Check any carpeting to make sure that it is firmly attached to the stairs. Fix any carpet that is loose or worn.  Avoid having throw rugs at the top or bottom of the stairs. If you do have throw rugs, attach them to the floor with carpet tape.  Make sure that you have a light switch at the top of the stairs and the bottom of the stairs. If you do not have them, ask someone to add them for you. What else can I do to help prevent falls?  Wear shoes that:  Do not have high heels.  Have rubber bottoms.  Are comfortable and fit you well.  Are closed at the toe. Do not wear sandals.  If you use a stepladder:  Make sure that it is fully opened. Do not climb a closed stepladder.  Make sure that both sides of the  stepladder are locked into place.  Ask someone to hold it for you, if possible.  Clearly mark and make sure that you can see:  Any grab bars or handrails.  First and last steps.  Where the edge of each step is.  Use tools that help you move around (mobility aids) if they are needed. These include:  Canes.  Walkers.  Scooters.  Crutches.  Turn on the lights when you go into a dark area. Replace any light bulbs as soon as they burn out.  Set up your furniture so you have a clear path. Avoid moving your furniture around.  If any of your floors are uneven, fix them.  If there are any pets around you, be aware of where they are.  Review your medicines with your doctor. Some medicines can make you feel dizzy. This can increase your chance of falling. Ask your doctor what other things that you can do to help prevent falls. This information is not intended to replace advice given to you by your health care provider. Make sure you discuss any questions you have with your health care provider. Document Released: 11/28/2008 Document Revised: 07/10/2015 Document Reviewed: 03/08/2014 Elsevier Interactive Patient Education  2017 [name].

## 2018-10-16 NOTE — Progress Notes (Signed)
Subjective:   Samuel [name] is a 73 y.o. male who presents for Medicare Annual/Subsequent preventive examination.  Review of Systems:   Cardiac Risk Factors include: advanced age (>65men, >75 women);dyslipidemia;hypertension;obesity (BMI >30kg/m2);diabetes mellitus;sedentary lifestyle;male gender     Objective:    Vitals: There were no vitals taken for this visit.  There is no height or weight on file to calculate BMI.  Advanced Directives 04/10/2018 10/14/2017 05/11/2017 10/08/2016 06/02/2016 01/30/2016 12/09/2015  Does Patient Have a Medical Advance Directive? No No No No No No No  Would patient like information on creating a medical advance directive? No - Patient declined Yes (MAU/Ambulatory/Procedural Areas - Information given) No - Patient declined Yes (MAU/Ambulatory/Procedural Areas - Information given) - - No - patient declined information    Tobacco Social History   Tobacco Use  Smoking Status Former Smoker  . Packs/day: 0.50  . Years: 5.00  . Pack years: 2.50  . Types: Cigarettes  . Quit date: 02/16/1983  . Years since quitting: 35.6  Smokeless Tobacco Never Used     Counseling given: Not Answered   Clinical Intake:  Pre-visit preparation completed: Yes        BMI - recorded: 38.4 Nutritional Status: BMI > 30  Obese Nutritional Risks: None Diabetes: Yes  How often do you need to have someone help you when you read instructions, pamphlets, or other written materials from your doctor or pharmacy?: 1 - Never What is the last grade level you completed in school?: 10th grade        Past Medical History:  Diagnosis Date  . Blind left eye   . Cyst in hand 10/2013   left hand, treated with antibiotic & pain medicine  . Diabetes mellitus without complication ([place])    ?? borderline  being checked  . Hepatitis A    25 YRS AGO  . Prostate cancer ([place])   . Sleep apnea    ????   O2 DESAT  10/26/13  IN ED     Past Surgical History:  Procedure Laterality Date   . EYE SURGERY     LEFT AYE AS CHILD   . JOINT REPLACEMENT     LT KNEE 3 YRS AGO   . 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 TOTAL KNEE ARTHROPLASTY Right 12/04/2013   Procedure: TOTAL KNEE ARTHROPLASTY;  Surgeon: Hessie Dibble, MD;  Location: [facility];  Service: Orthopedics;  Laterality: Right;   Family History  Problem Relation Age of Onset  . Stroke Mother   . Heart attack Mother   . Diabetes Mother   . Stroke Father   . Stomach cancer Brother   . Prostate cancer Brother   . Throat cancer Sister    Social History   Socioeconomic History  . Marital status: Widowed    Spouse name: Not on file  . Number of children: Not on file  . Years of education: Not on file  . Highest education level: Not on file  Occupational History  . Not on file  Social Needs  . Financial resource strain: Not hard at all  . Food insecurity    Worry: Never true    Inability: Never true  . Transportation needs    Medical: No    Non-medical: No  Tobacco Use  . Smoking status: Former Smoker    Packs/day: 0.50    Years: 5.00    Pack years: 2.50    Types: Cigarettes    Quit date: 02/16/1983  Years since quitting: 35.6  . Smokeless tobacco: Never Used  Substance and Sexual Activity  . Alcohol use: No    Alcohol/week: 0.0 standard drinks  . Drug use: No  . Sexual activity: Not on file  Lifestyle  . Physical activity    Days per week: 3 days    Minutes per session: 10 min  . Stress: Not at all  Relationships  . Social connections    Talks on phone: More than three times a week    Gets together: More than three times a week    Attends religious service: More than 4 times per year    Active member of club or organization: No    Attends meetings of clubs or organizations: Never    Relationship status: Widowed  Other Topics Concern  . 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 aspirin EC 81 MG tablet Take 81 mg by mouth daily.  Marland Kitchen atorvastatin (LIPITOR) 10 MG tablet Take 1 tablet (10 mg total) by mouth every morning.  Marland Kitchen triptorelin (TRELSTAR) 11.25 MG injection Inject 11.25 mg into the muscle. Every 6 months  . XTANDI 40 MG capsule TAKE 4 CAPSULES BY MOUTH ONCE DAILY.  . [DISCONTINUED] aspirin 325 MG tablet Take 325 mg by mouth daily.   No facility-administered encounter medications on file as of 10/16/2018.     Activities of Daily Living In your present state of health, do you have any difficulty performing the following activities: 10/16/2018  Hearing? N  Vision? N  Difficulty concentrating or making decisions? N  Walking or climbing stairs? N  Dressing or bathing? N  Doing errands, shopping? N  Preparing Food and eating ? N  Using the Toilet? N  In the past six months, have you accidently leaked urine? Y  Do you have problems with loss of bowel control? N  Managing your Medications? N  Managing your Finances? N  Housekeeping or managing your Housekeeping? N  Some recent data might be hidden    Patient Care Team: [name], NP as PCP - General (Geriatric Medicine) Festus Aloe, MD as Consulting Physician (Urology)   Assessment:   This is a routine wellness examination for Samuel [name].  Exercise Activities and Dietary recommendations Current Exercise Habits: Home exercise routine, Type of exercise: walking;calisthenics, Frequency (Times/Week): 5, Intensity: Moderate   Goals    . Weight (lb) < 200 lb (90.7 kg)     Would like to lose weight by increase in activity and eating better.        Fall Risk Fall Risk  10/16/2018 04/10/2018 05/11/2017 10/13/2016 10/08/2016  Falls in the past year? 0 0 No  No Yes  Number falls in past yr: 0 0 - - 1  Injury with Fall? 0 0 - - No   Is the patient's home free of loose throw rugs in walkways, pet beds, electrical cords, etc?   yes      Grab bars in the bathroom? no      Handrails on the stairs?   no stairs       Adequate lighting?   yes  Timed Get Up and Go Performed: na  Depression Screen PHQ 2/9 Scores 10/16/2018 10/14/2017 05/11/2017 10/13/2016  PHQ - 2 Score 0 0 0 0    Cognitive Function MMSE - Mini Mental State Exam 10/14/2017 10/08/2016 05/21/2015  Not completed: - - (No Data)  Orientation to time 5 4 5   Orientation to Place 5 5 5   Registration 3 3 3   Attention/ Calculation 5 5 5   Recall 3 1 1   Language- name 2 objects 2 2 2   Language- repeat 1 1 1   Language- follow 3 step command 3 3 3   Language- read & follow direction 1 1 1   Write a sentence 1 1 1   Copy design 1 1 1   Total score 30 27 28      6CIT Screen 10/16/2018  What Year? 0 points  What month? 0 points  What time? 0 points  Count back from 20 0 points  Months in reverse 2 points  Repeat phrase 6 points  Total Score 8     There is no immunization history on file for this patient.  Qualifies for Shingles Vaccine? decline  Screening Tests Health Maintenance  Topic Date Due  . OPHTHALMOLOGY EXAM  05/11/2018  . Fecal DNA (Cologuard)  06/03/2018  . INFLUENZA VACCINE  09/16/2018  . URINE MICROALBUMIN  10/15/2018  . PNA vac Low Risk Adult (1 of 2 - PCV13) 04/11/2019 (Originally 03/25/2009)  . TETANUS/TDAP  04/10/2020 (Originally 03/26/1964)  . FOOT EXAM  04/11/2019  . HEMOGLOBIN A1C  04/11/2019  . Hepatitis C Screening  Completed   Cancer Screenings: Lung: Low Dose CT Chest recommended if Age 71-80 years, 30 pack-year currently smoking  OR have quit w/in 15years. Patient does not qualify. Colorectal: over due - coloscopy recommended due to family hx of colon cancer  Additional Screenings:  Hepatitis C Screening: done      Plan:     I have personally reviewed and noted the following in the patient's chart:   . Medical and social history . Use of alcohol, tobacco or illicit drugs  . Current medications and supplements . Functional ability and status . Nutritional status . Physical activity . Advanced directives . List of other physicians . Hospitalizations, surgeries, and ER visits in previous 12 months . Vitals . Screenings to include cognitive, depression, and falls . Referrals and appointments  In addition, I have reviewed and discussed with patient certain preventive protocols, quality metrics, and best practice recommendations. A written personalized care plan for preventive services as well as general preventive health recommendations were provided to patient.     [name], NP  10/16/2018

## 2018-10-16 NOTE — Progress Notes (Signed)
    This service is provided via telemedicine  No vital signs collected/recorded due to the encounter was a telemedicine visit.   Location of patient (ex: home, work): Home  Patient consents to a telephone visit:  Yes  Location of the provider (ex: office, home): [facility], Office   Name of any referring provider:  N/A  Names of all persons participating in the telemedicine service and their role in the encounter: S.Chrae B/CMA, Jessica Eubanks, NP, and Patient   Time spent on call:  8 min with medical assistant  

## 2018-10-17 ENCOUNTER — Encounter: Payer: Self-pay | Admitting: Gastroenterology

## 2018-10-17 NOTE — Addendum Note (Signed)
Addended by: [name] on: 10/17/2018 08:30 AM   Modules accepted: Level of Service

## 2018-11-02 ENCOUNTER — Other Ambulatory Visit: Payer: Self-pay

## 2018-11-02 ENCOUNTER — Ambulatory Visit (AMBULATORY_SURGERY_CENTER): Payer: Self-pay | Admitting: *Deleted

## 2018-11-02 VITALS — Temp 98.2°F | Ht 69.0 in | Wt 259.0 lb

## 2018-11-02 DIAGNOSIS — Z86010 Personal history of colonic polyps: Secondary | ICD-10-CM

## 2018-11-02 MED ORDER — SUPREP BOWEL PREP KIT 17.5-3.13-1.6 GM/177ML PO SOLN: ORAL | 0 refills | Status: DC

## 2018-11-02 NOTE — Progress Notes (Signed)
No egg or soy allergy  No home oxygen use or problems with anesthesia  No medications for weight loss taken  emmi information given  Pt states he takes Metamucil and has regular bowel movements- he is instructed to stop 5 days prior to procedure.  He states he has "always had polyps with my colonoscopies."  Pt is aware that care partner will wait in the car during procedure; if they feel like they will be too hot to wait in the car; they may wait in the lobby.  We want them to wear a mask (we do not have any that we can provide them), practice social distancing, and we will check their temperatures when they get here.  I did remind patient that their care partner needs to stay in the parking lot the entire time. Pt will wear mask into building.

## 2018-11-07 ENCOUNTER — Other Ambulatory Visit: Payer: Self-pay | Admitting: Oncology

## 2018-11-07 DIAGNOSIS — C61 Malignant neoplasm of prostate: Secondary | ICD-10-CM

## 2018-11-10 ENCOUNTER — Encounter: Payer: Self-pay | Admitting: Gastroenterology

## 2018-11-13 ENCOUNTER — Other Ambulatory Visit: Payer: Self-pay | Admitting: *Deleted

## 2018-11-13 DIAGNOSIS — E782 Mixed hyperlipidemia: Secondary | ICD-10-CM

## 2018-11-13 MED ORDER — ATORVASTATIN CALCIUM 10 MG PO TABS: 10.0000 mg | ORAL_TABLET | Freq: Every morning | ORAL | 1 refills | Status: DC

## 2018-11-13 MED FILL — XTANDI 40 MG CAPSULE: 40 | 30 days supply | Qty: 120 | Fill #0

## 2018-11-13 NOTE — Telephone Encounter (Signed)
Patient requested refill

## 2018-11-16 ENCOUNTER — Encounter: Payer: Medicare HMO | Admitting: Gastroenterology

## 2018-11-21 ENCOUNTER — Other Ambulatory Visit: Payer: Self-pay

## 2018-11-21 ENCOUNTER — Inpatient Hospital Stay: Payer: Medicare HMO | Attending: Oncology

## 2018-11-21 DIAGNOSIS — C61 Malignant neoplasm of prostate: Secondary | ICD-10-CM | POA: Diagnosis not present

## 2018-11-21 DIAGNOSIS — Z79899 Other long term (current) drug therapy: Secondary | ICD-10-CM | POA: Insufficient documentation

## 2018-11-21 DIAGNOSIS — Z7982 Long term (current) use of aspirin: Secondary | ICD-10-CM | POA: Diagnosis not present

## 2018-11-21 DIAGNOSIS — I10 Essential (primary) hypertension: Secondary | ICD-10-CM | POA: Insufficient documentation

## 2018-11-21 LAB — CMP (CANCER CENTER ONLY)
ALT: 13 U/L (ref 0–44)
AST: 17 U/L (ref 15–41)
Albumin: 3.9 g/dL (ref 3.5–5.0)
Alkaline Phosphatase: 122 U/L (ref 38–126)
Anion gap: 11 (ref 5–15)
BUN: 14 mg/dL (ref 8–23)
CO2: 21 mmol/L — ABNORMAL LOW (ref 22–32)
Calcium: 9.5 mg/dL (ref 8.9–10.3)
Chloride: 108 mmol/L (ref 98–111)
Creatinine: 0.97 mg/dL (ref 0.61–1.24)
GFR, Est AFR Am: >60 mL/min (ref >60)
GFR, Estimated: >60 mL/min (ref >60)
Glucose, Bld: 129 mg/dL — ABNORMAL HIGH (ref 70–99)
Potassium: 4.5 mmol/L (ref 3.5–5.1)
Sodium: 140 mmol/L (ref 135–145)
Total Bilirubin: 0.4 mg/dL (ref 0.3–1.2)
Total Protein: 7.4 g/dL (ref 6.5–8.1)

## 2018-11-21 LAB — CBC WITH DIFFERENTIAL (CANCER CENTER ONLY)
Abs Immature Granulocytes: 0.04 10*3/uL (ref 0.00–0.07)
Basophils Absolute: 0 10*3/uL (ref 0.0–0.1)
Basophils Relative: 1 %
Eosinophils Absolute: 0.1 10*3/uL (ref 0.0–0.5)
Eosinophils Relative: 2 %
HCT: 42.6 % (ref 39.0–52.0)
Hemoglobin: 14.2 g/dL (ref 13.0–17.0)
Immature Granulocytes: 1 %
Lymphocytes Relative: 41 %
Lymphs Abs: 2.5 10*3/uL (ref 0.7–4.0)
MCH: 31.5 pg (ref 26.0–34.0)
MCHC: 33.3 g/dL (ref 30.0–36.0)
MCV: 94.5 fL (ref 80.0–100.0)
Monocytes Absolute: 0.6 10*3/uL (ref 0.1–1.0)
Monocytes Relative: 10 %
Neutro Abs: 2.7 10*3/uL (ref 1.7–7.7)
Neutrophils Relative %: 45 %
Platelet Count: 226 10*3/uL (ref 150–400)
RBC: 4.51 MIL/uL (ref 4.22–5.81)
RDW: 13.2 % (ref 11.5–15.5)
WBC Count: 5.9 10*3/uL (ref 4.0–10.5)
nRBC: 0 % (ref 0.0–0.2)

## 2018-11-22 LAB — PROSTATE-SPECIFIC AG, SERUM (LABCORP): Prostate Specific Ag, Serum: 3.1 ng/mL (ref 0.0–4.0)

## 2018-11-23 ENCOUNTER — Inpatient Hospital Stay (HOSPITAL_BASED_OUTPATIENT_CLINIC_OR_DEPARTMENT_OTHER): Payer: Medicare HMO | Admitting: Oncology

## 2018-11-23 ENCOUNTER — Other Ambulatory Visit: Payer: Self-pay

## 2018-11-23 VITALS — BP 144/80 | HR 84 | Temp 97.8°F | Resp 18 | Ht 69.0 in | Wt 257.2 lb

## 2018-11-23 DIAGNOSIS — Z7982 Long term (current) use of aspirin: Secondary | ICD-10-CM | POA: Diagnosis not present

## 2018-11-23 DIAGNOSIS — Z79899 Other long term (current) drug therapy: Secondary | ICD-10-CM | POA: Diagnosis not present

## 2018-11-23 DIAGNOSIS — C61 Malignant neoplasm of prostate: Secondary | ICD-10-CM | POA: Diagnosis not present

## 2018-11-23 DIAGNOSIS — I10 Essential (primary) hypertension: Secondary | ICD-10-CM | POA: Diagnosis not present

## 2018-11-23 NOTE — Progress Notes (Signed)
Hematology and Oncology Follow Up Visit  Samuel [name] [id number] April 29, 1945 73 y.o. 11/23/2018 9:40 AM Dewaine Oats, [name], NPEubanks, [name], NP   Principle Diagnosis: 73 year old man with castration-resistant prostate cancer a diagnosed in 2017.  He was initially diagnosed with Gleason score 4+4 = 8 and a PSA of 8.97 in 2010.  As biochemical relapse only at this time.  Prior Therapy: He was treated with external beam radiation and androgen deprivation.  He developed biochemical relapse with a PSA rise and he has been on combined androgen deprivation with [place] agonist on bicalutamide. PSA in June 2016 was 6.11, in December 2016 was 10, in June 2017 was 9 and in September 2017 was up to 12.  Staging workup including CT scan of the abdomen and pelvis as well as bone scan showed no evidence of metastatic disease.  His PSA was up to 22.5 in April 2018.   Current therapy:   Androgen Deprivation given under the care of Dr. Junious Silk [facility].  Xtandi 160 mg daily started in May 2018.  Interim History: [name] returns today for a follow-up visit.  Since the last visit, he continues to tolerate Xtandi without any complaints.  He denies any nausea, vomiting or edema.  He denies any hematuria or dysuria.  His performance status and quality of life remains unchanged.  He denies any pathological fractures or bone pain.   Patient denied any alteration mental status, neuropathy, confusion or dizziness.  Denies any headaches or lethargy.  Denies any night sweats, weight loss or changes in appetite.  Denied orthopnea, dyspnea on exertion or chest discomfort.  Denies shortness of breath, difficulty breathing hemoptysis or cough.  Denies any abdominal distention, nausea, early satiety or dyspepsia.  Denies any hematuria, frequency, dysuria or nocturia.  Denies any skin irritation, dryness or rash.  Denies any ecchymosis or petechiae.  Denies any lymphadenopathy or clotting.  Denies any heat or cold  intolerance.  Denies any anxiety or depression.  Remaining review of system is negative.               Medications: Without any changes on review. Current Outpatient Medications  Medication Sig Dispense Refill  . aspirin EC 81 MG tablet Take 81 mg by mouth daily.    Marland Kitchen atorvastatin (LIPITOR) 10 MG tablet Take 1 tablet (10 mg total) by mouth every morning. 90 tablet 1  . Na Sulfate-K Sulfate-Mg Sulf (SUPREP BOWEL PREP KIT) 17.5-3.13-1.6 GM/177ML SOLN Suprep as directed, no substitutions 354 mL 0  . Psyllium (METAMUCIL PO) Take by mouth daily.    Marland Kitchen triptorelin (TRELSTAR) 11.25 MG injection Inject 11.25 mg into the muscle. Every 6 months    . XTANDI 40 MG capsule TAKE 4 CAPSULES BY MOUTH ONCE DAILY. 120 capsule 1   No current facility-administered medications for this visit.      Allergies:  Allergies  Allergen Reactions  . Other Other (See Comments)    Patient stated,"if I touch felt (material) I get "knots" on my body."  . Peach [Prunus Persica] Other (See Comments)    Patient stated,"if I touch or eat peach fuzz, I get "knots" all over my face and mouth."     Past Medical History, Surgical history, Social history, and Family History updated without any changes.   Physical Exam:  Blood pressure (!) 144/80, pulse 84, temperature 97.8 F (36.6 C), temperature source Temporal, resp. rate 18, height _0  (1.753 m), weight 257 lb 3.2 oz (116.7 kg), SpO2 99 %.  ECOG: 0    General appearance: Alert, awake without any distress. Head: Atraumatic without abnormalities Oropharynx: Without any thrush or ulcers. Eyes: No scleral icterus. Lymph nodes: No lymphadenopathy noted in the cervical, supraclavicular, or axillary nodes Heart:regular rate and rhythm, without any murmurs or gallops.   Lung: Clear to auscultation without any rhonchi, wheezes or dullness to percussion. Abdomin: Soft, nontender without any shifting dullness or ascites. Musculoskeletal: No clubbing  or cyanosis. Neurological: No motor or sensory deficits. Skin: No rashes or lesions.        Lab Results: Lab Results  Component Value Date   WBC 5.9 11/21/2018   HGB 14.2 11/21/2018   HCT 42.6 11/21/2018   MCV 94.5 11/21/2018   PLT 226 11/21/2018     Chemistry      Component Value Date/Time   NA 140 11/21/2018 1055   NA 137 12/15/2016 1421   K 4.5 11/21/2018 1055   K 4.4 12/15/2016 1421   CL 108 11/21/2018 1055   CO2 21 (L) 11/21/2018 1055   CO2 19 (L) 12/15/2016 1421   BUN 14 11/21/2018 1055   BUN 13.4 12/15/2016 1421   CREATININE 0.97 11/21/2018 1055   CREATININE 0.84 10/09/2018 1335   CREATININE 0.8 12/15/2016 1421      Component Value Date/Time   CALCIUM 9.5 11/21/2018 1055   CALCIUM 9.4 12/15/2016 1421   ALKPHOS 122 11/21/2018 1055   ALKPHOS 92 12/15/2016 1421   AST 17 11/21/2018 1055   AST 17 12/15/2016 1421   ALT 13 11/21/2018 1055   ALT 12 12/15/2016 1421   BILITOT 0.4 11/21/2018 1055   BILITOT 0.37 12/15/2016 1421     Results for PASCAL, STIGGERS (MRN [id number]) as of 11/23/2018 09:34  Ref. Range 07/21/2018 14:13 11/21/2018 10:55  Prostate Specific Ag, Serum Latest Ref Range: 0.0 - 4.0 ng/mL 3.0 3.1      Impression and Plan:   73 year old man with  1.  Castration-resistant prostate cancer diagnosed in 2017.  He presented with biochemical relapse and no measurable disease at this time.   He has excellent benefit associated with Xtandi with PSA remains under excellent control.  Imaging studies in 2019 did not show any evidence to suggest disease relapse at this time.  The natural course of this disease and future treatment options were reiterated.  Risks and benefits of long-term complications associated with [name] were also reviewed.  Long-term issues including hypertension and rarely seizures were reiterated.  Different salvage therapy options including Xofigo, systemic chemotherapy were also reviewed.  After discussion today given his excellent PSA  response and reasonable disease status that is stable I have recommended continuing Xtandi with the same dose and schedule.    2. Androgen depravation: He continues to receive long-term androgen deprivation under the care of Dr. Junious Silk.  I recommended continuing this indefinitely.  3.  Hypertension: Blood pressure mildly elevated today.  This will continue to be monitored on Xtandi.  Adjusting his blood pressure medication may be needed in the future.   4. Follow-up: In 4 months for repeat evaluation.  25  minutes was spent with the patient face-to-face today.  More than 50% of time was d spent on reviewing the natural course of this disease, future treatment options and complications to his current therapy and future therapies.  Zola Button, MD 10/8/20209:40 AM

## 2018-11-24 ENCOUNTER — Encounter: Payer: Medicare HMO | Admitting: Gastroenterology

## 2018-12-08 MED FILL — XTANDI 40 MG CAPSULE: 40 | 30 days supply | Qty: 120 | Fill #1

## 2019-01-02 ENCOUNTER — Other Ambulatory Visit: Payer: Self-pay | Admitting: Oncology

## 2019-01-02 DIAGNOSIS — C61 Malignant neoplasm of prostate: Secondary | ICD-10-CM

## 2019-01-05 MED FILL — XTANDI 40 MG CAPSULE: 40 | 30 days supply | Qty: 120 | Fill #0

## 2019-01-09 ENCOUNTER — Ambulatory Visit: Payer: Medicare HMO | Admitting: Nurse Practitioner

## 2019-01-10 ENCOUNTER — Ambulatory Visit (INDEPENDENT_AMBULATORY_CARE_PROVIDER_SITE_OTHER): Payer: Medicare HMO | Admitting: Nurse Practitioner

## 2019-01-10 ENCOUNTER — Encounter: Payer: Self-pay | Admitting: Nurse Practitioner

## 2019-01-10 ENCOUNTER — Other Ambulatory Visit: Payer: Self-pay

## 2019-01-10 DIAGNOSIS — C61 Malignant neoplasm of prostate: Secondary | ICD-10-CM | POA: Diagnosis not present

## 2019-01-10 DIAGNOSIS — E1141 Type 2 diabetes mellitus with diabetic mononeuropathy: Secondary | ICD-10-CM

## 2019-01-10 DIAGNOSIS — Z6838 Body mass index (BMI) 38.0-38.9, adult: Secondary | ICD-10-CM | POA: Diagnosis not present

## 2019-01-10 DIAGNOSIS — Z1212 Encounter for screening for malignant neoplasm of rectum: Secondary | ICD-10-CM | POA: Diagnosis not present

## 2019-01-10 DIAGNOSIS — Z1211 Encounter for screening for malignant neoplasm of colon: Secondary | ICD-10-CM

## 2019-01-10 DIAGNOSIS — E782 Mixed hyperlipidemia: Secondary | ICD-10-CM | POA: Diagnosis not present

## 2019-01-10 DIAGNOSIS — I10 Essential (primary) hypertension: Secondary | ICD-10-CM | POA: Diagnosis not present

## 2019-01-10 NOTE — Progress Notes (Signed)
 This service is provided via telemedicine  No vital signs collected/recorded due to the encounter was a telemedicine visit.   Location of patient (ex: home, work):  Home  Patient consents to a telephone visit:  Yes  Location of the provider (ex: office, home):  [facility], Office   Name of any referring provider: N/A  Names of all persons participating in the telemedicine service and their role in the encounter:  S.Chrae B/CMA, Jessica Eubanks, NP, Georgie(Spouse) and Patient   Time spent on call: 7 min with medical assistant      Careteam: Patient Care Team: Eubanks, Jessica K, NP as PCP - General (Geriatric Medicine) Eskridge, Matthew, MD as Consulting Physician (Urology)  Advanced Directive information Does Patient Have a Medical Advance Directive?: No, Would patient like information on creating a medical advance directive?: Yes (MAU/Ambulatory/Procedural Areas - Information given)  Allergies  Allergen Reactions  . Other Other (See Comments)    Patient stated,"if I touch felt (material) I get "knots" on my body."  . Peach [Prunus Persica] Other (See Comments)    Patient stated,"if I touch or eat peach fuzz, I get "knots" all over my face and mouth."     Chief Complaint  Patient presents with  . Medical Management of Chronic Issues    3 month follow-up.Telephone visit   . Quality Metric Gaps    Discuss need for Eye Exam, Cologuard, and MALB   . Immunizations    Refused all vaccine recommendations      HPI: Patient is a 73 y.o. male via tele-visit. Unable to do virtual.  Agreeable for cologuard.    DM- unsure when last eye exam- he was due this year but he has postponed. A1c at goal in august. Does not check blood sugar. No hypoglycemia. Could not tolerate oral medication  htn- does not check blood pressure at home. Reports blood pressure is high at appt but always goes down. Does not wish to start medications.   Prostate cancer- continues on  trelstar and xtandi for prostate cancer. No changes in urination or flow.  Hyperlipidemia- continues on lipitor 10 mg daily  Obesity- reports he is working to get his weight down. Has lost 10 lbs with diet and exericses   Declines all immunization- "never wanted to do this" reports his sister and brother have gotten sick after flu vaccines. "do not want any pneumonia vaccine"   Review of Systems:  Review of Systems  Constitutional: Negative for chills and fever.  HENT: Negative for hearing loss.   Eyes: Negative for blurred vision.  Respiratory: Negative for cough, sputum production and shortness of breath.   Cardiovascular: Negative for chest pain, palpitations and leg swelling.  Gastrointestinal: Negative for abdominal pain, constipation, diarrhea and heartburn.  Genitourinary: Negative for dysuria, frequency and urgency.  Musculoskeletal: Negative for back pain, falls, joint pain and myalgias.  Skin: Negative.   Neurological: Negative for dizziness, sensory change and headaches.  Psychiatric/Behavioral: Negative for depression and memory loss. The patient does not have insomnia.     Past Medical History:  Diagnosis Date  . Arthritis   . Blind left eye   . Cyst in hand 10/2013   left hand, treated with antibiotic & pain medicine  . Diabetes mellitus without complication (HCC)    ?? borderline  being checked- no medications  . Heart murmur   . Hepatitis A    25 YRS AGO  . Hyperlipidemia   . Prostate cancer (HCC)   . Sleep   apnea    ????   O2 DESAT  10/26/13  IN ED , no sleep study ever done per pt- no cpap   Past Surgical History:  Procedure Laterality Date  . COLONOSCOPY    . EYE SURGERY     LEFT AYE AS CHILD   . JOINT REPLACEMENT Bilateral    LT KNEE 3 YRS AGO   . 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 TOTAL KNEE ARTHROPLASTY Right 12/04/2013   Procedure: TOTAL KNEE ARTHROPLASTY;  Surgeon: Peter G Dalldorf, MD;  Location: [facility] OR;  Service:  Orthopedics;  Laterality: Right;   Social History:   reports that he quit smoking about 35 years ago. His smoking use included cigarettes. He has a 2.50 pack-year smoking history. He has never used smokeless tobacco. He reports that he does not drink alcohol or use drugs.  Family History  Problem Relation Age of Onset  . Stroke Mother   . Heart attack Mother   . Diabetes Mother   . Stroke Father   . Stomach cancer Brother   . Prostate cancer Brother   . Throat cancer Sister   . Colon cancer Neg Hx   . Esophageal cancer Neg Hx   . Rectal cancer Neg Hx     Medications: Patient's Medications  New Prescriptions   No medications on file  Previous Medications   ASPIRIN EC 81 MG TABLET    Take 81 mg by mouth daily.   ATORVASTATIN (LIPITOR) 10 MG TABLET    Take 1 tablet (10 mg total) by mouth every morning.   NA SULFATE-K SULFATE-MG SULF (SUPREP BOWEL PREP KIT) 17.5-3.13-1.6 GM/177ML SOLN    Suprep as directed, no substitutions   PSYLLIUM (METAMUCIL PO)    Take by mouth daily.   TRIPTORELIN (TRELSTAR) 11.25 MG INJECTION    Inject 11.25 mg into the muscle. Every 6 months   XTANDI 40 MG CAPSULE    TAKE 4 CAPSULES BY MOUTH ONCE DAILY.  Modified Medications   No medications on file  Discontinued Medications   No medications on file    Physical Exam:  There were no vitals filed for this visit. There is no height or weight on file to calculate BMI. Wt Readings from Last 3 Encounters:  11/23/18 257 lb 3.2 oz (116.7 kg)  11/02/18 259 lb (117.5 kg)  10/09/18 260 lb (117.9 kg)      Labs reviewed: Basic Metabolic Panel: Recent Labs    07/21/18 1413 10/09/18 1335 11/21/18 1055  NA 140 135 140  K 4.1 4.4 4.5  CL 107 105 108  CO2 19* 21 21*  GLUCOSE 122* 115 129*  BUN 14 17 14  CREATININE 1.12 0.84 0.97  CALCIUM 9.0 9.4 9.5   Liver Function Tests: Recent Labs    03/22/18 0946  07/21/18 1413 10/09/18 1335 11/21/18 1055  AST 27   < > 21 17 17  ALT 26   < > 16 12 13   ALKPHOS 130*  --  110  --  122  BILITOT 0.5   < > 0.3 0.4 0.4  PROT 7.4   < > 7.3 7.1 7.4  ALBUMIN 3.7  --  3.8  --  3.9   < > = values in this interval not displayed.   No results for input(s): LIPASE, AMYLASE in the last 8760 hours. No results for input(s): AMMONIA in the last 8760 hours. CBC: Recent Labs    07/21/18 1413 10/09/18 1335 11/21/18 1055    WBC 6.8 6.3 5.9  NEUTROABS 2.8 3,068 2.7  HGB 13.9 14.2 14.2  HCT 42.0 41.5 42.6  MCV 96.3 92.0 94.5  PLT 225 231 226   Lipid Panel: Recent Labs    04/14/18 0822  CHOL 183  HDL 80  LDLCALC 87  TRIG 71  CHOLHDL 2.3   TSH: No results for input(s): TSH in the last 8760 hours. A1C: Lab Results  Component Value Date   HGBA1C 6.6 (H) 10/09/2018     Assessment/Plan 1. Essential hypertension Pt reports blood pressure always improves after sitting but does not have any recent readings. Encouraged to get a bp cuff and monitor at home. Encouraged dash diet as well  2. Type 2 diabetes mellitus with diabetic mononeuropathy, without long-term current use of insulin (HCC) -A1c at goal with diet modifications. Declines to start on ACEi/arb despite education.  -Encouraged dietary compliance, routine foot care/monitoring and to keep up with diabetic eye exams through ophthalmology  -needs microalbumin done, will get at next in office visit.   3. Prostate cancer (HCC) -continues to follow up with urologist and oncologist.  4. Mixed hyperlipidemia Encouraged to continue heart healthy diet. Continues on lipitor.   5. Body mass index (BMI) of 38.0-38.9 in adult Noted positive weight loss, encouraged to continue dietary modification with increase in physical activity/exercise as tolerates  6. Screening for colorectal cancer -agreeable to cologuard, will send off request.   Next appt: 6 months, labs day off appt Jessica K. Eubanks, AGNP  [facility] [phone]     Virtual Visit via Telephone  Note  I connected with pt on 01/10/19 at  9:30 AM EST by telephone and verified that I am speaking with the correct person using two identifiers.  Location: Patient: home Provider: office   I discussed the limitations, risks, security and privacy concerns of performing an evaluation and management service by telephone and the availability of in person appointments. I also discussed with the patient that there may be a patient responsible charge related to this service. The patient expressed understanding and agreed to proceed.   I discussed the assessment and treatment plan with the patient. The patient was provided an opportunity to ask questions and all were answered. The patient agreed with the plan and demonstrated an understanding of the instructions.   The patient was advised to call back or seek an in-person evaluation if the symptoms worsen or if the condition fails to improve as anticipated.  I provided 15 minutes of non-face-to-face time during this encounter.  Jessica K. Eubanks, AGNP Avs printed and mailed    

## 2019-01-10 NOTE — Patient Instructions (Signed)
Get a blood pressure cuff at the pharmacy or [facility] Goal bp is <140/90   DASH Eating Plan DASH stands for "Dietary Approaches to Stop Hypertension." The DASH eating plan is a healthy eating plan that has been shown to reduce high blood pressure (hypertension). It may also reduce your risk for type 2 diabetes, heart disease, and stroke. The DASH eating plan may also help with weight loss. What are tips for following this plan?  General guidelines  Avoid eating more than 2,300 mg (milligrams) of salt (sodium) a day. If you have hypertension, you may need to reduce your sodium intake to 1,500 mg a day.  Limit alcohol intake to no more than 1 drink a day for nonpregnant women and 2 drinks a day for men. One drink equals 12 oz of beer, 5 oz of wine, or 1 oz of hard liquor.  Work with your health care provider to maintain a healthy body weight or to lose weight. Ask what an ideal weight is for you.  Get at least 30 minutes of exercise that causes your heart to beat faster (aerobic exercise) most days of the week. Activities may include walking, swimming, or biking.  Work with your health care provider or diet and nutrition specialist (dietitian) to adjust your eating plan to your individual calorie needs. Reading food labels   Check food labels for the amount of sodium per serving. Choose foods with less than 5 percent of the Daily Value of sodium. Generally, foods with less than 300 mg of sodium per serving fit into this eating plan.  To find whole grains, look for the word "whole" as the first word in the ingredient list. Shopping  Buy products labeled as "low-sodium" or "no salt added."  Buy fresh foods. Avoid canned foods and premade or frozen meals. Cooking  Avoid adding salt when cooking. Use salt-free seasonings or herbs instead of table salt or sea salt. Check with your health care provider or pharmacist before using salt substitutes.  Do not fry foods. Cook foods using healthy  methods such as baking, boiling, grilling, and broiling instead.  Cook with heart-healthy oils, such as olive, canola, soybean, or sunflower oil. Meal planning  Eat a balanced diet that includes: ? 5 or more servings of fruits and vegetables each day. At each meal, try to fill half of your plate with fruits and vegetables. ? Up to 6-8 servings of whole grains each day. ? Less than 6 oz of lean meat, poultry, or fish each day. A 3-oz serving of meat is about the same size as a deck of cards. One egg equals 1 oz. ? 2 servings of low-fat dairy each day. ? A serving of nuts, seeds, or beans 5 times each week. ? Heart-healthy fats. Healthy fats called Omega-3 fatty acids are found in foods such as flaxseeds and coldwater fish, like sardines, salmon, and mackerel.  Limit how much you eat of the following: ? Canned or prepackaged foods. ? Food that is high in trans fat, such as fried foods. ? Food that is high in saturated fat, such as fatty meat. ? Sweets, desserts, sugary drinks, and other foods with added sugar. ? Full-fat dairy products.  Do not salt foods before eating.  Try to eat at least 2 vegetarian meals each week.  Eat more home-cooked food and less restaurant, buffet, and fast food.  When eating at a restaurant, ask that your food be prepared with less salt or no salt, if possible. What foods  are recommended? The items listed may not be a complete list. Talk with your dietitian about what dietary choices are best for you. Grains Whole-grain or whole-wheat bread. Whole-grain or whole-wheat pasta. Brown rice. [place]. Bulgur. Whole-grain and low-sodium cereals. Pita bread. Low-fat, low-sodium crackers. Whole-wheat flour tortillas. Vegetables Fresh or frozen vegetables (raw, steamed, roasted, or grilled). Low-sodium or reduced-sodium tomato and vegetable juice. Low-sodium or reduced-sodium tomato sauce and tomato paste. Low-sodium or reduced-sodium canned  vegetables. Fruits All fresh, dried, or frozen fruit. Canned fruit in natural juice (without added sugar). Meat and other protein foods Skinless chicken or [place]. Ground chicken or [place]. Pork with fat trimmed off. Fish and seafood. Egg whites. Dried beans, peas, or lentils. Unsalted nuts, nut butters, and seeds. Unsalted canned beans. Lean cuts of beef with fat trimmed off. Low-sodium, lean deli meat. Dairy Low-fat (1%) or fat-free (skim) milk. Fat-free, low-fat, or reduced-fat cheeses. Nonfat, low-sodium ricotta or cottage cheese. Low-fat or nonfat yogurt. Low-fat, low-sodium cheese. Fats and oils Soft margarine without trans fats. Vegetable oil. Low-fat, reduced-fat, or light mayonnaise and salad dressings (reduced-sodium). Canola, safflower, olive, soybean, and sunflower oils. Avocado. Seasoning and other foods Herbs. Spices. Seasoning mixes without salt. Unsalted popcorn and pretzels. Fat-free sweets. What foods are not recommended? The items listed may not be a complete list. Talk with your dietitian about what dietary choices are best for you. Grains Baked goods made with fat, such as croissants, muffins, or some breads. Dry pasta or rice meal packs. Vegetables Creamed or fried vegetables. Vegetables in a cheese sauce. Regular canned vegetables (not low-sodium or reduced-sodium). Regular canned tomato sauce and paste (not low-sodium or reduced-sodium). Regular tomato and vegetable juice (not low-sodium or reduced-sodium). Angie Fava. Olives. Fruits Canned fruit in a light or heavy syrup. Fried fruit. Fruit in cream or butter sauce. Meat and other protein foods Fatty cuts of meat. Ribs. Fried meat. Berniece Salines. Sausage. Bologna and other processed lunch meats. Salami. Fatback. Hotdogs. Bratwurst. Salted nuts and seeds. Canned beans with added salt. Canned or smoked fish. Whole eggs or egg yolks. Chicken or [place] with skin. Dairy Whole or 2% milk, cream, and half-and-half. Whole or full-fat  cream cheese. Whole-fat or sweetened yogurt. Full-fat cheese. Nondairy creamers. Whipped toppings. Processed cheese and cheese spreads. Fats and oils Butter. Stick margarine. Lard. Shortening. Ghee. Bacon fat. Tropical oils, such as coconut, palm kernel, or palm oil. Seasoning and other foods Salted popcorn and pretzels. Onion salt, garlic salt, seasoned salt, table salt, and sea salt. Worcestershire sauce. Tartar sauce. Barbecue sauce. Teriyaki sauce. Soy sauce, including reduced-sodium. Steak sauce. Canned and packaged gravies. Fish sauce. Oyster sauce. Cocktail sauce. Horseradish that you find on the shelf. Ketchup. Mustard. Meat flavorings and tenderizers. Bouillon cubes. Hot sauce and Tabasco sauce. Premade or packaged marinades. Premade or packaged taco seasonings. Relishes. Regular salad dressings. Where to find more information:  National Heart, Lung, and [place]: https://wilson-eaton.com/  [name] Heart Association: www.heart.org Summary  The DASH eating plan is a healthy eating plan that has been shown to reduce high blood pressure (hypertension). It may also reduce your risk for type 2 diabetes, heart disease, and stroke.  With the DASH eating plan, you should limit salt (sodium) intake to 2,300 mg a day. If you have hypertension, you may need to reduce your sodium intake to 1,500 mg a day.  When on the DASH eating plan, aim to eat more fresh fruits and vegetables, whole grains, lean proteins, low-fat dairy, and heart-healthy fats.  Work with your  health care provider or diet and nutrition specialist (dietitian) to adjust your eating plan to your individual calorie needs. This information is not intended to replace advice given to you by your health care provider. Make sure you discuss any questions you have with your health care provider. Document Released: 01/21/2011 Document Revised: 01/14/2017 Document Reviewed: 01/26/2016 Elsevier Patient Education  2020 [name].

## 2019-01-18 ENCOUNTER — Telehealth: Payer: Self-pay

## 2019-01-18 NOTE — Telephone Encounter (Signed)
Called patient and discussed results for Fecal Immuno Test. Patient verbalized understanding and denied further questions.

## 2019-01-22 DIAGNOSIS — Z1211 Encounter for screening for malignant neoplasm of colon: Secondary | ICD-10-CM | POA: Diagnosis not present

## 2019-01-22 DIAGNOSIS — Z1212 Encounter for screening for malignant neoplasm of rectum: Secondary | ICD-10-CM | POA: Diagnosis not present

## 2019-01-23 ENCOUNTER — Encounter: Payer: Self-pay | Admitting: Nurse Practitioner

## 2019-01-26 ENCOUNTER — Encounter: Payer: Self-pay | Admitting: Nurse Practitioner

## 2019-01-26 LAB — COLOGUARD: Cologuard: NEGATIVE

## 2019-02-05 MED FILL — XTANDI 40 MG CAPSULE: 40 | 30 days supply | Qty: 120 | Fill #1

## 2019-02-19 DIAGNOSIS — R3912 Poor urinary stream: Secondary | ICD-10-CM | POA: Diagnosis not present

## 2019-02-19 DIAGNOSIS — C61 Malignant neoplasm of prostate: Secondary | ICD-10-CM | POA: Diagnosis not present

## 2019-02-20 ENCOUNTER — Telehealth: Payer: Self-pay | Admitting: Pharmacy Technician

## 2019-02-20 NOTE — Telephone Encounter (Signed)
Oral Oncology Patient Advocate Encounter  Patient has been approved for copay assistance with [employer] (TAF).  [employer] will cover all copayment expenses for Xtandi for the remainder of the calendar year.    The billing information is as follows and has been shared with [facility].   Member ID: [id number] Group ID: [id number] PCN: [id number] BIN: [id number] Eligibility Dates: 02/16/19 to 02/15/20  Fund: [place] Patient [place] Phone [phone] Fax [phone] 02/20/2019 12:36 PM

## 2019-03-02 ENCOUNTER — Other Ambulatory Visit: Payer: Self-pay | Admitting: Oncology

## 2019-03-02 DIAGNOSIS — Z5111 Encounter for antineoplastic chemotherapy: Secondary | ICD-10-CM | POA: Diagnosis not present

## 2019-03-02 DIAGNOSIS — C61 Malignant neoplasm of prostate: Secondary | ICD-10-CM | POA: Diagnosis not present

## 2019-03-07 MED FILL — XTANDI 40 MG CAPSULE: 40 | 30 days supply | Qty: 120 | Fill #0

## 2019-03-27 ENCOUNTER — Other Ambulatory Visit: Payer: Self-pay

## 2019-03-27 ENCOUNTER — Inpatient Hospital Stay: Payer: Medicare HMO | Attending: Oncology

## 2019-03-27 DIAGNOSIS — I10 Essential (primary) hypertension: Secondary | ICD-10-CM | POA: Insufficient documentation

## 2019-03-27 DIAGNOSIS — C61 Malignant neoplasm of prostate: Secondary | ICD-10-CM | POA: Diagnosis not present

## 2019-03-27 DIAGNOSIS — Z7982 Long term (current) use of aspirin: Secondary | ICD-10-CM | POA: Insufficient documentation

## 2019-03-27 DIAGNOSIS — Z79899 Other long term (current) drug therapy: Secondary | ICD-10-CM | POA: Insufficient documentation

## 2019-03-27 DIAGNOSIS — Z923 Personal history of irradiation: Secondary | ICD-10-CM | POA: Insufficient documentation

## 2019-03-27 DIAGNOSIS — Z79818 Long term (current) use of other agents affecting estrogen receptors and estrogen levels: Secondary | ICD-10-CM | POA: Insufficient documentation

## 2019-03-27 LAB — CBC WITH DIFFERENTIAL (CANCER CENTER ONLY)
Abs Immature Granulocytes: 0.03 10*3/uL (ref 0.00–0.07)
Basophils Absolute: 0 10*3/uL (ref 0.0–0.1)
Basophils Relative: 1 %
Eosinophils Absolute: 0 10*3/uL (ref 0.0–0.5)
Eosinophils Relative: 0 %
HCT: 42.4 % (ref 39.0–52.0)
Hemoglobin: 14.1 g/dL (ref 13.0–17.0)
Immature Granulocytes: 1 %
Lymphocytes Relative: 45 %
Lymphs Abs: 2.5 10*3/uL (ref 0.7–4.0)
MCH: 31 pg (ref 26.0–34.0)
MCHC: 33.3 g/dL (ref 30.0–36.0)
MCV: 93.2 fL (ref 80.0–100.0)
Monocytes Absolute: 0.5 10*3/uL (ref 0.1–1.0)
Monocytes Relative: 10 %
Neutro Abs: 2.3 10*3/uL (ref 1.7–7.7)
Neutrophils Relative %: 43 %
Platelet Count: 224 10*3/uL (ref 150–400)
RBC: 4.55 MIL/uL (ref 4.22–5.81)
RDW: 13.2 % (ref 11.5–15.5)
WBC Count: 5.4 10*3/uL (ref 4.0–10.5)
nRBC: 0 % (ref 0.0–0.2)

## 2019-03-27 LAB — CMP (CANCER CENTER ONLY)
ALT: 16 U/L (ref 0–44)
AST: 17 U/L (ref 15–41)
Albumin: 3.8 g/dL (ref 3.5–5.0)
Alkaline Phosphatase: 119 U/L (ref 38–126)
Anion gap: 10 (ref 5–15)
BUN: 11 mg/dL (ref 8–23)
CO2: 20 mmol/L — ABNORMAL LOW (ref 22–32)
Calcium: 9.5 mg/dL (ref 8.9–10.3)
Chloride: 107 mmol/L (ref 98–111)
Creatinine: 0.85 mg/dL (ref 0.61–1.24)
GFR, Est AFR Am: >60 mL/min (ref >60)
GFR, Estimated: >60 mL/min (ref >60)
Glucose, Bld: 162 mg/dL — ABNORMAL HIGH (ref 70–99)
Potassium: 4 mmol/L (ref 3.5–5.1)
Sodium: 137 mmol/L (ref 135–145)
Total Bilirubin: 0.4 mg/dL (ref 0.3–1.2)
Total Protein: 7.4 g/dL (ref 6.5–8.1)

## 2019-03-28 LAB — PROSTATE-SPECIFIC AG, SERUM (LABCORP): Prostate Specific Ag, Serum: 2.8 ng/mL (ref 0.0–4.0)

## 2019-04-03 ENCOUNTER — Other Ambulatory Visit: Payer: Self-pay

## 2019-04-03 ENCOUNTER — Inpatient Hospital Stay (HOSPITAL_BASED_OUTPATIENT_CLINIC_OR_DEPARTMENT_OTHER): Payer: Medicare HMO | Admitting: Oncology

## 2019-04-03 VITALS — BP 146/75 | HR 90 | Temp 97.8°F | Resp 18 | Ht 69.0 in | Wt 262.8 lb

## 2019-04-03 DIAGNOSIS — C61 Malignant neoplasm of prostate: Secondary | ICD-10-CM | POA: Diagnosis not present

## 2019-04-03 DIAGNOSIS — Z79818 Long term (current) use of other agents affecting estrogen receptors and estrogen levels: Secondary | ICD-10-CM | POA: Diagnosis not present

## 2019-04-03 DIAGNOSIS — Z7982 Long term (current) use of aspirin: Secondary | ICD-10-CM | POA: Diagnosis not present

## 2019-04-03 DIAGNOSIS — Z923 Personal history of irradiation: Secondary | ICD-10-CM | POA: Diagnosis not present

## 2019-04-03 DIAGNOSIS — I10 Essential (primary) hypertension: Secondary | ICD-10-CM | POA: Diagnosis not present

## 2019-04-03 DIAGNOSIS — Z79899 Other long term (current) drug therapy: Secondary | ICD-10-CM | POA: Diagnosis not present

## 2019-04-03 NOTE — Progress Notes (Signed)
Hematology and Oncology Follow Up Visit  Bhupinder Plagens [id number] September 16, 1945 74 y.o. 04/03/2019 8:55 AM Dewaine Oats, [name], NPEubanks, [name], NP   Principle Diagnosis: 74 year old man with prostate cancer diagnosed in 2010 with localized disease.  He has developed biochemical relapse currently castration-resistant since 2017.  Prior Therapy: He was treated with external beam radiation and androgen deprivation.  He developed biochemical relapse with a PSA rise and he has been on combined androgen deprivation with [place] agonist on bicalutamide. PSA in June 2016 was 6.11, in December 2016 was 10, in June 2017 was 9 and in September 2017 was up to 12.  Staging workup including CT scan of the abdomen and pelvis as well as bone scan showed no evidence of metastatic disease.  His PSA was up to 22.5 in April 2018.   Current therapy:   Androgen Deprivation given under the care of Dr. Junious Silk [facility].  Xtandi 160 mg daily started in May 2018.  Interim History: Mr. Radach is here for return evaluation.  Since the last visit, he reports no major changes in his health.  He continues to tolerate Xtandi without any recent complaints.  Denies any nausea, vomiting abdominal pain.  Denies any bone pain pathological fractures.  Denies any recent hospitalization or illnesses.  Continues to ambulate without any difficulties at this time.  His quality of life remain excellent.               Medications: Unchanged on review. Current Outpatient Medications  Medication Sig Dispense Refill  . aspirin EC 81 MG tablet Take 81 mg by mouth daily.    Marland Kitchen atorvastatin (LIPITOR) 10 MG tablet Take 1 tablet (10 mg total) by mouth every morning. 90 tablet 1  . Psyllium (METAMUCIL PO) Take by mouth daily.    Marland Kitchen triptorelin (TRELSTAR) 11.25 MG injection Inject 11.25 mg into the muscle. Every 6 months    . XTANDI 40 MG capsule TAKE 4 CAPSULES BY MOUTH ONCE DAILY. 120 capsule 1   No current  facility-administered medications for this visit.     Allergies:  Allergies  Allergen Reactions  . Other Other (See Comments)    Patient stated,"if I touch felt (material) I get "knots" on my body."  . Peach [Prunus Persica] Other (See Comments)    Patient stated,"if I touch or eat peach fuzz, I get "knots" all over my face and mouth."        Physical Exam:   Blood pressure (!) 146/75, pulse 90, temperature 97.8 F (36.6 C), temperature source Temporal, resp. rate 18, height 5\' 9"  (1.753 m), weight 262 lb 12.8 oz (119.2 kg), SpO2 99 %.     ECOG: 0    General appearance: Comfortable appearing without any discomfort Head: Normocephalic without any trauma Oropharynx: Mucous membranes are moist and pink without any thrush or ulcers. Eyes: Pupils are equal and round reactive to light. Lymph nodes: No cervical, supraclavicular, inguinal or axillary lymphadenopathy.   Heart:regular rate and rhythm.  S1 and S2 without leg edema. Lung: Clear without any rhonchi or wheezes.  No dullness to percussion. Abdomin: Soft, nontender, nondistended with good bowel sounds.  No hepatosplenomegaly. Musculoskeletal: No joint deformity or effusion.  Full range of motion noted. Neurological: No deficits noted on motor, sensory and deep tendon reflex exam. Skin: No petechial rash or dryness.  Appeared moist.          Lab Results: Lab Results  Component Value Date   WBC 5.4 03/27/2019   HGB 14.1  03/27/2019   HCT 42.4 03/27/2019   MCV 93.2 03/27/2019   PLT 224 03/27/2019     Chemistry      Component Value Date/Time   NA 137 03/27/2019 1145   NA 137 12/15/2016 1421   K 4.0 03/27/2019 1145   K 4.4 12/15/2016 1421   CL 107 03/27/2019 1145   CO2 20 (L) 03/27/2019 1145   CO2 19 (L) 12/15/2016 1421   BUN 11 03/27/2019 1145   BUN 13.4 12/15/2016 1421   CREATININE 0.85 03/27/2019 1145   CREATININE 0.84 10/09/2018 1335   CREATININE 0.8 12/15/2016 1421      Component Value  Date/Time   CALCIUM 9.5 03/27/2019 1145   CALCIUM 9.4 12/15/2016 1421   ALKPHOS 119 03/27/2019 1145   ALKPHOS 92 12/15/2016 1421   AST 17 03/27/2019 1145   AST 17 12/15/2016 1421   ALT 16 03/27/2019 1145   ALT 12 12/15/2016 1421   BILITOT 0.4 03/27/2019 1145   BILITOT 0.37 12/15/2016 1421       Results for ERAY, COLLINSON (MRN [id number]) as of 04/03/2019 08:50  Ref. Range 11/21/2018 10:55 03/27/2019 11:45  Prostate Specific Ag, Serum Latest Ref Range: 0.0 - 4.0 ng/mL 3.1 2.8     Impression and Plan:   74 year old man with  1.  Prostate cancer diagnosed in 2010.  He subsequently developed castration-resistant with biochemical relapse in 2010.   He continues to tolerate Xtandi without any major complications.  PSA remains relatively stable with a slight decline in his PSA in February 2021.  The natural course of this disease was reviewed today as well as potential treatment options if he develops progression of disease.  Systemic chemotherapy versus switching to Zytiga or Xofigo if he develops bone metastasis.  At this time given his biochemical relapse only disease with the PSA that is stable I have recommended continuing the same dose and schedule.  He is agreeable at this time given the stability of his disease.    2. Androgen depravation: He continues to receive that under the care of Dr. Junious Silk.  I recommended continuing this indefinitely.  3.  Hypertension: Blood pressure adequate at this time I will continue to monitor on Xtandi.   4. Follow-up: He will return in 4 months for repeat evaluation.  30  minutes was dedicated to the visit today.  Time was spent on reviewing his laboratory data, disease status update as well as treatment options for the future.  Zola Button, MD 2/16/20218:55 AM

## 2019-04-04 ENCOUNTER — Telehealth: Payer: Self-pay | Admitting: Oncology

## 2019-04-04 NOTE — Telephone Encounter (Signed)
Scheduled appt per 2/16 los.  Sent a message to HIM pool to get a calendar mailed out,. 

## 2019-04-10 MED FILL — XTANDI 40 MG CAPSULE: 40 | 30 days supply | Qty: 120 | Fill #1

## 2019-05-03 ENCOUNTER — Other Ambulatory Visit: Payer: Self-pay | Admitting: Oncology

## 2019-05-03 DIAGNOSIS — C61 Malignant neoplasm of prostate: Secondary | ICD-10-CM

## 2019-05-09 MED FILL — XTANDI 40 MG CAPSULE: 40 | 30 days supply | Qty: 120 | Fill #0

## 2019-05-21 ENCOUNTER — Other Ambulatory Visit: Payer: Self-pay

## 2019-05-21 DIAGNOSIS — E782 Mixed hyperlipidemia: Secondary | ICD-10-CM

## 2019-05-21 MED ORDER — ATORVASTATIN CALCIUM 10 MG PO TABS: 10.0000 mg | ORAL_TABLET | Freq: Every morning | ORAL | 1 refills | Status: DC

## 2019-05-21 NOTE — Telephone Encounter (Signed)
Patient called and requested refill on medication "Atorvastatin" Patient has been seen in office 01/10/2019 and has had labs up to date. Patient has upcoming appointment 07/23/2019.

## 2019-06-05 MED FILL — XTANDI 40 MG CAPSULE: 40 | 30 days supply | Qty: 120 | Fill #1

## 2019-07-02 ENCOUNTER — Other Ambulatory Visit: Payer: Self-pay | Admitting: Oncology

## 2019-07-02 DIAGNOSIS — C61 Malignant neoplasm of prostate: Secondary | ICD-10-CM

## 2019-07-03 MED FILL — XTANDI 40 MG CAPSULE: 40 | 30 days supply | Qty: 120 | Fill #0

## 2019-07-11 ENCOUNTER — Ambulatory Visit: Payer: Medicare HMO | Admitting: Nurse Practitioner

## 2019-07-23 ENCOUNTER — Ambulatory Visit: Payer: Medicare HMO | Admitting: Nurse Practitioner

## 2019-07-30 ENCOUNTER — Other Ambulatory Visit: Payer: Self-pay | Admitting: Oncology

## 2019-07-30 DIAGNOSIS — C61 Malignant neoplasm of prostate: Secondary | ICD-10-CM

## 2019-07-31 ENCOUNTER — Inpatient Hospital Stay: Payer: Medicare HMO | Attending: Oncology

## 2019-07-31 ENCOUNTER — Other Ambulatory Visit: Payer: Self-pay

## 2019-07-31 DIAGNOSIS — C61 Malignant neoplasm of prostate: Secondary | ICD-10-CM | POA: Diagnosis not present

## 2019-07-31 DIAGNOSIS — Z79899 Other long term (current) drug therapy: Secondary | ICD-10-CM | POA: Diagnosis not present

## 2019-07-31 DIAGNOSIS — I10 Essential (primary) hypertension: Secondary | ICD-10-CM | POA: Insufficient documentation

## 2019-07-31 DIAGNOSIS — Z923 Personal history of irradiation: Secondary | ICD-10-CM | POA: Insufficient documentation

## 2019-07-31 DIAGNOSIS — Z7982 Long term (current) use of aspirin: Secondary | ICD-10-CM | POA: Insufficient documentation

## 2019-07-31 LAB — CBC WITH DIFFERENTIAL (CANCER CENTER ONLY)
Abs Immature Granulocytes: 0.03 10*3/uL (ref 0.00–0.07)
Basophils Absolute: 0 10*3/uL (ref 0.0–0.1)
Basophils Relative: 1 %
Eosinophils Absolute: 0 10*3/uL (ref 0.0–0.5)
Eosinophils Relative: 0 %
HCT: 41.9 % (ref 39.0–52.0)
Hemoglobin: 13.9 g/dL (ref 13.0–17.0)
Immature Granulocytes: 1 %
Lymphocytes Relative: 43 %
Lymphs Abs: 2.7 10*3/uL (ref 0.7–4.0)
MCH: 31 pg (ref 26.0–34.0)
MCHC: 33.2 g/dL (ref 30.0–36.0)
MCV: 93.3 fL (ref 80.0–100.0)
Monocytes Absolute: 0.7 10*3/uL (ref 0.1–1.0)
Monocytes Relative: 12 %
Neutro Abs: 2.7 10*3/uL (ref 1.7–7.7)
Neutrophils Relative %: 43 %
Platelet Count: 229 10*3/uL (ref 150–400)
RBC: 4.49 MIL/uL (ref 4.22–5.81)
RDW: 13.2 % (ref 11.5–15.5)
WBC Count: 6.1 10*3/uL (ref 4.0–10.5)
nRBC: 0 % (ref 0.0–0.2)

## 2019-07-31 LAB — CMP (CANCER CENTER ONLY)
ALT: 14 U/L (ref 0–44)
AST: 16 U/L (ref 15–41)
Albumin: 3.8 g/dL (ref 3.5–5.0)
Alkaline Phosphatase: 113 U/L (ref 38–126)
Anion gap: 11 (ref 5–15)
BUN: 13 mg/dL (ref 8–23)
CO2: 20 mmol/L — ABNORMAL LOW (ref 22–32)
Calcium: 9.6 mg/dL (ref 8.9–10.3)
Chloride: 108 mmol/L (ref 98–111)
Creatinine: 1.1 mg/dL (ref 0.61–1.24)
GFR, Est AFR Am: >60 mL/min (ref >60)
GFR, Estimated: >60 mL/min (ref >60)
Glucose, Bld: 168 mg/dL — ABNORMAL HIGH (ref 70–99)
Potassium: 4.5 mmol/L (ref 3.5–5.1)
Sodium: 139 mmol/L (ref 135–145)
Total Bilirubin: 0.5 mg/dL (ref 0.3–1.2)
Total Protein: 7.4 g/dL (ref 6.5–8.1)

## 2019-08-01 LAB — PROSTATE-SPECIFIC AG, SERUM (LABCORP): Prostate Specific Ag, Serum: 3.2 ng/mL (ref 0.0–4.0)

## 2019-08-02 ENCOUNTER — Other Ambulatory Visit: Payer: Self-pay

## 2019-08-02 ENCOUNTER — Inpatient Hospital Stay (HOSPITAL_BASED_OUTPATIENT_CLINIC_OR_DEPARTMENT_OTHER): Payer: Medicare HMO | Admitting: Oncology

## 2019-08-02 VITALS — BP 141/77 | HR 80 | Temp 97.8°F | Resp 18 | Ht 69.0 in | Wt 265.5 lb

## 2019-08-02 DIAGNOSIS — Z7982 Long term (current) use of aspirin: Secondary | ICD-10-CM | POA: Diagnosis not present

## 2019-08-02 DIAGNOSIS — C61 Malignant neoplasm of prostate: Secondary | ICD-10-CM

## 2019-08-02 DIAGNOSIS — I10 Essential (primary) hypertension: Secondary | ICD-10-CM | POA: Diagnosis not present

## 2019-08-02 DIAGNOSIS — Z79899 Other long term (current) drug therapy: Secondary | ICD-10-CM | POA: Diagnosis not present

## 2019-08-02 DIAGNOSIS — Z923 Personal history of irradiation: Secondary | ICD-10-CM | POA: Diagnosis not present

## 2019-08-02 NOTE — Progress Notes (Signed)
Hematology and Oncology Follow Up Visit  Samuel [name] [id number] 06/20/1945 74 y.o. 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  He was initially diagnosed in 2010 with localized disease.    Prior Therapy: He was treated with external beam radiation and androgen deprivation.  He developed biochemical relapse with a PSA rise and he has been on combined androgen deprivation with [place] agonist on bicalutamide. PSA in June 2016 was 6.11, in December 2016 was 10, in June 2017 was 9 and in September 2017 was up to 12.  Staging workup including CT scan of the abdomen and pelvis as well as bone scan showed no evidence of metastatic disease.  His PSA was up to 22.5 in April 2018.   Current therapy:   Androgen Deprivation given under the care of Dr. Junious Silk [facility].  Xtandi 160 mg daily started in May 2018.  Interim History: Samuel [name] is here for a follow-up evaluation.  Since the last visit, he continues to tolerate Xtandi without any recent complaints.  He denies any nausea, vomiting or abdominal pain.  He denies any bone pain or pathological fractures.  He denies any recent hospitalizations or illnesses.  Performance status and quality of life is maintained.               Medications: Reviewed without changes. Current Outpatient Medications  Medication Sig Dispense Refill  . aspirin EC 81 MG tablet Take 81 mg by mouth daily.    Marland Kitchen atorvastatin (LIPITOR) 10 MG tablet Take 1 tablet (10 mg total) by mouth every morning. 90 tablet 1  . Psyllium (METAMUCIL PO) Take by mouth daily.    Marland Kitchen triptorelin (TRELSTAR) 11.25 MG injection Inject 11.25 mg into the muscle. Every 6 months    . XTANDI 40 MG capsule TAKE 4 CAPSULES BY MOUTH ONCE DAILY. 120 capsule 1   No current facility-administered medications for this visit.     Allergies:  Allergies   Allergen Reactions  . Other Other (See Comments)    Patient stated,"if I touch felt (material) I get "knots" on my body."  . Peach [Prunus Persica] Other (See Comments)    Patient stated,"if I touch or eat peach fuzz, I get "knots" all over my face and mouth."        Physical Exam:   Blood pressure (!) 141/77, pulse 80, temperature 97.8 F (36.6 C), temperature source Temporal, resp. rate 18, height 5\' 9"  (1.753 m), weight 265 lb 8 oz (120.4 kg), SpO2 100 %.     ECOG: 0   General appearance: Alert, awake without any distress. Head: Atraumatic without abnormalities Oropharynx: Without any thrush or ulcers. Eyes: No scleral icterus. Lymph nodes: No lymphadenopathy noted in the cervical, supraclavicular, or axillary nodes Heart:regular rate and rhythm, without any murmurs or gallops.   Lung: Clear to auscultation without any rhonchi, wheezes or dullness to percussion. Abdomin: Soft, nontender without any shifting dullness or ascites. Musculoskeletal: No clubbing or cyanosis. Neurological: No motor or sensory deficits. Skin: No rashes or lesions.          Lab Results: Lab Results  Component Value Date   WBC 6.1 07/31/2019   HGB 13.9 07/31/2019   HCT 41.9 07/31/2019   MCV 93.3 07/31/2019   PLT 229 07/31/2019     Chemistry      Component Value Date/Time   NA 139 07/31/2019 1050   NA 137 12/15/2016  1421   K 4.5 07/31/2019 1050   K 4.4 12/15/2016 1421   CL 108 07/31/2019 1050   CO2 20 (L) 07/31/2019 1050   CO2 19 (L) 12/15/2016 1421   BUN 13 07/31/2019 1050   BUN 13.4 12/15/2016 1421   CREATININE 1.10 07/31/2019 1050   CREATININE 0.84 10/09/2018 1335   CREATININE 0.8 12/15/2016 1421      Component Value Date/Time   CALCIUM 9.6 07/31/2019 1050   CALCIUM 9.4 12/15/2016 1421   ALKPHOS 113 07/31/2019 1050   ALKPHOS 92 12/15/2016 1421   AST 16 07/31/2019 1050   AST 17 12/15/2016 1421   ALT 14 07/31/2019 1050   ALT 12 12/15/2016 1421   BILITOT 0.5  07/31/2019 1050   BILITOT 0.37 12/15/2016 1421        Results for Samuel [name], Samuel [name] (MRN [id number]) as of 08/02/2019 09:24  Ref. Range 03/27/2019 11:45 07/31/2019 10:50  Prostate Specific Ag, Serum Latest Ref Range: 0.0 - 4.0 ng/mL 2.8 3.2     Impression and Plan:   74 year old man with  1.  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    He continues to be on Xtandi without any recent complications at this time.  His PSA from June 15 continues to be overall stable for the last 3 years.  Risks and benefits of continuing this treatment were reviewed.  Potential complication occluding hypertension, hematuria and rarely seizures.  Alternative options would include systemic chemo therapy among others if he develops measurable disease.    2. Androgen depravation: I recommended continuing this indefinitely.  He is up-to-date the care of Dr. Junious Silk.  3.  Hypertension: No issues reported with blood pressure on Xtandi.  Amended continued monitoring.  4.  Covid vaccination considerations: He is up-to-date and completed his vaccination series.   5. Follow-up: He will return in 4 months for repeat evaluation.  30  minutes were spent on this encounter today.  Time was dedicated to reviewing laboratory data, disease status update, addressing complication related therapy and alternative treatment choices for the future.  Zola Button, MD 6/17/20219:29 AM

## 2019-08-03 ENCOUNTER — Telehealth: Payer: Self-pay | Admitting: Oncology

## 2019-08-03 NOTE — Telephone Encounter (Signed)
Scheduled appt per 6/17 los.  Spoke with pt and they are aware of the appt date and time.

## 2019-08-06 MED FILL — XTANDI 40 MG CAPSULE: 40 | 30 days supply | Qty: 120 | Fill #1

## 2019-09-03 DIAGNOSIS — R3912 Poor urinary stream: Secondary | ICD-10-CM | POA: Diagnosis not present

## 2019-09-03 DIAGNOSIS — Z5111 Encounter for antineoplastic chemotherapy: Secondary | ICD-10-CM | POA: Diagnosis not present

## 2019-09-03 DIAGNOSIS — C61 Malignant neoplasm of prostate: Secondary | ICD-10-CM | POA: Diagnosis not present

## 2019-09-05 ENCOUNTER — Other Ambulatory Visit: Payer: Self-pay | Admitting: Oncology

## 2019-09-05 DIAGNOSIS — C61 Malignant neoplasm of prostate: Secondary | ICD-10-CM

## 2019-09-10 MED FILL — XTANDI 40 MG CAPSULE: 40 | 30 days supply | Qty: 120 | Fill #0

## 2019-09-26 ENCOUNTER — Other Ambulatory Visit: Payer: Self-pay | Admitting: Urology

## 2019-09-26 DIAGNOSIS — Z192 Hormone resistant malignancy status: Secondary | ICD-10-CM

## 2019-09-26 DIAGNOSIS — C61 Malignant neoplasm of prostate: Secondary | ICD-10-CM

## 2019-10-01 ENCOUNTER — Ambulatory Visit
Admission: RE | Admit: 2019-10-01 | Discharge: 2019-10-01 | Disposition: A | Discharge status: Home / Self Care | Payer: Medicare HMO | Source: Ambulatory Visit | Attending: Urology | Admitting: Urology

## 2019-10-01 ENCOUNTER — Other Ambulatory Visit: Payer: Self-pay

## 2019-10-01 DIAGNOSIS — Z192 Hormone resistant malignancy status: Secondary | ICD-10-CM

## 2019-10-01 DIAGNOSIS — M85851 Other specified disorders of bone density and structure, right thigh: Secondary | ICD-10-CM | POA: Diagnosis not present

## 2019-10-01 DIAGNOSIS — C61 Malignant neoplasm of prostate: Secondary | ICD-10-CM

## 2019-10-08 MED FILL — XTANDI 40 MG CAPSULE: 40 | 30 days supply | Qty: 120 | Fill #1

## 2019-10-18 ENCOUNTER — Encounter: Payer: Medicare HMO | Admitting: Nurse Practitioner

## 2019-10-30 ENCOUNTER — Telehealth: Payer: Self-pay

## 2019-10-30 ENCOUNTER — Other Ambulatory Visit: Payer: Self-pay | Admitting: Nurse Practitioner

## 2019-10-30 ENCOUNTER — Encounter: Payer: Self-pay | Admitting: Nurse Practitioner

## 2019-10-30 ENCOUNTER — Ambulatory Visit (INDEPENDENT_AMBULATORY_CARE_PROVIDER_SITE_OTHER): Payer: Medicare HMO | Admitting: Nurse Practitioner

## 2019-10-30 ENCOUNTER — Other Ambulatory Visit: Payer: Self-pay

## 2019-10-30 DIAGNOSIS — Z0000 Encounter for general adult medical examination without abnormal findings: Secondary | ICD-10-CM

## 2019-10-30 DIAGNOSIS — E782 Mixed hyperlipidemia: Secondary | ICD-10-CM

## 2019-10-30 DIAGNOSIS — E1141 Type 2 diabetes mellitus with diabetic mononeuropathy: Secondary | ICD-10-CM

## 2019-10-30 NOTE — Patient Instructions (Signed)
[name] , Thank you for taking time to come for your Medicare Wellness Visit. I appreciate your ongoing commitment to your health goals. Please review the following plan we discussed and let me know if I can assist you in the future.   Screening recommendations/referrals: Colonoscopy- due for cologuard in 2023 Recommended yearly ophthalmology/optometry visit for glaucoma screening and checkup Recommended yearly dental visit for hygiene and checkup  Vaccinations: Influenza vaccine recommended Pneumococcal vaccine recommended Tdap vaccine recommended Shingles vaccine -NA    Advanced directives: please look over MOST form we will discuss at your next office visit.  Also recommend to complete advance directives and have notarized.   Conditions/risks identified: obesity, hyperlipidemia, diabetes.   Next appointment: 1 year for AWV, overdue for follow up- to schedule.   Preventive Care 75 Years and Older, Male Preventive care refers to lifestyle choices and visits with your health care provider that can promote health and wellness. What does preventive care include?  A yearly physical exam. This is also called an annual well check.  Dental exams once or twice a year.  Routine eye exams. Ask your health care provider how often you should have your eyes checked.  Personal lifestyle choices, including:  Daily care of your teeth and gums.  Regular physical activity.  Eating a healthy diet.  Avoiding tobacco and drug use.  Limiting alcohol use.  Practicing safe sex.  Taking low doses of aspirin every day.  Taking vitamin and mineral supplements as recommended by your health care provider. What happens during an annual well check? The services and screenings done by your health care provider during your annual well check will depend on your age, overall health, lifestyle risk factors, and family history of disease. Counseling  Your health care provider may ask you questions  about your:  Alcohol use.  Tobacco use.  Drug use.  Emotional well-being.  Home and relationship well-being.  Sexual activity.  Eating habits.  History of falls.  Memory and ability to understand (cognition).  Work and work Statistician. Screening  You may have the following tests or measurements:  Height, weight, and BMI.  Blood pressure.  Lipid and cholesterol levels. These may be checked every 5 years, or more frequently if you are over 26 years old.  Skin check.  Lung cancer screening. You may have this screening every year starting at age 75 if you have a 30-pack-year history of smoking and currently smoke or have quit within the past 15 years.  Fecal occult blood test (FOBT) of the stool. You may have this test every year starting at age 75.  Flexible sigmoidoscopy or colonoscopy. You may have a sigmoidoscopy every 5 years or a colonoscopy every 10 years starting at age 75.  Prostate cancer screening. Recommendations will vary depending on your family history and other risks.  Hepatitis C blood test.  Hepatitis B blood test.  Sexually transmitted disease (STD) testing.  Diabetes screening. This is done by checking your blood sugar (glucose) after you have not eaten for a while (fasting). You may have this done every 1-3 years.  Abdominal aortic aneurysm (AAA) screening. You may need this if you are a current or former smoker.  Osteoporosis. You may be screened starting at age 75 if you are at high risk. Talk with your health care provider about your test results, treatment options, and if necessary, the need for more tests. Vaccines  Your health care provider may recommend certain vaccines, such as:  Influenza vaccine. This  is recommended every year.  Tetanus, diphtheria, and acellular pertussis (Tdap, Td) vaccine. You may need a Td booster every 10 years.  Zoster vaccine. You may need this after age 75.  Pneumococcal 13-valent conjugate (PCV13)  vaccine. One dose is recommended after age 79.  Pneumococcal polysaccharide (PPSV23) vaccine. One dose is recommended after age 75. Talk to your health care provider about which screenings and vaccines you need and how often you need them. This information is not intended to replace advice given to you by your health care provider. Make sure you discuss any questions you have with your health care provider. Document Released: 02/28/2015 Document Revised: 10/22/2015 Document Reviewed: 12/03/2014 Elsevier Interactive Patient Education  2017 [place] Prevention in the Home Falls can cause injuries. They can happen to people of all ages. There are many things you can do to make your home safe and to help prevent falls. What can I do on the outside of my home?  Regularly fix the edges of walkways and driveways and fix any cracks.  Remove anything that might make you trip as you walk through a door, such as a raised step or threshold.  Trim any bushes or trees on the path to your home.  Use bright outdoor lighting.  Clear any walking paths of anything that might make someone trip, such as rocks or tools.  Regularly check to see if handrails are loose or broken. Make sure that both sides of any steps have handrails.  Any raised decks and porches should have guardrails on the edges.  Have any leaves, snow, or ice cleared regularly.  Use sand or salt on walking paths during winter.  Clean up any spills in your garage right away. This includes oil or grease spills. What can I do in the bathroom?  Use night lights.  Install grab bars by the toilet and in the tub and shower. Do not use towel bars as grab bars.  Use non-skid mats or decals in the tub or shower.  If you need to sit down in the shower, use a plastic, non-slip stool.  Keep the floor dry. Clean up any water that spills on the floor as soon as it happens.  Remove soap buildup in the tub or shower  regularly.  Attach bath mats securely with double-sided non-slip rug tape.  Do not have throw rugs and other things on the floor that can make you trip. What can I do in the bedroom?  Use night lights.  Make sure that you have a light by your bed that is easy to reach.  Do not use any sheets or blankets that are too big for your bed. They should not hang down onto the floor.  Have a firm chair that has side arms. You can use this for support while you get dressed.  Do not have throw rugs and other things on the floor that can make you trip. What can I do in the kitchen?  Clean up any spills right away.  Avoid walking on wet floors.  Keep items that you use a lot in easy-to-reach places.  If you need to reach something above you, use a strong step stool that has a grab bar.  Keep electrical cords out of the way.  Do not use floor polish or wax that makes floors slippery. If you must use wax, use non-skid floor wax.  Do not have throw rugs and other things on the floor that can make you  trip. What can I do with my stairs?  Do not leave any items on the stairs.  Make sure that there are handrails on both sides of the stairs and use them. Fix handrails that are broken or loose. Make sure that handrails are as long as the stairways.  Check any carpeting to make sure that it is firmly attached to the stairs. Fix any carpet that is loose or worn.  Avoid having throw rugs at the top or bottom of the stairs. If you do have throw rugs, attach them to the floor with carpet tape.  Make sure that you have a light switch at the top of the stairs and the bottom of the stairs. If you do not have them, ask someone to add them for you. What else can I do to help prevent falls?  Wear shoes that:  Do not have high heels.  Have rubber bottoms.  Are comfortable and fit you well.  Are closed at the toe. Do not wear sandals.  If you use a stepladder:  Make sure that it is fully  opened. Do not climb a closed stepladder.  Make sure that both sides of the stepladder are locked into place.  Ask someone to hold it for you, if possible.  Clearly mark and make sure that you can see:  Any grab bars or handrails.  First and last steps.  Where the edge of each step is.  Use tools that help you move around (mobility aids) if they are needed. These include:  Canes.  Walkers.  Scooters.  Crutches.  Turn on the lights when you go into a dark area. Replace any light bulbs as soon as they burn out.  Set up your furniture so you have a clear path. Avoid moving your furniture around.  If any of your floors are uneven, fix them.  If there are any pets around you, be aware of where they are.  Review your medicines with your doctor. Some medicines can make you feel dizzy. This can increase your chance of falling. Ask your doctor what other things that you can do to help prevent falls. This information is not intended to replace advice given to you by your health care provider. Make sure you discuss any questions you have with your health care provider. Document Released: 11/28/2008 Document Revised: 07/10/2015 Document Reviewed: 03/08/2014 Elsevier Interactive Patient Education  2017 [name].

## 2019-10-30 NOTE — Telephone Encounter (Signed)
Mr. dickie, cloe are scheduled for a virtual visit with your provider today.    Just as we do with appointments in the office, we must obtain your consent to participate.  Your consent will be active for this visit and any virtual visit you may have with one of our providers in the next 365 days.    If you have a MyChart account, I can also send a copy of this consent to you electronically.  All virtual visits are billed to your insurance company just like a traditional visit in the office.  As this is a virtual visit, video technology does not allow for your provider to perform a traditional examination.  This may limit your provider's ability to fully assess your condition.  If your provider identifies any concerns that need to be evaluated in person or the need to arrange testing such as labs, EKG, etc, we will make arrangements to do so.    Although advances in technology are sophisticated, we cannot ensure that it will always work on either your end or our end.  If the connection with a video visit is poor, we may have to switch to a telephone visit.  With either a video or telephone visit, we are not always able to ensure that we have a secure connection.   I need to obtain your verbal consent now.   Are you willing to proceed with your visit today?   Kaylee Wombles has provided verbal consent on 10/30/2019 for a virtual visit (video or telephone).   Carroll Kinds, CMA 10/30/2019  9:25 AM

## 2019-10-30 NOTE — Progress Notes (Signed)
Subjective:   Samuel [name] is a 74 y.o. male who presents for Medicare Annual/Subsequent preventive examination.  Review of Systems    *       Objective:    There were no vitals filed for this visit. There is no height or weight on file to calculate BMI.  Advanced Directives 10/30/2019 01/10/2019 04/10/2018 10/14/2017 05/11/2017 10/08/2016 06/02/2016  Does Patient Have a Medical Advance Directive? No No No No No No No  Would patient like information on creating a medical advance directive? - Yes (MAU/Ambulatory/Procedural Areas - Information given) No - Patient declined Yes (MAU/Ambulatory/Procedural Areas - Information given) No - Patient declined Yes (MAU/Ambulatory/Procedural Areas - Information given) -    Current Medications (verified) Outpatient Encounter Medications as of 10/30/2019  Medication Sig  . aspirin EC 325 MG tablet Take 325 mg by mouth daily.   Marland Kitchen atorvastatin (LIPITOR) 10 MG tablet Take 1 tablet (10 mg total) by mouth every morning.  . Psyllium (METAMUCIL PO) Take by mouth daily. As needed  . triptorelin (TRELSTAR) 11.25 MG injection Inject 11.25 mg into the muscle. Every 6 months  . XTANDI 40 MG capsule TAKE 4 CAPSULES BY MOUTH ONCE DAILY.   No facility-administered encounter medications on file as of 10/30/2019.    Allergies (verified) Other and Peach [prunus persica]   History: Past Medical History:  Diagnosis Date  . Arthritis   . Blind left eye   . Cyst in hand 10/2013   left hand, treated with antibiotic & pain medicine  . Diabetes mellitus without complication ([place])    ?? borderline  being checked- no medications  . Heart murmur   . Hepatitis A    25 YRS AGO  . Hyperlipidemia   . Prostate cancer ([place])   . Sleep apnea    ????   O2 DESAT  10/26/13  IN ED , no sleep study ever done per pt- no cpap   Past Surgical History:  Procedure Laterality Date  . COLONOSCOPY    . EYE SURGERY     LEFT AYE AS CHILD   . JOINT REPLACEMENT Bilateral    LT KNEE 3  YRS AGO   . 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 TOTAL KNEE ARTHROPLASTY Right 12/04/2013   Procedure: TOTAL KNEE ARTHROPLASTY;  Surgeon: Hessie Dibble, MD;  Location: [facility];  Service: Orthopedics;  Laterality: Right;   Family History  Problem Relation Age of Onset  . Stroke Mother   . Heart attack Mother   . Diabetes Mother   . Stroke Father   . Stomach cancer Brother   . Prostate cancer Brother   . Throat cancer Sister   . Colon cancer Neg Hx   . Esophageal cancer Neg Hx   . Rectal cancer Neg Hx    Social History   Socioeconomic History  . Marital status: Widowed    Spouse name: Not on file  . Number of children: Not on file  . Years of education: Not on file  . Highest education level: Not on file  Occupational History  . Not on file  Tobacco Use  . Smoking status: Former Smoker    Packs/day: 0.50    Years: 5.00    Pack years: 2.50    Types: Cigarettes    Quit date: 02/16/1983    Years since quitting: 36.7  . Smokeless tobacco: Never Used  Vaping Use  . Vaping Use: Never used  Substance and Sexual Activity  .  Alcohol use: No    Alcohol/week: 0.0 standard drinks  . Drug use: No  . Sexual activity: Not on file  Other Topics Concern  . 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 Difficulty of Paying Living Expenses: Not on file  Food Insecurity:   . Worried About Ship broker in the Last Year: Not on file  . Ran Out of Food in the Last Year: Not on file  Transportation Needs:   . Lack of Transportation (Medical): Not on file  . Lack of Transportation (Non-Medical): Not on file  Physical Activity:   . Days of Exercise per Week: Not on file  . Minutes of Exercise per Session: Not on file  Stress:   . Feeling of Stress : Not on file  Social Connections:   . Frequency of Communication with Friends and Family: Not on file  . Frequency of Social Gatherings with Friends and Family: Not on file  . Attends Religious Services: Not on file  . Active Member of Clubs or Organizations: Not on file  . Attends Archivist Meetings: Not on file  . Marital Status: Not on file    Tobacco Counseling Counseling given: Not Answered   Clinical Intake:  Pre-visit preparation completed: Yes  Pain : No/denies pain     BMI - recorded: 37 Nutritional Status: BMI > 30  Obese Nutritional Risks: None Diabetes: Yes CBG done?: No Did pt. bring in CBG monitor from home?: No  How often do you need to have someone help you when you read instructions, pamphlets, or other written materials from your doctor or pharmacy?: 1 - Never  Diabetic?no         Activities of Daily Living No flowsheet data found.  Patient Care Team: [name], NP as PCP - General (Geriatric Medicine) Festus Aloe, MD as Consulting Physician (Urology)  Indicate any recent Medical Services you may have received from other than [facility] providers in the past year (date may be approximate).     Assessment:   This is a routine wellness examination for Elder.  Hearing/Vision screen  Hearing Screening   125Hz  250Hz  500Hz  1000Hz  2000Hz  3000Hz  4000Hz  6000Hz  8000Hz   Right ear:           Left ear:           Comments: Patient has no hearing problems  Vision Screening Comments: Patient has no vision problems. He has not had a recent eye exam  Dietary issues and exercise  activities discussed:    Goals    . Weight (lb) < 200 lb (90.7 kg)     Would like to  lose weight by increase in activity and eating better.       Depression Screen PHQ 2/9 Scores 10/30/2019 01/10/2019 10/16/2018 10/14/2017 05/11/2017 10/13/2016 10/08/2016  PHQ - 2 Score 0 0 0 0 0 0 0    Fall Risk Fall Risk  10/30/2019 01/10/2019 10/16/2018 04/10/2018 05/11/2017  Falls in the past year? 0 0 0 0 No  Number falls in past yr: 0 0 0 0 -  Injury with Fall? 0 0 0 0 -    Any stairs in or around the home? No  If so, are there any without handrails? No  Home free of loose throw rugs in walkways, pet beds, electrical cords, etc? Yes  Adequate lighting in your home to reduce risk of falls? Yes   ASSISTIVE DEVICES UTILIZED TO PREVENT FALLS:  Life alert? No  Use of a cane, walker or w/c? No  Grab bars in the bathroom? No  Shower chair or bench in shower? Yes  Elevated toilet seat or a handicapped toilet? No   TIMED UP AND GO:  Was the test performed? No .    Cognitive Function: MMSE - Mini Mental State Exam 10/14/2017 10/08/2016 05/21/2015  Not completed: - - (No Data)  Orientation to time 5 4 5   Orientation to Place 5 5 5   Registration 3 3 3   Attention/ Calculation 5 5 5   Recall 3 1 1   Language- name 2 objects 2 2 2   Language- repeat 1 1 1   Language- follow 3 step command 3 3 3   Language- read & follow direction 1 1 1   Write a sentence 1 1 1   Copy design 1 1 1   Total score 30 27 28      6CIT Screen 10/30/2019 10/16/2018  What Year? 0 points 0 points  What month? 0 points 0 points  What time? 0 points 0 points  Count back from 20 0 points 0 points  Months in reverse 0 points 2 points  Repeat phrase 0 points 6 points  Total Score 0 8    Immunizations Immunization History  Administered Date(s) Administered  . Moderna SARS-COVID-2 Vaccination 04/16/2019, 05/14/2019    TDAP status: Due, Education has been provided regarding the importance of this vaccine. Advised may receive this  vaccine at local pharmacy or Health Dept. Aware to provide a copy of the vaccination record if obtained from local pharmacy or Health Dept. Verbalized acceptance and understanding. Flu Vaccine status: Declined, Education has been provided regarding the importance of this vaccine but patient still declined. Advised may receive this vaccine at local pharmacy or Health Dept. Aware to provide a copy of the vaccination record if obtained from local pharmacy or Health Dept. Verbalized acceptance and understanding. Pneumococcal vaccine status: Declined,  Education has been provided regarding the importance of this vaccine but patient still declined. Advised may receive this vaccine at local pharmacy or Health Dept. Aware to provide a copy of the vaccination record if obtained from local pharmacy or Health Dept. Verbalized acceptance and understanding.  Covid-19 vaccine status: Completed vaccines  Qualifies for Shingles Vaccine? Yes   Zostavax completed No   Shingrix Completed?: No.    Education has been provided regarding the importance of this vaccine. Patient has been advised to call insurance company to determine out of pocket expense if they have not yet received this vaccine. Advised may also receive vaccine at local pharmacy or Health Dept. Verbalized acceptance and understanding.  Screening Tests Health Maintenance  Topic Date Due  . PNA vac  Low Risk Adult (1 of 2 - PCV13) Never done  . OPHTHALMOLOGY EXAM  05/11/2018  . URINE MICROALBUMIN  10/15/2018  . FOOT EXAM  04/11/2019  . HEMOGLOBIN A1C  04/11/2019  . TETANUS/TDAP  04/10/2020 (Originally 03/26/1964)  . INFLUENZA VACCINE  02/16/2048 (Originally 09/16/2019)  . Fecal DNA (Cologuard)  01/21/2022  . COVID-19 Vaccine  Completed  . Hepatitis C Screening  Completed    Health Maintenance  Health Maintenance Due  Topic Date Due  . PNA vac Low Risk Adult (1 of 2 - PCV13) Never done  . OPHTHALMOLOGY EXAM  05/11/2018  . URINE MICROALBUMIN   10/15/2018  . FOOT EXAM  04/11/2019  . HEMOGLOBIN A1C  04/11/2019    Colorectal cancer screening: Completed 2020. Repeat every 3 years  Lung Cancer Screening: (Low Dose CT Chest recommended if Age 92-80 years, 30 pack-year currently smoking OR have quit w/in 15years.) does not qualify.   Lung Cancer Screening Referral: na  Additional Screening:  Hepatitis C Screening: does qualify; Completed 2020   Vision Screening: Recommended annual ophthalmology exams for early detection of glaucoma and other disorders of the eye. Is the patient up to date with their annual eye exam?  No  Who is the provider or what is the name of the office in which the patient attends annual eye exams? [facility] If pt is not established with a provider, would they like to be referred to a provider to establish care? No .   Dental Screening: Recommended annual dental exams for proper oral hygiene  Community Resource Referral / Chronic Care Management: CRR required this visit?  No   CCM required this visit?  No      Plan:     I have personally reviewed and noted the following in the patient's chart:   . Medical and social history . Use of alcohol, tobacco or illicit drugs  . Current medications and supplements . Functional ability and status . Nutritional status . Physical activity . Advanced directives . List of other physicians . Hospitalizations, surgeries, and ER visits in previous 12 months . Vitals . Screenings to include cognitive, depression, and falls . Referrals and appointments  In addition, I have reviewed and discussed with patient certain preventive protocols, quality metrics, and best practice recommendations. A written personalized care plan for preventive services as well as general preventive health recommendations were provided to patient.     [name], NP   10/30/2019    Virtual Visit via Telephone Note  I connected with@ on 10/30/19 at 10:30 AM EDT by telephone and  verified that I am speaking with the correct person using two identifiers.  Location: Patient: home Provider: [facility]   I discussed the limitations, risks, security and privacy concerns of performing an evaluation and management service by telephone and the availability of in person appointments. I also discussed with the patient that there may be a patient responsible charge related to this service. The patient expressed understanding and agreed to proceed.   I discussed the assessment and treatment plan with the patient. The patient was provided an opportunity to ask questions and all were answered. The patient agreed with the plan and demonstrated an understanding of the instructions.   The patient was advised to call back or seek an in-person evaluation if the symptoms worsen or if the condition fails to improve as anticipated.  I provided 18 minutes of non-face-to-face time during this encounter.  [name]. Dewaine Oats, [place] printed and  mailed

## 2019-10-30 NOTE — Progress Notes (Signed)
This service is provided via telemedicine  No vital signs collected/recorded due to the encounter was a telemedicine visit.   Location of patient (ex: home, work):  Home  Patient consents to a telephone visit:  Yes, see encounter dated 10/30/2019  Location of the provider (ex: office, home):  [place]  Name of any referring provider:  N/A  Names of all persons participating in the telemedicine service and their role in the encounter:  Sherrie Mustache, Nurse Practitioner, Carroll Kinds, CMA, and patient.   Time spent on call:  7 minutes with medical assistant

## 2019-10-31 ENCOUNTER — Other Ambulatory Visit: Payer: Medicare HMO

## 2019-10-31 ENCOUNTER — Other Ambulatory Visit: Payer: Self-pay | Admitting: Oncology

## 2019-10-31 ENCOUNTER — Other Ambulatory Visit: Payer: Self-pay

## 2019-10-31 DIAGNOSIS — C61 Malignant neoplasm of prostate: Secondary | ICD-10-CM

## 2019-10-31 DIAGNOSIS — E782 Mixed hyperlipidemia: Secondary | ICD-10-CM | POA: Diagnosis not present

## 2019-10-31 DIAGNOSIS — E1141 Type 2 diabetes mellitus with diabetic mononeuropathy: Secondary | ICD-10-CM | POA: Diagnosis not present

## 2019-11-01 LAB — LIPID PANEL
Cholesterol: 183 mg/dL (ref <200)
HDL: 78 mg/dL (ref >=40)
LDL Cholesterol (Calc): 88 mg/dL (calc)
Non-HDL Cholesterol (Calc): 105 mg/dL (calc) (ref <130)
Total CHOL/HDL Ratio: 2.3 (calc) (ref <5.0)
Triglycerides: 84 mg/dL (ref <150)

## 2019-11-01 LAB — COMPLETE METABOLIC PANEL WITH GFR
AG Ratio: 1.4 (calc) (ref 1.0–2.5)
ALT: 13 U/L (ref 9–46)
AST: 20 U/L (ref 10–35)
Albumin: 4 g/dL (ref 3.6–5.1)
Alkaline phosphatase (APISO): 116 U/L (ref 35–144)
BUN: 15 mg/dL (ref 7–25)
CO2: 19 mmol/L — ABNORMAL LOW (ref 20–32)
Calcium: 9.3 mg/dL (ref 8.6–10.3)
Chloride: 105 mmol/L (ref 98–110)
Creat: 0.81 mg/dL (ref 0.70–1.18)
GFR, Est African American: 101 mL/min/{1.73_m2} (ref >=60)
GFR, Est Non African American: 87 mL/min/{1.73_m2} (ref >=60)
Globulin: 2.9 g/dL (calc) (ref 1.9–3.7)
Glucose, Bld: 129 mg/dL — ABNORMAL HIGH (ref 65–99)
Potassium: 4.6 mmol/L (ref 3.5–5.3)
Sodium: 136 mmol/L (ref 135–146)
Total Bilirubin: 0.6 mg/dL (ref 0.2–1.2)
Total Protein: 6.9 g/dL (ref 6.1–8.1)

## 2019-11-01 LAB — HEMOGLOBIN A1C
Hgb A1c MFr Bld: 6.7 % of total Hgb — ABNORMAL HIGH (ref <5.7)
Mean Plasma Glucose: 146 (calc)
eAG (mmol/L): 8.1 (calc)

## 2019-11-02 ENCOUNTER — Other Ambulatory Visit: Payer: Self-pay

## 2019-11-02 ENCOUNTER — Ambulatory Visit (INDEPENDENT_AMBULATORY_CARE_PROVIDER_SITE_OTHER): Payer: Medicare HMO | Admitting: Nurse Practitioner

## 2019-11-02 ENCOUNTER — Encounter: Payer: Self-pay | Admitting: Nurse Practitioner

## 2019-11-02 VITALS — BP 130/78 | HR 79 | Temp 96.6°F | Ht 69.0 in | Wt 257.0 lb

## 2019-11-02 DIAGNOSIS — I10 Essential (primary) hypertension: Secondary | ICD-10-CM | POA: Diagnosis not present

## 2019-11-02 DIAGNOSIS — E1141 Type 2 diabetes mellitus with diabetic mononeuropathy: Secondary | ICD-10-CM | POA: Diagnosis not present

## 2019-11-02 DIAGNOSIS — E782 Mixed hyperlipidemia: Secondary | ICD-10-CM

## 2019-11-02 DIAGNOSIS — K5900 Constipation, unspecified: Secondary | ICD-10-CM | POA: Diagnosis not present

## 2019-11-02 DIAGNOSIS — M1711 Unilateral primary osteoarthritis, right knee: Secondary | ICD-10-CM | POA: Diagnosis not present

## 2019-11-02 DIAGNOSIS — Z6838 Body mass index (BMI) 38.0-38.9, adult: Secondary | ICD-10-CM

## 2019-11-02 DIAGNOSIS — I700 Atherosclerosis of aorta: Secondary | ICD-10-CM | POA: Insufficient documentation

## 2019-11-02 DIAGNOSIS — C61 Malignant neoplasm of prostate: Secondary | ICD-10-CM

## 2019-11-02 MED ORDER — ATORVASTATIN CALCIUM 20 MG PO TABS: 20.0000 mg | ORAL_TABLET | Freq: Every morning | ORAL | 1 refills | Status: DC

## 2019-11-02 NOTE — Patient Instructions (Addendum)
To make appt with Dr Katy Fitch for follow up on eyes Ask someone to drive you to appt  Okay to take ASA EC 81 mg daily    DASH Eating Plan DASH stands for "Dietary Approaches to Stop Hypertension." The DASH eating plan is a healthy eating plan that has been shown to reduce high blood pressure (hypertension). It may also reduce your risk for type 2 diabetes, heart disease, and stroke. The DASH eating plan may also help with weight loss. What are tips for following this plan?  General guidelines  Avoid eating more than 2,300 mg (milligrams) of salt (sodium) a day. If you have hypertension, you may need to reduce your sodium intake to 1,500 mg a day.  Limit alcohol intake to no more than 1 drink a day for nonpregnant women and 2 drinks a day for men. One drink equals 12 oz of beer, 5 oz of wine, or 1 oz of hard liquor.  Work with your health care provider to maintain a healthy body weight or to lose weight. Ask what an ideal weight is for you.  Get at least 30 minutes of exercise that causes your heart to beat faster (aerobic exercise) most days of the week. Activities may include walking, swimming, or biking.  Work with your health care provider or diet and nutrition specialist (dietitian) to adjust your eating plan to your individual calorie needs. Reading food labels   Check food labels for the amount of sodium per serving. Choose foods with less than 5 percent of the Daily Value of sodium. Generally, foods with less than 300 mg of sodium per serving fit into this eating plan.  To find whole grains, look for the word "whole" as the first word in the ingredient list. Shopping  Buy products labeled as "low-sodium" or "no salt added."  Buy fresh foods. Avoid canned foods and premade or frozen meals. Cooking  Avoid adding salt when cooking. Use salt-free seasonings or herbs instead of table salt or sea salt. Check with your health care provider or pharmacist before using salt  substitutes.  Do not fry foods. Cook foods using healthy methods such as baking, boiling, grilling, and broiling instead.  Cook with heart-healthy oils, such as olive, canola, soybean, or sunflower oil. Meal planning  Eat a balanced diet that includes: ? 5 or more servings of fruits and vegetables each day. At each meal, try to fill half of your plate with fruits and vegetables. ? Up to 6-8 servings of whole grains each day. ? Less than 6 oz of lean meat, poultry, or fish each day. A 3-oz serving of meat is about the same size as a deck of cards. One egg equals 1 oz. ? 2 servings of low-fat dairy each day. ? A serving of nuts, seeds, or beans 5 times each week. ? Heart-healthy fats. Healthy fats called Omega-3 fatty acids are found in foods such as flaxseeds and coldwater fish, like sardines, salmon, and mackerel.  Limit how much you eat of the following: ? Canned or prepackaged foods. ? Food that is high in trans fat, such as fried foods. ? Food that is high in saturated fat, such as fatty meat. ? Sweets, desserts, sugary drinks, and other foods with added sugar. ? Full-fat dairy products.  Do not salt foods before eating.  Try to eat at least 2 vegetarian meals each week.  Eat more home-cooked food and less restaurant, buffet, and fast food.  When eating at a restaurant, ask that  your food be prepared with less salt or no salt, if possible. What foods are recommended? The items listed may not be a complete list. Talk with your dietitian about what dietary choices are best for you. Grains Whole-grain or whole-wheat bread. Whole-grain or whole-wheat pasta. Brown rice. [place] Morrow. Bulgur. Whole-grain and low-sodium cereals. Pita bread. Low-fat, low-sodium crackers. Whole-wheat flour tortillas. Vegetables Fresh or frozen vegetables (raw, steamed, roasted, or grilled). Low-sodium or reduced-sodium tomato and vegetable juice. Low-sodium or reduced-sodium tomato sauce and tomato  paste. Low-sodium or reduced-sodium canned vegetables. Fruits All fresh, dried, or frozen fruit. Canned fruit in natural juice (without added sugar). Meat and other protein foods Skinless chicken or [place]. Ground chicken or [place]. Pork with fat trimmed off. Fish and seafood. Egg whites. Dried beans, peas, or lentils. Unsalted nuts, nut butters, and seeds. Unsalted canned beans. Lean cuts of beef with fat trimmed off. Low-sodium, lean deli meat. Dairy Low-fat (1%) or fat-free (skim) milk. Fat-free, low-fat, or reduced-fat cheeses. Nonfat, low-sodium ricotta or cottage cheese. Low-fat or nonfat yogurt. Low-fat, low-sodium cheese. Fats and oils Soft margarine without trans fats. Vegetable oil. Low-fat, reduced-fat, or light mayonnaise and salad dressings (reduced-sodium). Canola, safflower, olive, soybean, and sunflower oils. Avocado. Seasoning and other foods Herbs. Spices. Seasoning mixes without salt. Unsalted popcorn and pretzels. Fat-free sweets. What foods are not recommended? The items listed may not be a complete list. Talk with your dietitian about what dietary choices are best for you. Grains Baked goods made with fat, such as croissants, muffins, or some breads. Dry pasta or rice meal packs. Vegetables Creamed or fried vegetables. Vegetables in a cheese sauce. Regular canned vegetables (not low-sodium or reduced-sodium). Regular canned tomato sauce and paste (not low-sodium or reduced-sodium). Regular tomato and vegetable juice (not low-sodium or reduced-sodium). Angie Fava. Olives. Fruits Canned fruit in a light or heavy syrup. Fried fruit. Fruit in cream or butter sauce. Meat and other protein foods Fatty cuts of meat. Ribs. Fried meat. Berniece Salines. Sausage. Bologna and other processed lunch meats. Salami. Fatback. Hotdogs. Bratwurst. Salted nuts and seeds. Canned beans with added salt. Canned or smoked fish. Whole eggs or egg yolks. Chicken or [place] with skin. Dairy Whole or 2% milk,  cream, and half-and-half. Whole or full-fat cream cheese. Whole-fat or sweetened yogurt. Full-fat cheese. Nondairy creamers. Whipped toppings. Processed cheese and cheese spreads. Fats and oils Butter. Stick margarine. Lard. Shortening. Ghee. Bacon fat. Tropical oils, such as coconut, palm kernel, or palm oil. Seasoning and other foods Salted popcorn and pretzels. Onion salt, garlic salt, seasoned salt, table salt, and sea salt. Worcestershire sauce. Tartar sauce. Barbecue sauce. Teriyaki sauce. Soy sauce, including reduced-sodium. Steak sauce. Canned and packaged gravies. Fish sauce. Oyster sauce. Cocktail sauce. Horseradish that you find on the shelf. Ketchup. Mustard. Meat flavorings and tenderizers. Bouillon cubes. Hot sauce and Tabasco sauce. Premade or packaged marinades. Premade or packaged taco seasonings. Relishes. Regular salad dressings. Where to find more information:  National Heart, Lung, and [place]: https://wilson-eaton.com/  [name] Heart Association: www.heart.org Summary  The DASH eating plan is a healthy eating plan that has been shown to reduce high blood pressure (hypertension). It may also reduce your risk for type 2 diabetes, heart disease, and stroke.  With the DASH eating plan, you should limit salt (sodium) intake to 2,300 mg a day. If you have hypertension, you may need to reduce your sodium intake to 1,500 mg a day.  When on the DASH eating plan, aim to eat more fresh fruits and  vegetables, whole grains, lean proteins, low-fat dairy, and heart-healthy fats.  Work with your health care provider or diet and nutrition specialist (dietitian) to adjust your eating plan to your individual calorie needs. This information is not intended to replace advice given to you by your health care provider. Make sure you discuss any questions you have with your health care provider. Document Revised: 01/14/2017 Document Reviewed: 01/26/2016 Elsevier Patient Education  2020 [employer].

## 2019-11-02 NOTE — Progress Notes (Signed)
Careteam: Patient Care Team: [name], NP as PCP - General (Geriatric Medicine) Festus Aloe, MD as Consulting Physician (Urology)  PLACE OF SERVICE:  [place]  Advanced Directive information    Allergies  Allergen Reactions  . Other Other (See Comments)    Patient stated,"if I touch felt (material) I get "knots" on my body."  . Peach [Prunus Persica] Other (See Comments)    Patient stated,"if I touch or eat peach fuzz, I get "knots" all over my face and mouth."     Chief Complaint  Patient presents with  . Medical Management of Chronic Issues    10 month follow-up and discuss labs (copy printed)   . Quality Metric Gaps    Discuss need for eye exam and MALB. Foot exam today   . Immunizations    Discuss need for PNA     HPI: Patient is a 74 y.o. male for routine follow up  Prostate cancer- continues to follow up with urology and oncology. Getting Androgen Deprivation given under the care of Dr. Junious Silk [facility]. xtandi 160 mg daily No changes in urination.  No pain reported  Hyperlipidemia goal LDL <70- current LDL 88.   Obesity- has limited ice cream and amount of snacks  Reports he alternates between ASA 81 and 325 mg   DM- a1c at goal. Diet controlled.   Review of Systems:  Review of Systems  Constitutional: Negative for chills, fever and weight loss.  HENT: Negative for hearing loss.   Respiratory: Negative for cough, sputum production and shortness of breath.   Cardiovascular: Negative for chest pain, palpitations and leg swelling.  Gastrointestinal: Negative for abdominal pain, constipation, diarrhea and heartburn.  Genitourinary: Negative for dysuria, frequency and urgency.  Musculoskeletal: Negative for back pain, falls, joint pain and myalgias.  Skin: Negative.   Neurological: Negative for dizziness and headaches.  Psychiatric/Behavioral: Negative for depression and memory loss. The patient does not have insomnia.      Past Medical History:  Diagnosis Date  . Arthritis   . Blind left eye   . Cyst in hand 10/2013   left hand, treated with antibiotic & pain medicine  . Diabetes mellitus without complication ([place])    ?? borderline  being checked- no medications  . Heart murmur   . Hepatitis A    25 YRS AGO  . Hyperlipidemia   . Prostate cancer ([place])   . Sleep apnea    ????   O2 DESAT  10/26/13  IN ED , no sleep study ever done per pt- no cpap   Past Surgical History:  Procedure Laterality Date  . COLONOSCOPY    . EYE SURGERY     LEFT AYE AS CHILD   . JOINT REPLACEMENT Bilateral    LT KNEE 3 YRS AGO   . 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 TOTAL KNEE ARTHROPLASTY Right 12/04/2013   Procedure: TOTAL KNEE ARTHROPLASTY;  Surgeon: Hessie Dibble, MD;  Location: [place];  Service: Orthopedics;  Laterality: Right;   Social History:   reports that he quit smoking about 36 years ago. His smoking use included cigarettes. He has a 2.50 pack-year smoking history. He has never used smokeless tobacco. He reports that he does not drink alcohol and does not use drugs.  Family History  Problem Relation Age of Onset  . Stroke Mother   . Heart attack Mother   . Diabetes Mother   . Stroke Father   .  Stomach cancer Brother   . Prostate cancer Brother   . Throat cancer Sister   . Colon cancer Neg Hx   . Esophageal cancer Neg Hx   . Rectal cancer Neg Hx     Medications: Patient's Medications  New Prescriptions   No medications on file  Previous Medications   ASPIRIN EC 325 MG TABLET    Take 325 mg by mouth daily.    ATORVASTATIN (LIPITOR) 10 MG TABLET    Take 1 tablet (10 mg total) by mouth every morning.   PSYLLIUM (METAMUCIL PO)    Take by mouth daily. As needed   TRIPTORELIN (TRELSTAR) 11.25 MG INJECTION    Inject 11.25 mg into the muscle. Every 6 months   XTANDI 40 MG CAPSULE    TAKE 4 CAPSULES BY MOUTH ONCE DAILY.  Modified Medications   No medications on file   Discontinued Medications   No medications on file    Physical Exam:  Vitals:   11/02/19 1408  BP: (!) 144/82  Pulse: 79  Temp: (!) 96.6 F (35.9 C)  TempSrc: Temporal  SpO2: 98%  Weight: 257 lb (116.6 kg)  Height: 5\' 9"  (1.753 m)   Body mass index is 37.95 kg/m. Wt Readings from Last 3 Encounters:  11/02/19 257 lb (116.6 kg)  08/02/19 265 lb 8 oz (120.4 kg)  04/03/19 262 lb 12.8 oz (119.2 kg)    Physical Exam Constitutional:      General: He is not in acute distress.    Appearance: He is well-developed. He is not diaphoretic.  HENT:     Head: Normocephalic and atraumatic.     Mouth/Throat:     Pharynx: No oropharyngeal exudate.  Eyes:     Comments: Left eye clouding- blind  Cardiovascular:     Rate and Rhythm: Normal rate and regular rhythm.     Pulses:          Dorsalis pedis pulses are 2+ on the right side and 2+ on the left side.       Posterior tibial pulses are 2+ on the right side.     Heart sounds: Normal heart sounds.  Pulmonary:     Effort: Pulmonary effort is normal.     Breath sounds: Normal breath sounds.  Abdominal:     General: Bowel sounds are normal.     Palpations: Abdomen is soft.  Musculoskeletal:        General: No tenderness.     Cervical back: Normal range of motion and neck supple.  Feet:     Right foot:     Protective Sensation: 3 sites tested. 3 sites sensed.     Skin integrity: Skin integrity normal.     Left foot:     Protective Sensation: 3 sites tested. 3 sites sensed.     Skin integrity: Skin integrity normal.  Skin:    General: Skin is warm and dry.  Neurological:     Mental Status: He is alert and oriented to person, place, and time.    Labs reviewed: Basic Metabolic Panel: Recent Labs    03/27/19 1145 07/31/19 1050 10/31/19 0820  NA 137 139 136  K 4.0 4.5 4.6  CL 107 108 105  CO2 20* 20* 19*  GLUCOSE 162* 168* 129*  BUN 11 13 15   CREATININE 0.85 1.10 0.81  CALCIUM 9.5 9.6 9.3   Liver Function  Tests: Recent Labs    11/21/18 1055 11/21/18 1055 03/27/19 1145 07/31/19 1050 10/31/19 0820  AST  17   < > 17 16 20   ALT 13   < > 16 14 13   ALKPHOS 122  --  119 113  --   BILITOT 0.4   < > 0.4 0.5 0.6  PROT 7.4   < > 7.4 7.4 6.9  ALBUMIN 3.9  --  3.8 3.8  --    < > = values in this interval not displayed.   No results for input(s): LIPASE, AMYLASE in the last 8760 hours. No results for input(s): AMMONIA in the last 8760 hours. CBC: Recent Labs    11/21/18 1055 03/27/19 1145 07/31/19 1050  WBC 5.9 5.4 6.1  NEUTROABS 2.7 2.3 2.7  HGB 14.2 14.1 13.9  HCT 42.6 42.4 41.9  MCV 94.5 93.2 93.3  PLT 226 224 229   Lipid Panel: Recent Labs    10/31/19 0820  CHOL 183  HDL 78  LDLCALC 88  TRIG 84  CHOLHDL 2.3   TSH: No results for input(s): TSH in the last 8760 hours. A1C: Lab Results  Component Value Date   HGBA1C 6.7 (H) 10/31/2019     Assessment/Plan 1. Type 2 diabetes mellitus with diabetic mononeuropathy, without long-term current use of insulin ([place]) -diet controlled. Encouraged dietary compliance, routine foot care/monitoring and to keep up with diabetic eye exams through ophthalmology  - Microalbumin, urine  2. Mixed hyperlipidemia LDL goal <70, currently at 88. Will increase lipitor to 20 mg to get to goal. Encouraged dietary modifications.  - atorvastatin (LIPITOR) 20 MG tablet; Take 1 tablet (20 mg total) by mouth every morning.  Dispense: 90 tablet; Refill: 1  3. Prostate cancer ([place]) -no progression of symptoms continues to follow up with oncology and urology, continues on xtandi and androgen deprivation per urologist  4. Atherosclerosis of aorta ([facility]) Continues on ASA- recommend 81 mg EC ASA, continues on statin.  5. Essential hypertension Improved bp on recheck, continue on dietary modifications  7. Primary osteoarthritis of right knee -stable at this time  8. Constipation, unspecified constipation type Controlled on current regimen.   9.  Class 2 severe obesity due to excess calories with serious comorbidity and body mass index (BMI) of 38.0 to 38.9 in adult [facility]) With diabetes, weight loss encouraged through diet modifications and increase in physical activity as tolerates.   Next appt: 6 months, labs prior  Colette Dicamillo K. [place], [facility] [phone]

## 2019-11-03 LAB — MICROALBUMIN, URINE: Microalb, Ur: 0.6 mg/dL

## 2019-11-06 MED FILL — XTANDI 40 MG CAPSULE: 40 | 30 days supply | Qty: 120 | Fill #0

## 2019-12-03 MED FILL — XTANDI 40 MG CAPSULE: 40 | 30 days supply | Qty: 120 | Fill #1

## 2019-12-04 ENCOUNTER — Inpatient Hospital Stay: Payer: Medicare HMO | Attending: Oncology

## 2019-12-04 ENCOUNTER — Other Ambulatory Visit: Payer: Self-pay

## 2019-12-04 DIAGNOSIS — Z7982 Long term (current) use of aspirin: Secondary | ICD-10-CM | POA: Diagnosis not present

## 2019-12-04 DIAGNOSIS — I10 Essential (primary) hypertension: Secondary | ICD-10-CM | POA: Insufficient documentation

## 2019-12-04 DIAGNOSIS — Z192 Hormone resistant malignancy status: Secondary | ICD-10-CM | POA: Insufficient documentation

## 2019-12-04 DIAGNOSIS — C61 Malignant neoplasm of prostate: Secondary | ICD-10-CM | POA: Diagnosis not present

## 2019-12-04 DIAGNOSIS — Z79899 Other long term (current) drug therapy: Secondary | ICD-10-CM | POA: Insufficient documentation

## 2019-12-04 DIAGNOSIS — Z923 Personal history of irradiation: Secondary | ICD-10-CM | POA: Diagnosis not present

## 2019-12-04 LAB — CBC WITH DIFFERENTIAL (CANCER CENTER ONLY)
Abs Immature Granulocytes: 0.04 10*3/uL (ref 0.00–0.07)
Basophils Absolute: 0 10*3/uL (ref 0.0–0.1)
Basophils Relative: 1 %
Eosinophils Absolute: 0.1 10*3/uL (ref 0.0–0.5)
Eosinophils Relative: 2 %
HCT: 42.4 % (ref 39.0–52.0)
Hemoglobin: 14.3 g/dL (ref 13.0–17.0)
Immature Granulocytes: 1 %
Lymphocytes Relative: 43 %
Lymphs Abs: 2.8 10*3/uL (ref 0.7–4.0)
MCH: 31 pg (ref 26.0–34.0)
MCHC: 33.7 g/dL (ref 30.0–36.0)
MCV: 91.8 fL (ref 80.0–100.0)
Monocytes Absolute: 0.6 10*3/uL (ref 0.1–1.0)
Monocytes Relative: 10 %
Neutro Abs: 2.7 10*3/uL (ref 1.7–7.7)
Neutrophils Relative %: 43 %
Platelet Count: 239 10*3/uL (ref 150–400)
RBC: 4.62 MIL/uL (ref 4.22–5.81)
RDW: 13.1 % (ref 11.5–15.5)
WBC Count: 6.3 10*3/uL (ref 4.0–10.5)
nRBC: 0 % (ref 0.0–0.2)

## 2019-12-04 LAB — CMP (CANCER CENTER ONLY)
ALT: 12 U/L (ref 0–44)
AST: 15 U/L (ref 15–41)
Albumin: 3.7 g/dL (ref 3.5–5.0)
Alkaline Phosphatase: 130 U/L — ABNORMAL HIGH (ref 38–126)
Anion gap: 8 (ref 5–15)
BUN: 13 mg/dL (ref 8–23)
CO2: 22 mmol/L (ref 22–32)
Calcium: 9.7 mg/dL (ref 8.9–10.3)
Chloride: 106 mmol/L (ref 98–111)
Creatinine: 0.91 mg/dL (ref 0.61–1.24)
GFR, Estimated: >60 mL/min (ref >60)
Glucose, Bld: 142 mg/dL — ABNORMAL HIGH (ref 70–99)
Potassium: 4.1 mmol/L (ref 3.5–5.1)
Sodium: 136 mmol/L (ref 135–145)
Total Bilirubin: 0.4 mg/dL (ref 0.3–1.2)
Total Protein: 7.5 g/dL (ref 6.5–8.1)

## 2019-12-05 LAB — PROSTATE-SPECIFIC AG, SERUM (LABCORP): Prostate Specific Ag, Serum: 3.4 ng/mL (ref 0.0–4.0)

## 2019-12-11 ENCOUNTER — Other Ambulatory Visit: Payer: Self-pay

## 2019-12-11 ENCOUNTER — Inpatient Hospital Stay (HOSPITAL_BASED_OUTPATIENT_CLINIC_OR_DEPARTMENT_OTHER): Payer: Medicare HMO | Admitting: Oncology

## 2019-12-11 VITALS — BP 155/79 | HR 95 | Temp 97.2°F | Resp 18 | Ht 69.0 in | Wt 256.4 lb

## 2019-12-11 DIAGNOSIS — C61 Malignant neoplasm of prostate: Secondary | ICD-10-CM | POA: Diagnosis not present

## 2019-12-11 DIAGNOSIS — Z79899 Other long term (current) drug therapy: Secondary | ICD-10-CM | POA: Diagnosis not present

## 2019-12-11 DIAGNOSIS — Z7982 Long term (current) use of aspirin: Secondary | ICD-10-CM | POA: Diagnosis not present

## 2019-12-11 DIAGNOSIS — Z192 Hormone resistant malignancy status: Secondary | ICD-10-CM | POA: Diagnosis not present

## 2019-12-11 DIAGNOSIS — Z923 Personal history of irradiation: Secondary | ICD-10-CM | POA: Diagnosis not present

## 2019-12-11 DIAGNOSIS — I10 Essential (primary) hypertension: Secondary | ICD-10-CM | POA: Diagnosis not present

## 2019-12-11 NOTE — Progress Notes (Signed)
Hematology and Oncology Follow Up Visit  Samuel [name] [id number] 06/25/1945 74 y.o. 12/11/2019 9:58 AM Samuel [name], Samuel [name], NPEubanks, Samuel [name], NP   Principle Diagnosis: 74 year old man with advanced prostate cancer with biochemical relapse diagnosed in 2017. 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  Prior Therapy: He was treated with external beam radiation and androgen deprivation.  He developed biochemical relapse with a PSA rise and he has been on combined androgen deprivation with [place] agonist on bicalutamide. PSA in June 2016 was 6.11, in December 2016 was 10, in June 2017 was 9 and in September 2017 was up to 12.  Staging workup including CT scan of the abdomen and pelvis as well as bone scan showed no evidence of metastatic disease.  His PSA was up to 22.5 in April 2018.   Current therapy:   Androgen Deprivation given under the care of Dr. Junious [name].   Xtandi 160 mg daily started in May 2018.  Interim History: Samuel [name] returns today for a follow-up visit. Since her last visit, he reports no major changes in his health.  He denies any complications related to [facility].  He denies any nausea, vomiting or abdominal pain.  He denies any recent hospitalization or illnesses.  He denies any bone pain or pathological fractures.  His quality of life is unchanged.               Medications: Updated on review. Current Outpatient Medications  Medication Sig Dispense Refill  . aspirin EC 325 MG tablet Take 325 mg by mouth daily.     Marland Kitchen atorvastatin (LIPITOR) 20 MG tablet Take 1 tablet (20 mg total) by mouth every morning. 90 tablet 1  . Psyllium (METAMUCIL PO) Take by mouth daily. As needed    . triptorelin (TRELSTAR) 11.25 MG injection Inject 11.25 mg into the muscle. Every 6 months    . XTANDI 40 MG capsule TAKE 4 CAPSULES BY MOUTH ONCE DAILY. 120 capsule 1   No current facility-administered medications for this visit.     Allergies:   Allergies  Allergen Reactions  . Other Other (See Comments)    Patient stated,"if I touch felt (material) I get "knots" on my body."  . Peach [Prunus Persica] Other (See Comments)    Patient stated,"if I touch or eat peach fuzz, I get "knots" all over my face and mouth."        Physical Exam:    Blood pressure (!) 155/79, pulse 95, temperature (!) 97.2 F (36.2 C), temperature source Tympanic, resp. rate 18, height 5\' 9"  (1.753 m), weight 256 lb 6.4 oz (116.3 kg), SpO2 98 %.     ECOG: 1    General appearance: Comfortable appearing without any discomfort Head: Normocephalic without any trauma Oropharynx: Mucous membranes are moist and pink without any thrush or ulcers. Eyes: Pupils are equal and round reactive to light. Lymph nodes: No cervical, supraclavicular, inguinal or axillary lymphadenopathy.   Heart:regular rate and rhythm.  S1 and S2 without leg edema. Lung: Clear without any rhonchi or wheezes.  No dullness to percussion. Abdomin: Soft, nontender, nondistended with good bowel sounds.  No hepatosplenomegaly. Musculoskeletal: No joint deformity or effusion.  Full range of motion noted. Neurological: No deficits noted on motor, sensory and deep tendon reflex exam. Skin: No petechial rash or dryness.  Appeared moist.            Lab Results: Lab Results  Component Value Date   WBC 6.3 12/04/2019   HGB  14.3 12/04/2019   HCT 42.4 12/04/2019   MCV 91.8 12/04/2019   PLT 239 12/04/2019     Chemistry      Component Value Date/Time   NA 136 12/04/2019 1101   NA 137 12/15/2016 1421   K 4.1 12/04/2019 1101   K 4.4 12/15/2016 1421   CL 106 12/04/2019 1101   CO2 22 12/04/2019 1101   CO2 19 (L) 12/15/2016 1421   BUN 13 12/04/2019 1101   BUN 13.4 12/15/2016 1421   CREATININE 0.91 12/04/2019 1101   CREATININE 0.81 10/31/2019 0820   CREATININE 0.8 12/15/2016 1421      Component Value Date/Time   CALCIUM 9.7 12/04/2019 1101   CALCIUM 9.4 12/15/2016 1421    ALKPHOS 130 (H) 12/04/2019 1101   ALKPHOS 92 12/15/2016 1421   AST 15 12/04/2019 1101   AST 17 12/15/2016 1421   ALT 12 12/04/2019 1101   ALT 12 12/15/2016 1421   BILITOT 0.4 12/04/2019 1101   BILITOT 0.37 12/15/2016 1421       Results for Samuel [name], Samuel [name] (MRN [id number]) as of 12/11/2019 09:59  Ref. Range 03/27/2019 11:45 07/31/2019 10:50 12/04/2019 11:01  Prostate Specific Ag, Serum Latest Ref Range: 0.0 - 4.0 ng/mL 2.8 3.2 3.4       Impression and Plan:   74 year old man with  1. 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 He has a biochemical relapse only.    He has tolerated Xtandi with reasonable PSA response since 2018. His PSA currently at 3.4 which not dramatically changed in the last 12 months. The natural course of his disease and alternative treatment options were reviewed. At this time these will be deferred unless he has measurable disease and rapid rise in his PSA. Risks and benefits of continuing Xtandi for the time being were discussed. Potential complications including hypertension, fatigue and rarely seizures. Alternative options such as systemic chemotherapy and Xofigo were reviewed as well.  He is agreeable with this plan.   2. Androgen depravation: He continues to receive that under the care of Dr. Junious [name] which I recommended continuing indefinitely.  3.  Hypertension: Mildly elevated but overall under control.  4. Follow-up: In 4 months for follow-up visit.  30  minutes were dedicated to this visit. The time was spent on reviewing laboratory data, treatment options and addressing complications related to his current and future therapies.  Samuel Button, MD 10/26/20219:58 AM

## 2019-12-25 ENCOUNTER — Other Ambulatory Visit: Payer: Self-pay | Admitting: Oncology

## 2019-12-25 DIAGNOSIS — C61 Malignant neoplasm of prostate: Secondary | ICD-10-CM

## 2019-12-31 MED FILL — XTANDI 40 MG CAPSULE: 40 | 30 days supply | Qty: 120 | Fill #0

## 2020-01-29 MED FILL — XTANDI 40 MG CAPSULE: 40 | 30 days supply | Qty: 120 | Fill #1

## 2020-02-20 ENCOUNTER — Other Ambulatory Visit: Payer: Self-pay | Admitting: Oncology

## 2020-02-20 DIAGNOSIS — C61 Malignant neoplasm of prostate: Secondary | ICD-10-CM

## 2020-02-25 ENCOUNTER — Telehealth: Payer: Self-pay

## 2020-02-25 NOTE — Telephone Encounter (Signed)
Oral Oncology Patient Advocate Encounter   Was successful in securing patient an [phone] grant from Patient [facility]) to provide copayment coverage for Xtandi.  This will keep the out of pocket expense at $0.     I have spoken with the patient.    The billing information is as follows and has been shared with [place].   Member ID: [id number] Group ID: [id number] RxBin: [id number] Dates of Eligibility: 11/22/19 through 02/18/21  Fund:  [place] Patient [place] Phone [phone] Fax [phone] 02/25/2020 11:28 AM

## 2020-02-27 MED FILL — XTANDI 40 MG CAPSULE: 40 | 30 days supply | Qty: 120 | Fill #0

## 2020-03-10 DIAGNOSIS — R9721 Rising PSA following treatment for malignant neoplasm of prostate: Secondary | ICD-10-CM | POA: Diagnosis not present

## 2020-03-19 DIAGNOSIS — C61 Malignant neoplasm of prostate: Secondary | ICD-10-CM | POA: Diagnosis not present

## 2020-03-25 ENCOUNTER — Telehealth: Payer: Self-pay | Admitting: Oncology

## 2020-03-25 NOTE — Telephone Encounter (Signed)
Called patient regarding 02/15 and 02/22 appointments. Patient is notified.

## 2020-03-31 MED FILL — XTANDI 40 MG CAPSULE: 40 | 30 days supply | Qty: 120 | Fill #1

## 2020-04-01 ENCOUNTER — Other Ambulatory Visit: Payer: Self-pay

## 2020-04-01 ENCOUNTER — Inpatient Hospital Stay: Payer: Medicare HMO | Attending: Oncology

## 2020-04-01 DIAGNOSIS — I10 Essential (primary) hypertension: Secondary | ICD-10-CM | POA: Diagnosis not present

## 2020-04-01 DIAGNOSIS — E291 Testicular hypofunction: Secondary | ICD-10-CM | POA: Diagnosis not present

## 2020-04-01 DIAGNOSIS — C61 Malignant neoplasm of prostate: Secondary | ICD-10-CM

## 2020-04-01 LAB — CBC WITH DIFFERENTIAL (CANCER CENTER ONLY)
Abs Immature Granulocytes: 0.02 10*3/uL (ref 0.00–0.07)
Basophils Absolute: 0 10*3/uL (ref 0.0–0.1)
Basophils Relative: 1 %
Eosinophils Absolute: 0 10*3/uL (ref 0.0–0.5)
Eosinophils Relative: 0 %
HCT: 42.5 % (ref 39.0–52.0)
Hemoglobin: 14.5 g/dL (ref 13.0–17.0)
Immature Granulocytes: 0 %
Lymphocytes Relative: 44 %
Lymphs Abs: 2.3 10*3/uL (ref 0.7–4.0)
MCH: 30.9 pg (ref 26.0–34.0)
MCHC: 34.1 g/dL (ref 30.0–36.0)
MCV: 90.6 fL (ref 80.0–100.0)
Monocytes Absolute: 0.6 10*3/uL (ref 0.1–1.0)
Monocytes Relative: 11 %
Neutro Abs: 2.3 10*3/uL (ref 1.7–7.7)
Neutrophils Relative %: 44 %
Platelet Count: 229 10*3/uL (ref 150–400)
RBC: 4.69 MIL/uL (ref 4.22–5.81)
RDW: 13.1 % (ref 11.5–15.5)
WBC Count: 5.2 10*3/uL (ref 4.0–10.5)
nRBC: 0 % (ref 0.0–0.2)

## 2020-04-01 LAB — CMP (CANCER CENTER ONLY)
ALT: 14 U/L (ref 0–44)
AST: 15 U/L (ref 15–41)
Albumin: 3.9 g/dL (ref 3.5–5.0)
Alkaline Phosphatase: 123 U/L (ref 38–126)
Anion gap: 8 (ref 5–15)
BUN: 13 mg/dL (ref 8–23)
CO2: 22 mmol/L (ref 22–32)
Calcium: 9.3 mg/dL (ref 8.9–10.3)
Chloride: 107 mmol/L (ref 98–111)
Creatinine: 0.99 mg/dL (ref 0.61–1.24)
GFR, Estimated: >60 mL/min (ref >60)
Glucose, Bld: 121 mg/dL — ABNORMAL HIGH (ref 70–99)
Potassium: 4.2 mmol/L (ref 3.5–5.1)
Sodium: 137 mmol/L (ref 135–145)
Total Bilirubin: 0.5 mg/dL (ref 0.3–1.2)
Total Protein: 7.6 g/dL (ref 6.5–8.1)

## 2020-04-02 LAB — PROSTATE-SPECIFIC AG, SERUM (LABCORP): Prostate Specific Ag, Serum: 3.6 ng/mL (ref 0.0–4.0)

## 2020-04-08 ENCOUNTER — Other Ambulatory Visit: Payer: Self-pay

## 2020-04-08 ENCOUNTER — Inpatient Hospital Stay (HOSPITAL_BASED_OUTPATIENT_CLINIC_OR_DEPARTMENT_OTHER): Payer: Medicare HMO | Admitting: Oncology

## 2020-04-08 VITALS — BP 152/76 | HR 90 | Temp 97.6°F | Resp 19 | Ht 69.0 in | Wt 259.9 lb

## 2020-04-08 DIAGNOSIS — I10 Essential (primary) hypertension: Secondary | ICD-10-CM | POA: Diagnosis not present

## 2020-04-08 DIAGNOSIS — C61 Malignant neoplasm of prostate: Secondary | ICD-10-CM

## 2020-04-08 DIAGNOSIS — E291 Testicular hypofunction: Secondary | ICD-10-CM | POA: Diagnosis not present

## 2020-04-08 NOTE — Progress Notes (Signed)
Hematology and Oncology Follow Up Visit  Samuel [name] [id number] 12-Jan-1946 75 y.o. 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 He presented in 2010 with localized disease.  Prior Therapy: He was treated with external beam radiation and androgen deprivation.  He developed biochemical relapse with a PSA rise and he has been on combined androgen deprivation with [place] agonist on bicalutamide. PSA in June 2016 was 6.11, in December 2016 was 10, in June 2017 was 9 and in September 2017 was up to 12.  Staging workup including CT scan of the abdomen and pelvis as well as bone scan showed no evidence of metastatic disease.  His PSA was up to 22.5 in April 2018.   Current therapy:   Androgen Deprivation given under the care of Dr. Junious Silk.   Xtandi 160 mg daily started in May 2018.  Interim History: [name] presents today for a follow-up visit.  Since the last visit, he reports no major changes in his health.  He has tolerated Xtandi without any complaints.  He denies any nausea vomiting abdominal pain.  He denies excessive fatigue tiredness.  He denies any hospitalization or illnesses.               Medications: Unchanged on review. Current Outpatient Medications  Medication Sig Dispense Refill  . aspirin EC 325 MG tablet Take 325 mg by mouth daily.     Marland Kitchen atorvastatin (LIPITOR) 20 MG tablet Take 1 tablet (20 mg total) by mouth every morning. 90 tablet 1  . Psyllium (METAMUCIL PO) Take by mouth daily. As needed    . triptorelin (TRELSTAR) 11.25 MG injection Inject 11.25 mg into the muscle. Every 6 months    . XTANDI 40 MG capsule TAKE 4 CAPSULES BY MOUTH ONCE DAILY. 120 capsule 1   No current facility-administered medications for this visit.     Allergies:  Allergies  Allergen Reactions  . Other Other (See Comments)     Patient stated,"if I touch felt (material) I get "knots" on my body."  . Peach [Prunus Persica] Other (See Comments)    Patient stated,"if I touch or eat peach fuzz, I get "knots" all over my face and mouth."        Physical Exam:   Blood pressure (!) 152/76, pulse 90, temperature 97.6 F (36.4 C), temperature source Tympanic, resp. rate 19, height 5\' 9"  (1.753 m), weight 259 lb 14.4 oz (117.9 kg), SpO2 100 %.       ECOG: 1   General appearance: Alert, awake without any distress. Head: Atraumatic without abnormalities Oropharynx: Without any thrush or ulcers. Eyes: No scleral icterus. Lymph nodes: No lymphadenopathy noted in the cervical, supraclavicular, or axillary nodes Heart:regular rate and rhythm, without any murmurs or gallops.   Lung: Clear to auscultation without any rhonchi, wheezes or dullness to percussion. Abdomin: Soft, nontender without any shifting dullness or ascites. Musculoskeletal: No clubbing or cyanosis. Neurological: No motor or sensory deficits. Skin: No rashes or lesions.           Lab Results: Lab Results  Component Value Date   WBC 5.2 04/01/2020   HGB 14.5 04/01/2020   HCT 42.5 04/01/2020   MCV 90.6 04/01/2020   PLT 229 04/01/2020     Chemistry      Component Value Date/Time   NA 137 04/01/2020 1132   NA 137 12/15/2016 1421   K 4.2  04/01/2020 1132   K 4.4 12/15/2016 1421   CL 107 04/01/2020 1132   CO2 22 04/01/2020 1132   CO2 19 (L) 12/15/2016 1421   BUN 13 04/01/2020 1132   BUN 13.4 12/15/2016 1421   CREATININE 0.99 04/01/2020 1132   CREATININE 0.81 10/31/2019 0820   CREATININE 0.8 12/15/2016 1421      Component Value Date/Time   CALCIUM 9.3 04/01/2020 1132   CALCIUM 9.4 12/15/2016 1421   ALKPHOS 123 04/01/2020 1132   ALKPHOS 92 12/15/2016 1421   AST 15 04/01/2020 1132   AST 17 12/15/2016 1421   ALT 14 04/01/2020 1132   ALT 12 12/15/2016 1421   BILITOT 0.5 04/01/2020 1132   BILITOT 0.37 12/15/2016 1421       Results for KANIEL, [name] (MRN [id number]) as of 04/08/2020 14:53  Ref. Range 07/31/2019 10:50 12/04/2019 11:01 04/01/2020 11:32  Prostate Specific Ag, Serum Latest Ref Range: 0.0 - 4.0 ng/mL 3.2 3.4 3.6         Impression and Plan:   75 year old man with  1.  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    He is currently on Xtandi which she has tolerated very well without any major complaints.  PSA from April 01, 2020 continues to show very little changes at this time.  Risks and benefits of continuing this treatment versus different chemotherapy options discussed.  These options including systemic chemotherapy, Trudi Ida among others.  I will update genomic testing before the next visit to assess the possibility of PARP inhibitor as well.   2. Androgen depravation: He is currently on therapy indefinitely under the care of Dr. Junious Silk.  I recommended continuing monitoring.  3.  Hypertension: Overall under control but mildly elevated today.  We will continue to monitor on Xtandi.  4. Follow-up: He will return in 4 months for repeat follow-up.  30  minutes were spent on this encounter.  The time was dedicated to reviewing his disease status, discussing treatment options and future plan of care reviewed.  Zola Button, MD 2/22/20223:20 PM

## 2020-04-28 ENCOUNTER — Other Ambulatory Visit: Payer: Medicare HMO

## 2020-04-28 ENCOUNTER — Other Ambulatory Visit: Payer: Self-pay

## 2020-04-28 ENCOUNTER — Other Ambulatory Visit (HOSPITAL_COMMUNITY): Payer: Self-pay | Admitting: Oncology

## 2020-04-28 DIAGNOSIS — E1141 Type 2 diabetes mellitus with diabetic mononeuropathy: Secondary | ICD-10-CM | POA: Diagnosis not present

## 2020-04-28 DIAGNOSIS — E782 Mixed hyperlipidemia: Secondary | ICD-10-CM

## 2020-04-29 LAB — CBC WITH DIFFERENTIAL/PLATELET
Absolute Monocytes: 520 cells/uL (ref 200–950)
Basophils Absolute: 42 cells/uL (ref 0–200)
Basophils Relative: 0.8 %
Eosinophils Absolute: 31 cells/uL (ref 15–500)
Eosinophils Relative: 0.6 %
HCT: 40.5 % (ref 38.5–50.0)
Hemoglobin: 13.9 g/dL (ref 13.2–17.1)
Lymphs Abs: 1867 cells/uL (ref 850–3900)
MCH: 31.1 pg (ref 27.0–33.0)
MCHC: 34.3 g/dL (ref 32.0–36.0)
MCV: 90.6 fL (ref 80.0–100.0)
MPV: 11 fL (ref 7.5–12.5)
Monocytes Relative: 10 %
Neutro Abs: 2740 cells/uL (ref 1500–7800)
Neutrophils Relative %: 52.7 %
Platelets: 212 10*3/uL (ref 140–400)
RBC: 4.47 10*6/uL (ref 4.20–5.80)
RDW: 12.6 % (ref 11.0–15.0)
Total Lymphocyte: 35.9 %
WBC: 5.2 10*3/uL (ref 3.8–10.8)

## 2020-04-29 LAB — HEMOGLOBIN A1C
Hgb A1c MFr Bld: 7.1 % of total Hgb — ABNORMAL HIGH (ref <5.7)
Mean Plasma Glucose: 157 mg/dL
eAG (mmol/L): 8.7 mmol/L

## 2020-04-29 LAB — COMPLETE METABOLIC PANEL WITH GFR
AG Ratio: 1.5 (calc) (ref 1.0–2.5)
ALT: 16 U/L (ref 9–46)
AST: 18 U/L (ref 10–35)
Albumin: 4 g/dL (ref 3.6–5.1)
Alkaline phosphatase (APISO): 112 U/L (ref 35–144)
BUN: 19 mg/dL (ref 7–25)
CO2: 20 mmol/L (ref 20–32)
Calcium: 9.4 mg/dL (ref 8.6–10.3)
Chloride: 107 mmol/L (ref 98–110)
Creat: 0.75 mg/dL (ref 0.70–1.18)
GFR, Est African American: 103 mL/min/{1.73_m2} (ref >=60)
GFR, Est Non African American: 89 mL/min/{1.73_m2} (ref >=60)
Globulin: 2.7 g/dL (calc) (ref 1.9–3.7)
Glucose, Bld: 147 mg/dL — ABNORMAL HIGH (ref 65–99)
Potassium: 4.3 mmol/L (ref 3.5–5.3)
Sodium: 139 mmol/L (ref 135–146)
Total Bilirubin: 0.5 mg/dL (ref 0.2–1.2)
Total Protein: 6.7 g/dL (ref 6.1–8.1)

## 2020-04-29 LAB — LIPID PANEL
Cholesterol: 171 mg/dL (ref <200)
HDL: 74 mg/dL (ref >=40)
LDL Cholesterol (Calc): 82 mg/dL (calc)
Non-HDL Cholesterol (Calc): 97 mg/dL (calc) (ref <130)
Total CHOL/HDL Ratio: 2.3 (calc) (ref <5.0)
Triglycerides: 73 mg/dL (ref <150)

## 2020-04-30 ENCOUNTER — Other Ambulatory Visit: Payer: Self-pay | Admitting: Oncology

## 2020-04-30 DIAGNOSIS — C61 Malignant neoplasm of prostate: Secondary | ICD-10-CM

## 2020-05-02 ENCOUNTER — Ambulatory Visit (INDEPENDENT_AMBULATORY_CARE_PROVIDER_SITE_OTHER): Payer: Medicare HMO | Admitting: Nurse Practitioner

## 2020-05-02 ENCOUNTER — Other Ambulatory Visit: Payer: Self-pay

## 2020-05-02 ENCOUNTER — Encounter: Payer: Self-pay | Admitting: Nurse Practitioner

## 2020-05-02 DIAGNOSIS — I10 Essential (primary) hypertension: Secondary | ICD-10-CM

## 2020-05-02 DIAGNOSIS — E1141 Type 2 diabetes mellitus with diabetic mononeuropathy: Secondary | ICD-10-CM

## 2020-05-02 DIAGNOSIS — E782 Mixed hyperlipidemia: Secondary | ICD-10-CM

## 2020-05-02 DIAGNOSIS — C61 Malignant neoplasm of prostate: Secondary | ICD-10-CM

## 2020-05-02 DIAGNOSIS — I700 Atherosclerosis of aorta: Secondary | ICD-10-CM | POA: Diagnosis not present

## 2020-05-02 NOTE — Progress Notes (Signed)
Careteam: Patient Care Team: [name], NP as PCP - General (Geriatric Medicine) Festus Aloe, MD as Consulting Physician (Urology)  PLACE OF SERVICE:  [place] Directive information Does Patient Have a Medical Advance Directive?: No, Would patient like information on creating a medical advance directive?: No - Patient declined  Allergies  Allergen Reactions  . Other Other (See Comments)    Patient stated,"if I touch felt (material) I get "knots" on my body."  . Peach [Prunus Persica] Other (See Comments)    Patient stated,"if I touch or eat peach fuzz, I get "knots" all over my face and mouth."     Chief Complaint  Patient presents with  . Medical Management of Chronic Issues    6 month follow up. Discuss need for Tetanus/Tdap and eye exam     HPI: Patient is a 75 y.o. male for routine follow up.   prostate cancer-followed by urology and oncology, continues on treslestar injection every 6 months with xtandi daily   Wishes not to receive vaccine, personal choice.   Diabetes- too much ice cream over the last few months. Eating every day. Not on medication for diabetes. Has not had recent diabetic eye exam  Hyperlipidemia- continues on Lipitor 20 mg daily   htn- not on medication, controlled at visit.  Review of Systems:  Review of Systems  Constitutional: Negative for chills, fever and weight loss.  HENT: Negative for tinnitus.   Respiratory: Negative for cough, sputum production and shortness of breath.   Cardiovascular: Negative for chest pain, palpitations and leg swelling.  Gastrointestinal: Negative for abdominal pain, constipation, diarrhea and heartburn.  Genitourinary: Negative for dysuria, frequency and urgency.  Musculoskeletal: Negative for back pain, falls, joint pain and myalgias.  Skin: Negative.   Neurological: Negative for dizziness and headaches.  Psychiatric/Behavioral: Negative for depression and memory loss. The patient  does not have insomnia.     Past Medical History:  Diagnosis Date  . Arthritis   . Blind left eye   . Cyst in hand 10/2013   left hand, treated with antibiotic & pain medicine  . Diabetes mellitus without complication ([place])    ?? borderline  being checked- no medications  . Heart murmur   . Hepatitis A    25 YRS AGO  . Hyperlipidemia   . Prostate cancer ([place])   . Sleep apnea    ????   O2 DESAT  10/26/13  IN ED , no sleep study ever done per pt- no cpap   Past Surgical History:  Procedure Laterality Date  . COLONOSCOPY    . EYE SURGERY     LEFT AYE AS CHILD   . JOINT REPLACEMENT Bilateral    LT KNEE 3 YRS AGO   . 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 TOTAL KNEE ARTHROPLASTY Right 12/04/2013   Procedure: TOTAL KNEE ARTHROPLASTY;  Surgeon: Hessie Dibble, MD;  Location: [place];  Service: Orthopedics;  Laterality: Right;   Social History:   reports that he quit smoking about 37 years ago. His smoking use included cigarettes. He has a 2.50 pack-year smoking history. He has never used smokeless tobacco. He reports that he does not drink alcohol and does not use drugs.  Family History  Problem Relation Age of Onset  . Stroke Mother   . Heart attack Mother   . Diabetes Mother   . Stroke Father   . Stomach cancer Brother   . Prostate cancer Brother   .  Throat cancer Sister   . Colon cancer Neg Hx   . Esophageal cancer Neg Hx   . Rectal cancer Neg Hx     Medications: Patient's Medications  New Prescriptions   No medications on file  Previous Medications   ASPIRIN EC 325 MG TABLET    Take 325 mg by mouth daily.    ATORVASTATIN (LIPITOR) 20 MG TABLET    Take 1 tablet (20 mg total) by mouth every morning.   PSYLLIUM (METAMUCIL PO)    Take by mouth daily. As needed   TRIPTORELIN (TRELESTAR LA) 11.25 MG INJECTION    Inject 11.25 mg into the muscle. Every 6 months   XTANDI 40 MG CAPSULE    TAKE 4 CAPSULES BY MOUTH ONCE DAILY.  Modified Medications   No  medications on file  Discontinued Medications   No medications on file    Physical Exam:  Vitals:   05/02/20 1029  BP: 110/70  Pulse: 71  Temp: (!) 97 F (36.1 C)  TempSrc: Temporal  SpO2: 98%  Weight: 261 lb 9.6 oz (118.7 kg)  Height: '5\' 9"'  (1.753 m)   Body mass index is 38.63 kg/m. Wt Readings from Last 3 Encounters:  05/02/20 261 lb 9.6 oz (118.7 kg)  04/08/20 259 lb 14.4 oz (117.9 kg)  12/11/19 256 lb 6.4 oz (116.3 kg)    Physical Exam Constitutional:      General: He is not in acute distress.    Appearance: He is well-developed. He is not diaphoretic.  HENT:     Head: Normocephalic and atraumatic.     Right Ear: External ear normal. There is no impacted cerumen.     Left Ear: External ear normal. There is no impacted cerumen.     Nose: Nose normal. No congestion.     Mouth/Throat:     Pharynx: No oropharyngeal exudate.     Comments: Poor dentition  Eyes:     Conjunctiva/sclera: Conjunctivae normal.     Pupils: Pupils are equal, round, and reactive to light.  Cardiovascular:     Rate and Rhythm: Normal rate and regular rhythm.     Heart sounds: Normal heart sounds.  Pulmonary:     Effort: Pulmonary effort is normal.     Breath sounds: Normal breath sounds.  Abdominal:     General: Bowel sounds are normal.     Palpations: Abdomen is soft.  Musculoskeletal:        General: No tenderness.     Cervical back: Normal range of motion and neck supple.  Skin:    General: Skin is warm and dry.  Neurological:     Mental Status: He is alert and oriented to person, place, and time.     Labs reviewed: Basic Metabolic Panel: Recent Labs    12/04/19 1101 04/01/20 1132 04/28/20 0000  NA 136 137 139  K 4.1 4.2 4.3  CL 106 107 107  CO2 '22 22 20  ' GLUCOSE 142* 121* 147*  BUN '13 13 19  ' CREATININE 0.91 0.99 0.75  CALCIUM 9.7 9.3 9.4   Liver Function Tests: Recent Labs    07/31/19 1050 10/31/19 0820 12/04/19 1101 04/01/20 1132 04/28/20 0000  AST 16   <  > '15 15 18  ' ALT 14   < > '12 14 16  ' ALKPHOS 113  --  130* 123  --   BILITOT 0.5   < > 0.4 0.5 0.5  PROT 7.4   < > 7.5 7.6 6.7  ALBUMIN 3.8  --  3.7 3.9  --    < > = values in this interval not displayed.   No results for input(s): LIPASE, AMYLASE in the last 8760 hours. No results for input(s): AMMONIA in the last 8760 hours. CBC: Recent Labs    12/04/19 1101 04/01/20 1132 04/28/20 0000  WBC 6.3 5.2 5.2  NEUTROABS 2.7 2.3 2,740  HGB 14.3 14.5 13.9  HCT 42.4 42.5 40.5  MCV 91.8 90.6 90.6  PLT 239 229 212   Lipid Panel: Recent Labs    10/31/19 0820 04/28/20 0000  CHOL 183 171  HDL 78 74  LDLCALC 88 82  TRIG 84 73  CHOLHDL 2.3 2.3   TSH: No results for input(s): TSH in the last 8760 hours. A1C: Lab Results  Component Value Date   HGBA1C 7.1 (H) 04/28/2020     Assessment/Plan 1. Type 2 diabetes mellitus with diabetic mononeuropathy, without long-term current use of insulin (HCC) -not current on medication. A1c trending up, discussed importance of dietary modifications to avoid medication.  Encouraged dietary compliance, routine foot care/monitoring and to keep up with diabetic eye exams through ophthalmology  - Hemoglobin A1c; Future  2. Atherosclerosis of aorta (HCC) Continues on ASA and statin.   3. Essential hypertension -not currently on medications, blood pressure controlled at this time. Continue dietary modifications.  - CMP with eGFR([facility]); Future - CBC with Differential/Platelet; Future  4. Prostate cancer ([place]) Stable, followed by urology and oncology  5. Mixed hyperlipidemia -encouraged diet modifications with Lipitor to get to goal.  - Lipid Panel; Future - CMP with eGFR([facility]); Future  6. Morbid obesity ([place]) Education provided on dietary modifications and increase in physical activity   Next appt: 6 months, labs prior.  [name]. [place], [facility] [phone]

## 2020-05-02 NOTE — Patient Instructions (Signed)
Make appt with ophthalmology [facility] Address: [address] [place], [place] [address] [phone]  Decrease sweets (ice cream, cookies, etc)

## 2020-05-14 ENCOUNTER — Other Ambulatory Visit: Payer: Self-pay | Admitting: *Deleted

## 2020-05-14 DIAGNOSIS — E782 Mixed hyperlipidemia: Secondary | ICD-10-CM

## 2020-05-14 MED ORDER — ATORVASTATIN CALCIUM 20 MG PO TABS: 20.0000 mg | ORAL_TABLET | Freq: Every morning | ORAL | 1 refills | Status: DC

## 2020-05-14 NOTE — Telephone Encounter (Signed)
Patient requested refill

## 2020-05-15 ENCOUNTER — Other Ambulatory Visit (HOSPITAL_COMMUNITY): Payer: Self-pay

## 2020-05-23 ENCOUNTER — Other Ambulatory Visit (HOSPITAL_COMMUNITY): Payer: Self-pay

## 2020-05-23 MED FILL — Enzalutamide Cap 40 MG: ORAL | 30 days supply | Qty: 120 | Fill #0 | Status: AC

## 2020-05-27 ENCOUNTER — Other Ambulatory Visit (HOSPITAL_COMMUNITY): Payer: Self-pay

## 2020-06-24 ENCOUNTER — Other Ambulatory Visit: Payer: Self-pay | Admitting: Oncology

## 2020-06-24 ENCOUNTER — Other Ambulatory Visit (HOSPITAL_COMMUNITY): Payer: Self-pay

## 2020-06-24 MED ORDER — XTANDI 40 MG PO CAPS
ORAL_CAPSULE | ORAL | 1 refills | Status: DC
  Filled 2020-06-24: qty 120, fill #0
  Filled 2020-06-25: qty 120, 30d supply, fill #0
  Filled 2020-07-22: qty 120, 30d supply, fill #1

## 2020-06-25 ENCOUNTER — Other Ambulatory Visit (HOSPITAL_COMMUNITY): Payer: Self-pay

## 2020-06-26 ENCOUNTER — Other Ambulatory Visit (HOSPITAL_COMMUNITY): Payer: Self-pay

## 2020-07-03 ENCOUNTER — Other Ambulatory Visit (HOSPITAL_COMMUNITY): Payer: Self-pay

## 2020-07-15 ENCOUNTER — Ambulatory Visit (INDEPENDENT_AMBULATORY_CARE_PROVIDER_SITE_OTHER): Payer: Medicare HMO | Admitting: Dentistry

## 2020-07-15 ENCOUNTER — Other Ambulatory Visit: Payer: Self-pay

## 2020-07-15 ENCOUNTER — Encounter (HOSPITAL_COMMUNITY): Payer: Self-pay | Admitting: Dentistry

## 2020-07-15 DIAGNOSIS — K030 Excessive attrition of teeth: Secondary | ICD-10-CM

## 2020-07-15 DIAGNOSIS — C61 Malignant neoplasm of prostate: Secondary | ICD-10-CM | POA: Diagnosis not present

## 2020-07-15 DIAGNOSIS — K029 Dental caries, unspecified: Secondary | ICD-10-CM

## 2020-07-15 DIAGNOSIS — K06020 Generalized gingival recession, unspecified: Secondary | ICD-10-CM

## 2020-07-15 DIAGNOSIS — Z01818 Encounter for other preprocedural examination: Secondary | ICD-10-CM

## 2020-07-15 DIAGNOSIS — K0850 Unsatisfactory restoration of tooth, unspecified: Secondary | ICD-10-CM

## 2020-07-15 DIAGNOSIS — K036 Deposits [accretions] on teeth: Secondary | ICD-10-CM

## 2020-07-15 DIAGNOSIS — K08109 Complete loss of teeth, unspecified cause, unspecified class: Secondary | ICD-10-CM

## 2020-07-15 DIAGNOSIS — K045 Chronic apical periodontitis: Secondary | ICD-10-CM

## 2020-07-15 DIAGNOSIS — K0889 Other specified disorders of teeth and supporting structures: Secondary | ICD-10-CM

## 2020-07-15 DIAGNOSIS — K0530 Chronic periodontitis, unspecified: Secondary | ICD-10-CM

## 2020-07-15 DIAGNOSIS — K032 Erosion of teeth: Secondary | ICD-10-CM

## 2020-07-15 DIAGNOSIS — K083 Retained dental root: Secondary | ICD-10-CM

## 2020-07-15 NOTE — Progress Notes (Signed)
Department of Dental Medicine     OUTPATIENT CONSULTATION  Service Date:   07/15/2020  Patient Name:   Samuel [name] Date of Birth:   1945-05-22 Medical Record Number: [id number]  Referring Provider:               Festus Aloe, MD  TODAY'S VISIT   Assessment:   . There are no current signs of acute odontogenic infection including abscess, edema or erythema, or suspicious lesion requiring biopsy.   . There are multiple teeth with severe decay that are chronically infected and diagnosis of moderate to severe periodontal disease.  Recommendations:   . Extractions of all indicated teeth to decrease the risk of perioperative and postoperative systemic infection and complications in the operating room under general anesthesia. . Establish dental care at an outside office of the patient's choice for routine care including cleanings/periodontal therapy and periodic exams. Plan:   . Discuss case with medical team and coordinate treatment as needed. . Schedule OR for dental procedures under general anesthesia pending medical/oncology team's recommendations.  He will also need an updated H&P within 30 days of the scheduled surgery date (once it is posted).  . Discussed in detail all treatment options and recommendations with the patient and they are agreeable to the plan.    Thank you for consulting with [facility] and for the opportunity to participate in this patient's treatment.  Should you have any questions or concerns, please contact the [facility] at [phone].   PROGRESS NOTE:   COVID-19 SCREENING:  The patient denies symptoms concerning for COVID-19 infection including fever, chills, cough, or newly developed shortness of breath.   HISTORY OF PRESENT ILLNESS: . Samuel [name] is a very pleasant 75 y.o. male with h/o heart murmur, HTN, hyperlipidemia, type 2 diabetes mellitus, arthritis, sleep apnea, hepatitis A and prostate cancer who was recently  diagnosed with osteoporosis at high risk for fracture and is anticipating starting Prolia for treatment.  The patient presents today for a medically necessary dental consultation as part of their pre-denosumab/antiresorptive therapy work-up.   DENTAL HISTORY: . The patient reports that it has been a long time since he last had a dental visit.  He currently denies any dental/orofacial pain or sensitivity. . Patient is able to manage oral secretions.  Patient denies dysphagia, odynophagia, dysphonia, SOB and neck pain.  Patient denies fever, rigors and malaise.   CHIEF COMPLAINT:  . Here for a pre-antiresorptive therapy dental exam.   Patient Active Problem List   Diagnosis Date Noted  . Atherosclerosis of aorta ([place]) 11/02/2019  . Essential hypertension 10/09/2018  . Type 2 diabetes mellitus with diabetic mononeuropathy, without long-term current use of insulin ([place]) 10/13/2016  . Prostate cancer ([place]) 10/13/2016  . Gynecomastia, male 10/13/2016  . Mixed hyperlipidemia 10/13/2016  . 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 Arthritis   . Blind left eye   . Cyst in hand 10/2013   left hand, treated with antibiotic & pain medicine  . Diabetes mellitus without complication ([place])    ?? borderline  being checked- no medications  . Heart murmur   . Hepatitis A    25 YRS AGO  . Hyperlipidemia   . Prostate cancer ([place])   . Sleep apnea    ????   O2 DESAT  10/26/13  IN ED , no sleep study ever done per pt- no cpap   Past Surgical History:  Procedure Laterality Date  .  COLONOSCOPY    . EYE SURGERY     LEFT AYE AS CHILD   . JOINT REPLACEMENT Bilateral    LT KNEE 3 YRS AGO   . 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 TOTAL KNEE ARTHROPLASTY Right 12/04/2013   Procedure: TOTAL KNEE ARTHROPLASTY;  Surgeon: Hessie Dibble, MD;  Location: [facility];  Service: Orthopedics;  Laterality: Right;   Allergies  Allergen Reactions  . Other Other (See  Comments)    Patient stated,"if I touch felt (material) I get "knots" on my body."  . Peach [Prunus Persica] Other (See Comments)    Patient stated,"if I touch or eat peach fuzz, I get "knots" all over my face and mouth."    Current Outpatient Medications  Medication Sig Dispense Refill  . aspirin EC 325 MG tablet Take 325 mg by mouth daily.     Marland Kitchen atorvastatin (LIPITOR) 20 MG tablet Take 1 tablet (20 mg total) by mouth every morning. 90 tablet 1  . enzalutamide (XTANDI) 40 MG capsule TAKE 4 CAPSULES BY MOUTH ONCE DAILY. 120 capsule 1  . Psyllium (METAMUCIL PO) Take by mouth daily. As needed    . triptorelin (TRELESTAR LA) 11.25 MG injection Inject 11.25 mg into the muscle. Every 6 months    . XTANDI 40 MG capsule TAKE 4 CAPSULES BY MOUTH ONCE DAILY. 120 capsule 1   No current facility-administered medications for this visit.    LABS: Lab Results  Component Value Date   WBC 5.2 04/28/2020   HGB 13.9 04/28/2020   HCT 40.5 04/28/2020   MCV 90.6 04/28/2020   PLT 212 04/28/2020      Component Value Date/Time   NA 139 04/28/2020 0000   NA 137 12/15/2016 1421   K 4.3 04/28/2020 0000   K 4.4 12/15/2016 1421   CL 107 04/28/2020 0000   CO2 20 04/28/2020 0000   CO2 19 (L) 12/15/2016 1421   GLUCOSE 147 (H) 04/28/2020 0000   GLUCOSE 104 12/15/2016 1421   BUN 19 04/28/2020 0000   BUN 13.4 12/15/2016 1421   CREATININE 0.75 04/28/2020 0000   CREATININE 0.8 12/15/2016 1421   CALCIUM 9.4 04/28/2020 0000   CALCIUM 9.4 12/15/2016 1421   GFRNONAA 89 04/28/2020 0000   GFRAA 103 04/28/2020 0000   Lab Results  Component Value Date   INR 0.99 11/23/2013   No results found for: PTT  Social History   Socioeconomic History  . Marital status: Widowed    Spouse name: Not on file  . Number of children: Not on file  . Years of education: Not on file  . Highest education level: Not on file  Occupational History  . Not on file  Tobacco Use  . Smoking status: Former Smoker    Packs/day:  0.50    Years: 5.00    Pack years: 2.50    Types: Cigarettes    Quit date: 02/16/1983    Years since quitting: 37.4  . Smokeless tobacco: Never Used  Vaping Use  . Vaping Use: Never used  Substance and Sexual Activity  . Alcohol use: No    Alcohol/week: 0.0 standard drinks  . Drug use: No  . Sexual activity: Not on file  Other Topics Concern  . 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 Stroke Mother   . Heart attack Mother   . Diabetes Mother   . Stroke Father   . Stomach cancer Brother   . Prostate cancer Brother   . Throat cancer Sister   . Colon cancer Neg Hx   . Esophageal cancer Neg Hx   . Rectal cancer Neg Hx      REVIEW OF SYSTEMS:  . Reviewed with the patient as per HPI. Psych: Patient denies having dental phobia.   VITAL SIGNS: BP (!) 145/72 (BP Location: Right Arm)   Pulse 97   Temp 98.9 F (37.2 C) (Oral)    PHYSICAL EXAM: General:  Well-developed, comfortable and in no apparent distress. Neurological:  Alert and oriented to person, place and  time. Extraoral:  Facial symmetry present without any edema or erythema.  No swelling or  lymphadenopathy.  TMJ asymptomatic without clicks or crepitations. Intraoral:  Soft tissues appear well-perfused and mucous membranes moist.  FOM and vestibules soft and not raised. Oral cavity without mass or lesion. No signs of infection, parulis, sinus tract, edema or erythema evident upon exam.   DENTAL EXAM:  . Hard tissue exam completed and charted. Overall impression:  Poor remaining dentition.  Missing teeth, caries, retained root tips, existing restorations.   Oral hygiene:  Poor    Periodontal:  Localized areas of inflamed and erythematous gingival tissue.  Generalized calculus accumulation.  Class 2 mobility on #3, #18 and #20, class 3 mobility on tooth #15.  Severe gingival recession on #3, #14, #15, #20 and #29. Caries:  #3, #4, #6, #7, #8, #9, #10, #11, #14, #15, #18, #20, #21, #22 and #29.  #20, #21 and #22 are restorable. Retained root tips:  #4, #6, #7, #10 and #11 Defective restorations:  #3 and #14 existing amalgam restorations with recurrent decay Removable/fixed prosthodontics:  Patient denies wearing partial dentures.  He does report that he is interested in replacing missing teeth in the future. Occlusion:  Unable to assess molar occlusion.  Non-functional teeth numbers 3 and 14.  Supra-erupted teeth numbers 3, 8, 9, 14, 15,  Other findings:  Attrition/wear: #8I, #22I, #27I, #28O and #29O.  #9 has erosion of enamel (MIFL surfaces).   #28DL fracture and #29DOL fracture to dentin.   RADIOGRAPHIC EXAM:  . PAN and Full Mouth Series exposed and interpreted.  Condyles seated bilaterally in fossas.  All visualized osseous structures appear WNL.  #3, #8, #14, #15 and #18 appear supra-erupted. Right and left side radiopacities above thyroid cartilages consistent with carotid artery calcifications; left side significantly more notable than  right side.  Generalized moderate horizontal bone loss with areas of localized severe consistent with moderate to severe periodontitis; #15  has almost complete vertical bone loss surrounding the mesial root.  Radiographic calculus accumulation present.  Missing teeth, caries, retained root tips- #4, #6, #7, #10 and #11.  #3 and #14 have existing restorations.  #4, #6, #7, #11 and #15 have periapical radiolucencies.   ASSESSMENT:  1.  Prostate cancer 2.  Pre-antiresorptive agent dental exam 3.  Missing teeth 4.  Caries 5.  Retained root tips 6.  Chronic periodontitis 7.  Accretions on teeth 8.  Chronic apical periodontitis 9.  Loose teeth 10.  Defective dental restorations 11. Attrition/wear 12. Erosion 13. Gingival recession   PLAN AND RECOMMENDATIONS: . I discussed the risks, benefits, and complications of various scenarios with the patient in relationship to their medical and dental conditions, which included systemic infection such as endocarditis, bacteremia or other serious issues such as medication-related osteonecrosis of the jaw that could potentially occur either before, during or after their anticipated therapy if dental/oral concerns are not addressed.  I explained that if any chronic or acute dental/oral infection(s) are addressed and subsequently not maintained following medical optimization and recovery, their risk of the previously mentioned complications are just as high and could potentially occur postoperatively.  I explained all significant findings of the dental consultation with the patient including multiple infected teeth with severe cavities/retained root tips, bone loss and loose teeth and inflamed/red gums and the recommended care including multiple extractions of all infected teeth or teeth that are non-restorable/periodontally hopeless in order to optimize them for antiresorptive therapy from a dental standpoint.  The patient verbalized understanding of all findings, discussion, and recommendations. . We then discussed various treatment options to include no treatment, multiple extractions with  alveoloplasty, pre-prosthetic surgery as indicated, periodontal therapy, dental restorations, root canal therapy, crown and bridge therapy, implant therapy, and replacement of missing teeth as indicated.  We also discussed the treatment options of extractions of all remaining teeth vs all maxillary teeth and at least teeth numbers 18 and 29 on the lower to prepare him for a complete upper denture and lower complete vs partial denture.  We also discussed the possibility of treatment in the operating room under general anesthesia due to his medical conditions and the amount of treatment that needs to be completed to clear him for his medical treatment. The patient verbalized understanding of all options, and currently wishes to proceed with extractions of all maxillary teeth, teeth numbers 18 and 29 as well as any other indicated teeth that may be deemed questionable or poor prognosis at the time of his dental procedure with alveoloplasty as needed in the OR under GA as recommended. . Plan to discuss all findings and recommendations with medical team and coordinate future care as needed.  The patient will need to establish care at a dental office of his choice for routine dental care including replacement of missing teeth as needed, cleanings and exams.  o All questions and concerns were invited and addressed.  The patient tolerated today's visit well and departed in stable condition.   I spent in excess of 120 minutes during the conduct of this consultation and >50% of this time involved direct face-to-face encounter for counseling and/or coordination of the patient's care. [place] Benson Norway, D.M.D.

## 2020-07-15 NOTE — Patient Instructions (Signed)
[place] Benson Norway, D.M.D. Phone: [phone] Fax: [phone]   It was a pleasure seeing you today!  Please refer to the information below regarding your dental visit with [place].  Call us if any questions or concerns come up after you leave.   Thank you for letting us provide care for you.  If there is anything we can do for you, please let us know.    XGEVA OR IV BISPHOSPHONATE THERAPY AND INFORMATION REGARDING YOUR TEETH   Before you start on your new therapy, there are a few things you should know: 1. Bisphosphonate drugs appear to adversely affect the ability of the jaw bones to break down or remodel themselves, therefore reducing or eliminating their ordinary healing capacity and the ability to maintain normal health. 2. This risk is increased after surgery, such as extractions, implant placement or other "invasive" procedures that might cause only mild trauma to the bone, or surgery on your gums, there is a significant risk of severe complications.  One of these complications includes necrosis or exposure of the bone (osteonecrosis) and subsequent soft tissue and/or bone infection may result. 3. Once osteonecrosis begins, this is a long-term, destructive process in the jawbone that is very difficult or impossible to eliminate.    Each of these things are some of the reasons why it is so important to see a dentist before starting your therapy.  It is important to address all of your oral health needs such as taking out any bad or infected teeth with large cavities, discussing the health of your gums and taking out any teeth that are loose or may become infected in the future, and recognizing the importance of visiting the dentist every 4-6 months after starting therapy to make sure you are maintaining optimal oral health.   QUESTIONS?  Call our office during office hours at [phone].

## 2020-07-22 ENCOUNTER — Other Ambulatory Visit (HOSPITAL_COMMUNITY): Payer: Self-pay

## 2020-07-28 ENCOUNTER — Other Ambulatory Visit (HOSPITAL_COMMUNITY): Payer: Self-pay

## 2020-08-05 ENCOUNTER — Other Ambulatory Visit: Payer: Self-pay

## 2020-08-05 ENCOUNTER — Inpatient Hospital Stay: Payer: Medicare HMO | Attending: Oncology

## 2020-08-05 DIAGNOSIS — I10 Essential (primary) hypertension: Secondary | ICD-10-CM | POA: Insufficient documentation

## 2020-08-05 DIAGNOSIS — C61 Malignant neoplasm of prostate: Secondary | ICD-10-CM

## 2020-08-05 LAB — CBC WITH DIFFERENTIAL (CANCER CENTER ONLY)
Abs Immature Granulocytes: 0.04 10*3/uL (ref 0.00–0.07)
Basophils Absolute: 0.1 10*3/uL (ref 0.0–0.1)
Basophils Relative: 1 %
Eosinophils Absolute: 0.1 10*3/uL (ref 0.0–0.5)
Eosinophils Relative: 2 %
HCT: 42.4 % (ref 39.0–52.0)
Hemoglobin: 14.5 g/dL (ref 13.0–17.0)
Immature Granulocytes: 1 %
Lymphocytes Relative: 40 %
Lymphs Abs: 2.5 10*3/uL (ref 0.7–4.0)
MCH: 31.1 pg (ref 26.0–34.0)
MCHC: 34.2 g/dL (ref 30.0–36.0)
MCV: 91 fL (ref 80.0–100.0)
Monocytes Absolute: 0.6 10*3/uL (ref 0.1–1.0)
Monocytes Relative: 9 %
Neutro Abs: 3 10*3/uL (ref 1.7–7.7)
Neutrophils Relative %: 47 %
Platelet Count: 246 10*3/uL (ref 150–400)
RBC: 4.66 MIL/uL (ref 4.22–5.81)
RDW: 13.1 % (ref 11.5–15.5)
WBC Count: 6.3 10*3/uL (ref 4.0–10.5)
nRBC: 0 % (ref 0.0–0.2)

## 2020-08-05 LAB — CMP (CANCER CENTER ONLY)
ALT: 21 U/L (ref 0–44)
AST: 20 U/L (ref 15–41)
Albumin: 4.2 g/dL (ref 3.5–5.0)
Alkaline Phosphatase: 140 U/L — ABNORMAL HIGH (ref 38–126)
Anion gap: 8 (ref 5–15)
BUN: 16 mg/dL (ref 8–23)
CO2: 23 mmol/L (ref 22–32)
Calcium: 9.6 mg/dL (ref 8.9–10.3)
Chloride: 105 mmol/L (ref 98–111)
Creatinine: 0.86 mg/dL (ref 0.61–1.24)
GFR, Estimated: >60 mL/min (ref >60)
Glucose, Bld: 161 mg/dL — ABNORMAL HIGH (ref 70–99)
Potassium: 4.1 mmol/L (ref 3.5–5.1)
Sodium: 136 mmol/L (ref 135–145)
Total Bilirubin: 0.6 mg/dL (ref 0.3–1.2)
Total Protein: 7.7 g/dL (ref 6.5–8.1)

## 2020-08-06 LAB — PROSTATE-SPECIFIC AG, SERUM (LABCORP): Prostate Specific Ag, Serum: 3.6 ng/mL (ref 0.0–4.0)

## 2020-08-12 ENCOUNTER — Inpatient Hospital Stay (HOSPITAL_BASED_OUTPATIENT_CLINIC_OR_DEPARTMENT_OTHER): Payer: Medicare HMO | Admitting: Oncology

## 2020-08-12 ENCOUNTER — Other Ambulatory Visit: Payer: Self-pay

## 2020-08-12 VITALS — BP 159/66 | HR 82 | Temp 98.1°F | Resp 17 | Ht 69.0 in | Wt 261.5 lb

## 2020-08-12 DIAGNOSIS — C61 Malignant neoplasm of prostate: Secondary | ICD-10-CM

## 2020-08-12 DIAGNOSIS — I10 Essential (primary) hypertension: Secondary | ICD-10-CM | POA: Diagnosis not present

## 2020-08-12 NOTE — Progress Notes (Signed)
Hematology and Oncology Follow Up Visit  Samuel [name] [id number] August 19, 1945 75 y.o. 08/12/2020 9:24 AM Samuel [name], Samuel [name], NPEubanks, Samuel [name], NP   Principle Diagnosis: 75 year old man with advanced prostate cancer diagnosed in 2010.  He has castration-resistant disease with biochemical relapse in 2018.  Prior Therapy: He was treated with external beam radiation and androgen deprivation.  He developed biochemical relapse with a PSA rise and he has been on combined androgen deprivation with [place] agonist on bicalutamide. PSA in June 2016 was 6.11, in December 2016 was 10, in June 2017 was 9 and in September 2017 was up to 12.  Staging workup including CT scan of the abdomen and pelvis as well as bone scan showed no evidence of metastatic disease.  His PSA was up to 22.5 in April 2018.   Current therapy:   Androgen Deprivation given under the care of Dr. Junious Silk.   Xtandi 160 mg daily started in May 2018.  Interim History: Samuel [name] returns today for a follow-up visit.  Since the last visit, he reports no major changes in his health.  He continues to tolerate Xtandi without any recent complaints.  He denies any bone pain or pathological fractures.  He denies any recent hospitalizations or illnesses.  His performance status quality of life remains unchanged.               Medications: Updated on review. Current Outpatient Medications  Medication Sig Dispense Refill   aspirin EC 325 MG tablet Take 325 mg by mouth daily.      atorvastatin (LIPITOR) 20 MG tablet Take 1 tablet (20 mg total) by mouth every morning. 90 tablet 1   enzalutamide (XTANDI) 40 MG capsule TAKE 4 CAPSULES BY MOUTH ONCE DAILY. 120 capsule 1   Psyllium (METAMUCIL PO) Take by mouth daily. As needed     triptorelin (TRELESTAR LA) 11.25 MG injection Inject 11.25 mg into the muscle. Every 6 months     XTANDI 40 MG capsule TAKE 4 CAPSULES BY MOUTH ONCE DAILY. 120 capsule 1   No current facility-administered  medications for this visit.     Allergies:  Allergies  Allergen Reactions   Other Other (See Comments)    Patient stated,"if I touch felt (material) I get "knots" on my body."   Peach [Prunus Persica] Other (See Comments)    Patient stated,"if I touch or eat peach fuzz, I get "knots" all over my face and mouth."        Physical Exam:      Blood pressure (!) 159/66, pulse 82, temperature 98.1 F (36.7 C), temperature source Tympanic, resp. rate 17, height 5\' 9"  (1.753 m), weight 261 lb 8 oz (118.6 kg), SpO2 98 %.     ECOG: 1    General appearance: Comfortable appearing without any discomfort Head: Normocephalic without any trauma Oropharynx: Mucous membranes are moist and pink without any thrush or ulcers. Eyes: Pupils are equal and round reactive to light. Lymph nodes: No cervical, supraclavicular, inguinal or axillary lymphadenopathy.   Heart:regular rate and rhythm.  S1 and S2 without leg edema. Lung: Clear without any rhonchi or wheezes.  No dullness to percussion. Abdomin: Soft, nontender, nondistended with good bowel sounds.  No hepatosplenomegaly. Musculoskeletal: No joint deformity or effusion.  Full range of motion noted. Neurological: No deficits noted on motor, sensory and deep tendon reflex exam. Skin: No petechial rash or dryness.  Appeared moist.             Lab Results: Lab Results  Component Value Date   WBC 6.3 08/05/2020   HGB 14.5 08/05/2020   HCT 42.4 08/05/2020   MCV 91.0 08/05/2020   PLT 246 08/05/2020     Chemistry      Component Value Date/Time   NA 136 08/05/2020 1143   NA 137 12/15/2016 1421   K 4.1 08/05/2020 1143   K 4.4 12/15/2016 1421   CL 105 08/05/2020 1143   CO2 23 08/05/2020 1143   CO2 19 (L) 12/15/2016 1421   BUN 16 08/05/2020 1143   BUN 13.4 12/15/2016 1421   CREATININE 0.86 08/05/2020 1143   CREATININE 0.75 04/28/2020 0000   CREATININE 0.8 12/15/2016 1421      Component Value Date/Time   CALCIUM 9.6  08/05/2020 1143   CALCIUM 9.4 12/15/2016 1421   ALKPHOS 140 (H) 08/05/2020 1143   ALKPHOS 92 12/15/2016 1421   AST 20 08/05/2020 1143   AST 17 12/15/2016 1421   ALT 21 08/05/2020 1143   ALT 12 12/15/2016 1421   BILITOT 0.6 08/05/2020 1143   BILITOT 0.37 12/15/2016 1421        Results for Samuel [name] (MRN [id number]) as of 08/12/2020 09:19  Ref. Range 04/01/2020 11:32 08/05/2020 11:43  Prostate Specific Ag, Serum Latest Ref Range: 0.0 - 4.0 ng/mL 3.6 3.6        Impression and Plan:   75 year old man with   1.  Advanced prostate cancer with biochemical relapse diagnosed in 2018.  He has castration-resistant disease at this time.   He has tolerated Xtandi without any recent complaints with PSA remains overall stable.  Laboratory data from August 05, 2020 were reviewed continues to show a PSA of 3.6 which has not dramatically changed in the last 12 months.  At this time, I recommended continued active surveillance and reinstitute different salvage therapy options if he has a rapid increase in his PSA.  Staging scans with PSMA PET will be introduced at that time.  Salvage options including Taxotere chemotherapy, Xofigo, PARP inhibitor all will be evaluated at that time.  For the time being he is agreeable to continue.   2. Androgen depravation: He continues to receive that under the care of Dr. Junious Silk.  I recommended continuing this indefinitely.  3.  Hypertension: Blood pressure is mildly elevated but otherwise under control between visits.  4. Follow-up: In 4 months for repeat follow-up.  30  minutes were dedicated to this visit.  Time spent on reviewing laboratory data, disease status update, salvage therapy options and future plan of care discussion.  Zola Button, MD 6/28/20229:24 AM

## 2020-08-12 NOTE — H&P (View-Only) (Signed)
Hematology and Oncology Follow Up Visit  Samuel [name] [id number] 12-08-1945 75 y.o. 08/12/2020 9:24 AM Samuel [name], Samuel [name], Samuel [name], Samuel [name], Samuel [name]   Principle Diagnosis: 75 year old man with advanced prostate cancer diagnosed in 2010.  He has castration-resistant disease with biochemical relapse in 2018.  Prior Therapy: He was treated with external beam radiation and androgen deprivation.  He developed biochemical relapse with a PSA rise and he has been on combined androgen deprivation with [place] agonist on bicalutamide. PSA in June 2016 was 6.11, in December 2016 was 10, in June 2017 was 9 and in September 2017 was up to 12.  Staging workup including CT scan of the abdomen and pelvis as well as bone scan showed no evidence of metastatic disease.  His PSA was up to 22.5 in April 2018.   Current therapy:   Androgen Deprivation given under the care of Dr. Junious Silk.   Xtandi 160 mg daily started in May 2018.  Interim History: Samuel [name] returns today for a follow-up visit.  Since the last visit, he reports no major changes in his health.  He continues to tolerate Xtandi without any recent complaints.  He denies any bone pain or pathological fractures.  He denies any recent hospitalizations or illnesses.  His performance status quality of life remains unchanged.               Medications: Updated on review. Current Outpatient Medications  Medication Sig Dispense Refill   aspirin EC 325 MG tablet Take 325 mg by mouth daily.      atorvastatin (LIPITOR) 20 MG tablet Take 1 tablet (20 mg total) by mouth every morning. 90 tablet 1   enzalutamide (XTANDI) 40 MG capsule TAKE 4 CAPSULES BY MOUTH ONCE DAILY. 120 capsule 1   Psyllium (METAMUCIL PO) Take by mouth daily. As needed     triptorelin (TRELESTAR LA) 11.25 MG injection Inject 11.25 mg into the muscle. Every 6 months     XTANDI 40 MG capsule TAKE 4 CAPSULES BY MOUTH ONCE DAILY. 120 capsule 1   No current facility-administered  medications for this visit.     Allergies:  Allergies  Allergen Reactions   Other Other (See Comments)    Patient stated,"if I touch felt (material) I get "knots" on my body."   Peach [Prunus Persica] Other (See Comments)    Patient stated,"if I touch or eat peach fuzz, I get "knots" all over my face and mouth."        Physical Exam:      Blood pressure (!) 159/66, pulse 82, temperature 98.1 F (36.7 C), temperature source Tympanic, resp. rate 17, height 5\' 9"  (1.753 m), weight 261 lb 8 oz (118.6 kg), SpO2 98 %.     ECOG: 1    General appearance: Comfortable appearing without any discomfort Head: Normocephalic without any trauma Oropharynx: Mucous membranes are moist and pink without any thrush or ulcers. Eyes: Pupils are equal and round reactive to light. Lymph nodes: No cervical, supraclavicular, inguinal or axillary lymphadenopathy.   Heart:regular rate and rhythm.  S1 and S2 without leg edema. Lung: Clear without any rhonchi or wheezes.  No dullness to percussion. Abdomin: Soft, nontender, nondistended with good bowel sounds.  No hepatosplenomegaly. Musculoskeletal: No joint deformity or effusion.  Full range of motion noted. Neurological: No deficits noted on motor, sensory and deep tendon reflex exam. Skin: No petechial rash or dryness.  Appeared moist.             Lab Results: Lab Results  Component Value Date   WBC 6.3 08/05/2020   HGB 14.5 08/05/2020   HCT 42.4 08/05/2020   MCV 91.0 08/05/2020   PLT 246 08/05/2020     Chemistry      Component Value Date/Time   NA 136 08/05/2020 1143   NA 137 12/15/2016 1421   K 4.1 08/05/2020 1143   K 4.4 12/15/2016 1421   CL 105 08/05/2020 1143   CO2 23 08/05/2020 1143   CO2 19 (L) 12/15/2016 1421   BUN 16 08/05/2020 1143   BUN 13.4 12/15/2016 1421   CREATININE 0.86 08/05/2020 1143   CREATININE 0.75 04/28/2020 0000   CREATININE 0.8 12/15/2016 1421      Component Value Date/Time   CALCIUM 9.6  08/05/2020 1143   CALCIUM 9.4 12/15/2016 1421   ALKPHOS 140 (H) 08/05/2020 1143   ALKPHOS 92 12/15/2016 1421   AST 20 08/05/2020 1143   AST 17 12/15/2016 1421   ALT 21 08/05/2020 1143   ALT 12 12/15/2016 1421   BILITOT 0.6 08/05/2020 1143   BILITOT 0.37 12/15/2016 1421        Results for Samuel [name], Samuel [name] (MRN [id number]) as of 08/12/2020 09:19  Ref. Range 04/01/2020 11:32 08/05/2020 11:43  Prostate Specific Ag, Serum Latest Ref Range: 0.0 - 4.0 ng/mL 3.6 3.6        Impression and Plan:   75 year old man with   1.  Advanced prostate cancer with biochemical relapse diagnosed in 2018.  He has castration-resistant disease at this time.   He has tolerated Xtandi without any recent complaints with PSA remains overall stable.  Laboratory data from August 05, 2020 were reviewed continues to show a PSA of 3.6 which has not dramatically changed in the last 12 months.  At this time, I recommended continued active surveillance and reinstitute different salvage therapy options if he has a rapid increase in his PSA.  Staging scans with PSMA PET will be introduced at that time.  Salvage options including Taxotere chemotherapy, Xofigo, PARP inhibitor all will be evaluated at that time.  For the time being he is agreeable to continue.   2. Androgen depravation: He continues to receive that under the care of Dr. Junious Silk.  I recommended continuing this indefinitely.  3.  Hypertension: Blood pressure is mildly elevated but otherwise under control between visits.  4. Follow-up: In 4 months for repeat follow-up.  30  minutes were dedicated to this visit.  Time spent on reviewing laboratory data, disease status update, salvage therapy options and future plan of care discussion.  Zola Button, MD 6/28/20229:24 AM

## 2020-08-17 ENCOUNTER — Other Ambulatory Visit: Payer: Self-pay

## 2020-08-17 ENCOUNTER — Encounter (HOSPITAL_COMMUNITY): Payer: Self-pay | Admitting: Dentistry

## 2020-08-17 DIAGNOSIS — C61 Malignant neoplasm of prostate: Secondary | ICD-10-CM | POA: Diagnosis not present

## 2020-08-17 NOTE — Progress Notes (Signed)
SDW CALL  Patient was given pre-op instructions over the phone. The opportunity was given for the patient to ask questions. No further questions asked. Patient verbalized understanding of instructions given.   PCP - Sherrie Mustache NP Cardiologist - denies  Chest x-ray - denies EKG - DOS Stress Test - denies ECHO - denies Cardiac Cath - denies  Sleep Study - never had  Doesn't check his blood sugars  Aspirin Instructions: LD 08/16/20   COVID TEST- not needed   Anesthesia review: yes  Patient denies shortness of breath, fever, cough and chest pain over the phone call   All instructions explained to the patient, with a verbal understanding of the material. Patient agrees to go over the instructions while at home for a better understanding. Patient also instructed to self quarantine after being tested for COVID-19. The opportunity to ask questions was provided.

## 2020-08-19 ENCOUNTER — Encounter (HOSPITAL_COMMUNITY): Payer: Self-pay | Admitting: Dentistry

## 2020-08-19 NOTE — Anesthesia Preprocedure Evaluation (Addendum)
Anesthesia Evaluation  Patient identified by MRN, date of birth, ID band Patient awake    Reviewed: Allergy & Precautions, NPO status , Patient's Chart, lab work & pertinent test results  Airway Mallampati: I  TM Distance: >3 FB Neck ROM: Full    Dental  (+) Dental Advisory Given, Loose, Missing, Chipped,    Pulmonary sleep apnea (no CPAP) , former smoker,    Pulmonary exam normal breath sounds clear to auscultation       Cardiovascular hypertension, Normal cardiovascular exam Rhythm:Regular Rate:Normal     Neuro/Psych negative neurological ROS  negative psych ROS   GI/Hepatic negative GI ROS, (+) Hepatitis -, A  Endo/Other  diabetes, Well Controlled, Type 2  Renal/GU negative Renal ROS  negative genitourinary   Musculoskeletal  (+) Arthritis ,   Abdominal   Peds  Hematology negative hematology ROS (+)   Anesthesia Other Findings History includes former smoker (quit 02/16/83), prostate cancer (diagnosed 2010, s/p external beam radiation & androgen deprivation; biochemical relapse 2018, s/p combined androgen deprivation and Xtandi), Hepatitis A (> 20 years ago), 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   Reproductive/Obstetrics                           Anesthesia Physical Anesthesia Plan  ASA: 3  Anesthesia Plan: General   Post-op Pain Management:    Induction: Intravenous  PONV Risk Score and Plan: 2 and Dexamethasone, Ondansetron and Treatment may vary due to age or medical condition  Airway Management Planned: Nasal ETT  Additional Equipment:   Intra-op Plan:   Post-operative Plan: Extubation in OR  Informed Consent: I have reviewed the patients History and Physical, chart, labs and discussed the procedure  including the risks, benefits and alternatives for the proposed anesthesia with the patient or authorized representative who has indicated his/her understanding and acceptance.     Dental advisory given  Plan Discussed with: CRNA  Anesthesia Plan Comments: ( )       Anesthesia Quick Evaluation

## 2020-08-19 NOTE — Progress Notes (Signed)
Anesthesia Chart Review:  Case: [id number] Date/Time: 08/21/20 0715   Procedure: MULTIPLE EXTRACTION WITH ALVEOLOPLASTY   Anesthesia type: General   Pre-op diagnosis: DENTAL CARIES PERIODONTAL DISEASE   Location: [facility] OR ROOM 07 / [facility]   Surgeons: Charlaine Dalton, DMD       DISCUSSION: Patient is a 75 year old male scheduled for the above procedure. Seen by Dr. Benson Norway on 07/15/20 and most recently by his oncologist Dr. Alen Blew with updated exam on 08/12/20.   History includes former smoker (quit 02/16/83), prostate cancer (diagnosed 2010, s/p external beam radiation & androgen deprivation; biochemical relapse 2018, s/p combined androgen deprivation and Xtandi), Hepatitis A (> 20 years ago), left eye blindness (since age 52 due to trauma), HLD, murmur (denied prior echo; 1/6 SEM documented by [name], NP 04/10/18, no testing ordered), DM2 (diet controlled, A1c 7.1% 04/28/20), TKR (right 12/04/13).   He is a same day work-up, so anesthesia team to evaluate on the day of surgery. [facility]. A1c 7.1% on 04/28/20, PCP discussed importance of dietary modifications if wants to avoid DM medications.    VS:  BP Readings from Last 3 Encounters:  08/12/20 (!) 159/66  07/15/20 (!) 145/72  05/02/20 110/70   Pulse Readings from Last 3 Encounters:  08/12/20 82  07/15/20 97  05/02/20 71     PROVIDERS: [name], NP is PCP. Previously saw Dr. Kennon Holter prior to 2017.   Zola Button, MD is HEM-ONC. Last visit 08/12/20. Continue currently therapy for prostate cancer.  Festus Aloe, MD is urologist   LABS: Last labs from 08/05/20 reviewed and results included: Lab Results  Component Value Date   WBC 6.3 08/05/2020   HGB 14.5 08/05/2020   HCT 42.4 08/05/2020   PLT 246 08/05/2020   GLUCOSE 161 (H) 08/05/2020   ALT 21 08/05/2020   AST 20 08/05/2020   NA 136 08/05/2020   K 4.1 08/05/2020   CL 105 08/05/2020   CREATININE 0.86 08/05/2020    BUN 16 08/05/2020   CO2 23 08/05/2020   HGBA1C 7.1 (H) 04/28/2020     EKG: For day of surgery. Last EKG noted was 11/07/17 showing NSR.)   CV: N/A   Past Medical History:  Diagnosis Date   Arthritis    Blind left eye    Cyst in hand 10/2013   left hand, treated with antibiotic & pain medicine   Heart murmur    Hepatitis A    25 YRS AGO   Hyperlipidemia    Pre-diabetes    Prostate cancer ([place])    Sleep apnea    ????   O2 DESAT  10/26/13  IN ED , no sleep study ever done per pt- no cpap    Past Surgical History:  Procedure Laterality Date   COLONOSCOPY     EYE SURGERY     LEFT AYE AS CHILD    JOINT REPLACEMENT Bilateral    LT KNEE 3 YRS AGO    TOTAL KNEE ARTHROPLASTY Right 12/04/2013   dr Rhona Raider   TOTAL KNEE ARTHROPLASTY Right 12/04/2013   Procedure: TOTAL KNEE ARTHROPLASTY;  Surgeon: Hessie Dibble, MD;  Location: [facility];  Service: Orthopedics;  Laterality: Right;    MEDICATIONS: No current facility-administered medications for this encounter.    aspirin EC 325 MG tablet   atorvastatin (LIPITOR) 20 MG tablet   enzalutamide (XTANDI) 40 MG capsule   triptorelin (TRELESTAR LA) 11.25 MG injection  XTANDI 40 MG capsule   Myra Gianotti, PA-C Surgical Short Stay/Anesthesiology [facility] Phone [phone] [facility] Phone [phone] 08/19/2020 1:36 PM

## 2020-08-21 ENCOUNTER — Ambulatory Visit (HOSPITAL_COMMUNITY): Payer: Medicare HMO | Admitting: Vascular Surgery

## 2020-08-21 ENCOUNTER — Other Ambulatory Visit: Payer: Self-pay

## 2020-08-21 ENCOUNTER — Other Ambulatory Visit (HOSPITAL_COMMUNITY): Payer: Self-pay

## 2020-08-21 ENCOUNTER — Ambulatory Visit (HOSPITAL_COMMUNITY)
Admission: RE | Admit: 2020-08-21 | Discharge: 2020-08-21 | Disposition: A | Discharge status: Home / Self Care | Payer: Medicare HMO | Attending: Dentistry | Admitting: Dentistry

## 2020-08-21 ENCOUNTER — Encounter (HOSPITAL_COMMUNITY): Payer: Self-pay | Admitting: Dentistry

## 2020-08-21 ENCOUNTER — Encounter (HOSPITAL_COMMUNITY)
Admission: RE | Disposition: A | Discharge status: Home / Self Care | Payer: Self-pay | Source: Home / Self Care | Attending: Dentistry

## 2020-08-21 DIAGNOSIS — I700 Atherosclerosis of aorta: Secondary | ICD-10-CM | POA: Diagnosis not present

## 2020-08-21 DIAGNOSIS — Z87891 Personal history of nicotine dependence: Secondary | ICD-10-CM | POA: Insufficient documentation

## 2020-08-21 DIAGNOSIS — E1141 Type 2 diabetes mellitus with diabetic mononeuropathy: Secondary | ICD-10-CM | POA: Insufficient documentation

## 2020-08-21 DIAGNOSIS — C61 Malignant neoplasm of prostate: Secondary | ICD-10-CM | POA: Diagnosis not present

## 2020-08-21 DIAGNOSIS — I10 Essential (primary) hypertension: Secondary | ICD-10-CM | POA: Insufficient documentation

## 2020-08-21 DIAGNOSIS — K0530 Chronic periodontitis, unspecified: Secondary | ICD-10-CM | POA: Insufficient documentation

## 2020-08-21 DIAGNOSIS — E782 Mixed hyperlipidemia: Secondary | ICD-10-CM | POA: Insufficient documentation

## 2020-08-21 DIAGNOSIS — Z79899 Other long term (current) drug therapy: Secondary | ICD-10-CM | POA: Diagnosis not present

## 2020-08-21 DIAGNOSIS — K056 Periodontal disease, unspecified: Secondary | ICD-10-CM | POA: Diagnosis not present

## 2020-08-21 DIAGNOSIS — Z7982 Long term (current) use of aspirin: Secondary | ICD-10-CM | POA: Insufficient documentation

## 2020-08-21 DIAGNOSIS — K029 Dental caries, unspecified: Secondary | ICD-10-CM

## 2020-08-21 DIAGNOSIS — M898X8 Other specified disorders of bone, other site: Secondary | ICD-10-CM | POA: Insufficient documentation

## 2020-08-21 DIAGNOSIS — I4510 Unspecified right bundle-branch block: Secondary | ICD-10-CM | POA: Insufficient documentation

## 2020-08-21 HISTORY — DX: Prediabetes: R73.03

## 2020-08-21 HISTORY — PX: MULTIPLE EXTRACTIONS WITH ALVEOLOPLASTY: SHX5342

## 2020-08-21 LAB — GLUCOSE, CAPILLARY
Glucose-Capillary: 165 mg/dL — ABNORMAL HIGH (ref 70–99)
Glucose-Capillary: 182 mg/dL — ABNORMAL HIGH (ref 70–99)

## 2020-08-21 SURGERY — MULTIPLE EXTRACTION WITH ALVEOLOPLASTY
Anesthesia: General | Site: Mouth

## 2020-08-21 MED ORDER — CHLORHEXIDINE GLUCONATE 0.12 % MT SOLN: 15.0000 mL | Freq: Once | OROMUCOSAL | Status: AC

## 2020-08-21 MED ORDER — PROPOFOL 10 MG/ML IV BOLUS
INTRAVENOUS | Status: AC
  Filled 2020-08-21: qty 20

## 2020-08-21 MED ORDER — LIDOCAINE HCL (PF) 2 % IJ SOLN
INTRAMUSCULAR | Status: DC | PRN
  Administered 2020-08-21: 80 mg via INTRADERMAL

## 2020-08-21 MED ORDER — BUPIVACAINE-EPINEPHRINE (PF) 0.5% -1:200000 IJ SOLN
INTRAMUSCULAR | Status: AC
  Filled 2020-08-21: qty 3.6

## 2020-08-21 MED ORDER — ROCURONIUM BROMIDE 10 MG/ML (PF) SYRINGE
PREFILLED_SYRINGE | INTRAVENOUS | Status: AC
  Filled 2020-08-21: qty 10

## 2020-08-21 MED ORDER — PROPOFOL 10 MG/ML IV BOLUS
INTRAVENOUS | Status: DC | PRN
  Administered 2020-08-21: 20 mg via INTRAVENOUS
  Administered 2020-08-21: 150 mg via INTRAVENOUS

## 2020-08-21 MED ORDER — FENTANYL CITRATE (PF) 100 MCG/2ML IJ SOLN
INTRAMUSCULAR | Status: DC | PRN
  Administered 2020-08-21 (×2): 50 ug via INTRAVENOUS
  Administered 2020-08-21: 25 ug via INTRAVENOUS

## 2020-08-21 MED ORDER — ONDANSETRON HCL 4 MG/2ML IJ SOLN
INTRAMUSCULAR | Status: AC
  Filled 2020-08-21: qty 2

## 2020-08-21 MED ORDER — EPHEDRINE SULFATE-NACL 50-0.9 MG/10ML-% IV SOSY
PREFILLED_SYRINGE | INTRAVENOUS | Status: DC | PRN
  Administered 2020-08-21: 10 mg via INTRAVENOUS

## 2020-08-21 MED ORDER — CEFAZOLIN IN SODIUM CHLORIDE 3-0.9 GM/100ML-% IV SOLN
3.0000 g | INTRAVENOUS | Status: AC
  Administered 2020-08-21: 3 g via INTRAVENOUS
  Filled 2020-08-21: qty 100

## 2020-08-21 MED ORDER — LIDOCAINE-EPINEPHRINE 2 %-1:100000 IJ SOLN
INTRAMUSCULAR | Status: DC | PRN
  Administered 2020-08-21: 5.1 mL

## 2020-08-21 MED ORDER — PHENYLEPHRINE HCL-NACL 10-0.9 MG/250ML-% IV SOLN
INTRAVENOUS | Status: DC | PRN
  Administered 2020-08-21: 50 ug/min via INTRAVENOUS

## 2020-08-21 MED ORDER — HYDROCODONE-ACETAMINOPHEN 5-325 MG PO TABS: 1.0000 | ORAL_TABLET | Freq: Four times a day (QID) | ORAL | 0 refills | Status: AC | PRN

## 2020-08-21 MED ORDER — HEMOSTATIC AGENTS (NO CHARGE) OPTIME
TOPICAL | Status: DC | PRN
  Administered 2020-08-21: 1 via TOPICAL

## 2020-08-21 MED ORDER — OXYMETAZOLINE HCL 0.05 % NA SOLN
NASAL | Status: DC | PRN
  Administered 2020-08-21 (×3): 2 via NASAL

## 2020-08-21 MED ORDER — LIDOCAINE-EPINEPHRINE 2 %-1:100000 IJ SOLN
INTRAMUSCULAR | Status: AC
  Filled 2020-08-21: qty 10.2

## 2020-08-21 MED ORDER — OXYMETAZOLINE HCL 0.05 % NA SOLN
NASAL | Status: DC | PRN
  Administered 2020-08-21: 1 via TOPICAL

## 2020-08-21 MED ORDER — ROCURONIUM BROMIDE 10 MG/ML (PF) SYRINGE
PREFILLED_SYRINGE | INTRAVENOUS | Status: DC | PRN
  Administered 2020-08-21 (×2): 10 mg via INTRAVENOUS
  Administered 2020-08-21: 80 mg via INTRAVENOUS

## 2020-08-21 MED ORDER — LACTATED RINGERS IV SOLN: INTRAVENOUS | Status: DC

## 2020-08-21 MED ORDER — BUPIVACAINE-EPINEPHRINE 0.5% -1:200000 IJ SOLN
INTRAMUSCULAR | Status: DC | PRN
  Administered 2020-08-21: 1.8 mL

## 2020-08-21 MED ORDER — SUGAMMADEX SODIUM 500 MG/5ML IV SOLN
INTRAVENOUS | Status: DC | PRN
  Administered 2020-08-21: 240 mg via INTRAVENOUS

## 2020-08-21 MED ORDER — OXYMETAZOLINE HCL 0.05 % NA SOLN
NASAL | Status: AC
  Filled 2020-08-21: qty 30

## 2020-08-21 MED ORDER — DEXAMETHASONE SODIUM PHOSPHATE 10 MG/ML IJ SOLN
INTRAMUSCULAR | Status: DC | PRN
  Administered 2020-08-21: 5 mg via INTRAVENOUS

## 2020-08-21 MED ORDER — CHLORHEXIDINE GLUCONATE 0.12 % MT SOLN
OROMUCOSAL | Status: AC
  Administered 2020-08-21: 15 mL via OROMUCOSAL
  Filled 2020-08-21: qty 15

## 2020-08-21 MED ORDER — 0.9 % SODIUM CHLORIDE (POUR BTL) OPTIME
TOPICAL | Status: DC | PRN
  Administered 2020-08-21: 1000 mL

## 2020-08-21 MED ORDER — ONDANSETRON HCL 4 MG/2ML IJ SOLN
INTRAMUSCULAR | Status: DC | PRN
  Administered 2020-08-21: 4 mg via INTRAVENOUS

## 2020-08-21 MED ORDER — ACETAMINOPHEN 500 MG PO TABS
1000.0000 mg | ORAL_TABLET | Freq: Once | ORAL | Status: AC
  Administered 2020-08-21: 1000 mg via ORAL
  Filled 2020-08-21: qty 2

## 2020-08-21 MED ORDER — FENTANYL CITRATE (PF) 100 MCG/2ML IJ SOLN: 25.0000 ug | INTRAMUSCULAR | Status: DC | PRN

## 2020-08-21 MED ORDER — ORAL CARE MOUTH RINSE
15.0000 mL | Freq: Once | OROMUCOSAL | Status: AC
Start: 2020-08-21 — End: 2020-08-21

## 2020-08-21 MED ORDER — DEXAMETHASONE SODIUM PHOSPHATE 10 MG/ML IJ SOLN
INTRAMUSCULAR | Status: AC
  Filled 2020-08-21: qty 1

## 2020-08-21 MED ORDER — FENTANYL CITRATE (PF) 250 MCG/5ML IJ SOLN
INTRAMUSCULAR | Status: AC
  Filled 2020-08-21: qty 5

## 2020-08-21 SURGICAL SUPPLY — 34 items
ALCOHOL 70% 16 OZ (MISCELLANEOUS) ×2 IMPLANT
BAG COUNTER SPONGE SURGICOUNT (BAG) ×2 IMPLANT
BLADE SURG 15 STRL LF DISP TIS (BLADE) ×1 IMPLANT
BLADE SURG 15 STRL SS (BLADE) ×1
COVER SURGICAL LIGHT HANDLE (MISCELLANEOUS) ×2 IMPLANT
GAUZE 4X4 16PLY ~~LOC~~+RFID DBL (SPONGE) ×4 IMPLANT
GAUZE PACKING FOLDED 2  STR (GAUZE/BANDAGES/DRESSINGS) ×1
GAUZE PACKING FOLDED 2 STR (GAUZE/BANDAGES/DRESSINGS) ×1 IMPLANT
GLOVE SURG ENC MOIS LTX SZ6.5 (GLOVE) ×2 IMPLANT
GLOVE SURG POLYISO LF SZ6 (GLOVE) ×2 IMPLANT
GOWN STRL REUS W/ TWL LRG LVL3 (GOWN DISPOSABLE) ×2 IMPLANT
GOWN STRL REUS W/TWL LRG LVL3 (GOWN DISPOSABLE) ×2
KIT BASIN OR (CUSTOM PROCEDURE TRAY) ×2 IMPLANT
KIT TURNOVER KIT B (KITS) ×2 IMPLANT
MANIFOLD NEPTUNE II (INSTRUMENTS) ×2 IMPLANT
NEEDLE BLUNT 16X1.5 OR ONLY (NEEDLE) ×2 IMPLANT
NEEDLE DENTAL 27 LONG (NEEDLE) ×4 IMPLANT
NS IRRIG 1000ML POUR BTL (IV SOLUTION) ×2 IMPLANT
PACK EENT II TURBAN DRAPE (CUSTOM PROCEDURE TRAY) ×2 IMPLANT
PAD ARMBOARD 7.5X6 YLW CONV (MISCELLANEOUS) ×2 IMPLANT
SPONGE SURGIFOAM ABS GEL 100 (HEMOSTASIS) ×2 IMPLANT
SPONGE SURGIFOAM ABS GEL 12-7 (HEMOSTASIS) IMPLANT
SPONGE SURGIFOAM ABS GEL SZ50 (HEMOSTASIS) IMPLANT
SUCTION FRAZIER HANDLE 10FR (MISCELLANEOUS) ×1
SUCTION TUBE FRAZIER 10FR DISP (MISCELLANEOUS) ×1 IMPLANT
SUT CHROMIC 3 0 PS 2 (SUTURE) ×2 IMPLANT
SUT CHROMIC 4 0 P 3 18 (SUTURE) IMPLANT
SYR 50ML SLIP (SYRINGE) ×2 IMPLANT
SYR BULB IRRIG 60ML STRL (SYRINGE) ×2 IMPLANT
TOWEL GREEN STERILE FF (TOWEL DISPOSABLE) ×2 IMPLANT
TUBE CONNECTING 12X1/4 (SUCTIONS) ×2 IMPLANT
WATER STERILE IRR 1000ML POUR (IV SOLUTION) ×2 IMPLANT
WATER TABLETS ICX (MISCELLANEOUS) ×2 IMPLANT
YANKAUER SUCT BULB TIP NO VENT (SUCTIONS) ×2 IMPLANT

## 2020-08-21 NOTE — Anesthesia Procedure Notes (Signed)
Procedure Name: Intubation Date/Time: 08/21/2020 7:39 AM Performed by: Barrington Ellison, CRNA Pre-anesthesia Checklist: Patient identified, Emergency Drugs available, Suction available and Patient being monitored Patient Re-evaluated:Patient Re-evaluated prior to induction Oxygen Delivery Method: Circle System Utilized Preoxygenation: Pre-oxygenation with 100% oxygen Induction Type: IV induction Ventilation: Mask ventilation without difficulty Laryngoscope Size: Glidescope and 4 Grade View: Grade I Nasal Tubes: Nasal Rae and Nasal prep performed Number of attempts: 1 Placement Confirmation: ETT inserted through vocal cords under direct vision, positive ETCO2 and breath sounds checked- equal and bilateral Secured at: 25 cm Tube secured with: Tape Dental Injury: Teeth and Oropharynx as per pre-operative assessment

## 2020-08-21 NOTE — Op Note (Signed)
Department of Dental Medicine    OPERATIVE REPORT  DATE OF SURGERY:   08/21/2020  PATIENT'S NAME:   Samuel [name] DATE OF BIRTH:   May 03, 1945 MEDICAL RECORD NUMBER: [id number]  SURGEON:   Kasean Denherder B. Benson [place], D.M.D.  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  MEDICATIONS: Ancef 2 g IV prior to invasive dental procedures. Local anesthesia with a total utilization of 3 cartridges of 34 mg of lidocaine with 0.018 mg of epinephrine/ea as well as 1 cartridge with 9 mg of bupivacaine with 0.009 mg of epinephrine/ea.  SPECIMENS:  13 teeth that were extracted and discarded  DRAINS/CULTURES:  None  COMPLICATIONS:  None  ESTIMATED BLOOD LOSS:  5 mL  INTRAVENOUS FLUIDS:  10 mL of Lactated ringers solution  INDICATIONS:  The patient was recently diagnosed with severe osteoporosis of the hip at high risk for fracture due to metastatic prostate cancer and is anticipating antiresorptive therapy.  A medically necessary dental consult was then requested to evaluate the patient for any dental/orofacial infection and their overall oral health.  The patient was examined and subsequently treatment planned for multiple extractions of retained root tips, chronically infected teeth and teeth with poor/hopeless  periodontal prognosis.  This treatment plan was made to decrease the perioperative and postoperative risks and complications associated with dental/orofacial infection from affecting the patient's systemic health.  OPERATIVE FINDINGS:  The patient was examined in operating room number 7.  The indicated teeth were identified and verified for extraction. The patient was noted be affected by severe dental decay, chronic apical periodontitis and periodontal disease.  DESCRIPTION OF PROCEDURE:  The patient was identified in the holding area and brought to the main operating room number 7 by the anesthesia team. The patient was then placed in the supine position on the operating table.  General anesthesia was then induced per the anesthesia team. The patient was then prepped and draped in the usual sterile fashion for dental medicine procedures.  A timeout was performed. The patient was identified and procedures were verified. A throat pack was placed at this time. The oral cavity was then thoroughly examined with the findings noted above. The patient was then ready for the dental medicine procedure as follows:   ANESTHESIA: Local anesthesia was administered sequentially with a total utilization of 3 cartridges each containing 34 mg of lidocaine with 0.018 mg of epinephrine as well as 1 cartridge  each containing 9 mg bupivacaine with 0.009 mg of epinephrine.  Location of anesthesia included upper left and right infiltration, nasopalatine block and palatal, lower right and lower left infiltration, mental and long buccal nerve blocks and lingual infiltration of teeth to be extracted.  ROUTINE EXTRACTIONS: The maxillary left and right quadrants were first approached. The teeth were then subluxated with a series of straight elevators.  Teeth numbers 3, 4, 6, 7,  8, 9, 10, 11, 14 and 15 were then removed with a 150 forceps without complications.  Alveoloplasty was then performed utilizing a ronguers and bone file.   The tissues were approximated and trimmed appropriately to help achieve primary closure.  The surgical sites were then curetted and irrigated with copious amounts of sterile saline (a significant amount of granulation tissue was removed from #3, #4, #6, #14 and #15 sockets).  Surgi Foam was placed in each extraction site.   The surgical sites were closed using 3-0 chromic gut sutures as follows: upper right: 1 figure-8, 3 simple interrupted; upper left: 2 simple interrupted.  The mandibular left and right quadrants were then approached. The teeth were subluxated with a series of straight elevators.  Teeth numbers 20 and 29 were then removed utilizing a 151 forceps without complications.  The tissues were approximated and trimmed appropriately to help achieve primary closure. The surgical sites were then irrigated with copious amounts of sterile saline.  Surgi Foam was placed in each extraction site.  SURGICAL EXTRACTIONS: Routine forceps extraction was attempted on tooth number 18 as described above.  Tooth number 18 broke off to the gingival margin.  A 15 blade incision was then made from the distal-buccal of #20 to the distal-buccal of #18.  A FTMP flap was then carefully reflected with the periosteal elevator.  A surgical handpiece was used with copious amounts of sterile irrigation to remove distal, mesial, lingual and interseptal bone.  Tooth number 18 was extracted in entirety (4 roots) using Molt curette and rongeurs without complications.  Alveoloplasty was performed utilizing a rongeurs and bone file.  The tissues were approximated and trimmed appropriately to help achieve primary closure.  The surgical sites were then irrigated with copious amounts of sterile saline.  Surgi Foam was placed in the extraction site.  The surgical site was closed using 3-0 chromic gut sutures as follows: 1 figure-8 style.   END OF PROCEDURE: Thorough oral irrigation with sterile saline was performed.  Good hemostasis  was observed.  The patient was examined for complications, and seeing none, the dental medicine procedure was deemed to be complete.  The throat pack was removed at this time. An oral airway was then placed at the request of the anesthesia team.  A series of 4x4 gauze were placed in the mouth to aid hemostasis as needed.  The patient was then handed over to the anesthesia team for final disposition.  After an appropriate amount of time, the patient was extubated and taken to the postanesthsia care unit in stable condition.  All counts were correct for the dental medicine procedure.     [place] Benson [place], D.M.D.

## 2020-08-21 NOTE — Interval H&P Note (Signed)
History and Physical Interval Note:  08/21/2020 7:10 AM  Samuel [name]  has presented today for surgery, with the diagnosis of DENTAL CARIES, PERIODONTAL DISEASE.  The various methods of treatment have been discussed with the patient and family. After consideration of risks, benefits and other options for treatment, the patient has consented to the Procedure(s): MULTIPLE EXTRACTION WITH ALVEOLOPLASTY (N/A) as a surgical intervention.  The patient's history has been reviewed, patient examined, no change in status, stable for surgery.  I have reviewed the patient's chart and labs.  Questions were answered to the patient's satisfaction.     Charlaine Dalton

## 2020-08-21 NOTE — Transfer of Care (Signed)
Immediate Anesthesia Transfer of Care Note  Patient: Samuel [name]  Procedure(s) Performed: MULTIPLE EXTRACTION WITH ALVEOLOPLASTY (Mouth)  Patient Location: PACU  Anesthesia Type:General  Level of Consciousness: awake and oriented  Airway & Oxygen Therapy: Patient Spontanous Breathing  Post-op Assessment: Report given to RN  Post vital signs: Reviewed and stable  Last Vitals:  Vitals Value Taken Time  BP 123/83 08/21/20 0941  Temp    Pulse 81 08/21/20 0941  Resp 19 08/21/20 0941  SpO2 97 % 08/21/20 0941  Vitals shown include unvalidated device data.  Last Pain:  Vitals:   08/21/20 0620  TempSrc:   PainSc: 0-No pain      Patients Stated Pain Goal: 2 (93/23/55 7322)  Complications: No notable events documented.

## 2020-08-21 NOTE — Discharge Instructions (Signed)
[facility] Department of Dental Medicine Jesiel Garate B. Mahli Glahn, D.M.D. Phone: [phone] Fax: [phone]    MOUTH CARE AFTER SURGERY   FACTS: Ice used in ice bag helps keep the swelling down, and can help lessen the pain. It is easier to treat pain BEFORE it happens. Spitting disturbs the clot and may cause bleeding to start again, or to get worse. Smoking delays healing and can cause complications. Sharing prescriptions can be dangerous.  Do not take medications not recently prescribed for you. Antibiotics may stop birth control pills from working.  Use other means of birth control while on antibiotics. Warm salt water rinses after the first 24 hours will help lessen the swelling:  Use 1/2 teaspoonful of table salt per oz.of water.  DO NOT: Do not spit.   Do not drink through a straw. Strongly advised not to smoke, dip snuff or chew tobacco at least for 3 days. Do not eat sharp or crunchy foods.  Avoid the area of surgery when chewing. Do not stop your antibiotics before your instructions say to do so. Do not eat hot foods until bleeding has stopped.  If you need to, let your food cool down to room temperature.  EXPECT: Some swelling, especially first 2-3 days. Soreness or discomfort in varying degrees.  Follow your dentist's instructions about how to handle pain before it starts. Pinkish saliva or light blood in saliva, or on your pillow in the morning.  This can last around 24 hours. Bruising inside or outside the mouth.  This may not show up until 2-3 days after surgery.  Don't worry, it will go away in time. Pieces of "bone" may work themselves loose.  It's OK.  If they bother you, let us know.    WHAT TO DO IMMEDIATELY AFTER SURGERY: Bite on gauze with steady pressure for 30-45 minutes at a time.  Switch out the gauze after 30-45 minutes for clean gauze, and continue this for 1-2 hours or until bleeding subsides. Do not chew on the gauze. Do not lie down flat.  Raise  your head support especially for the first 24 hours. Apply ice to your face on the side of the surgery.  You may apply it 20 minutes on and a few minutes off.  Ice for 8-12 hours.  You may use ice up to 24 hours. Before the numbness wears off, take a pain pill as instructed. Prescription pain medication is not always required.  SWELLING: Expect swelling for the first couple of days.  It should get better after that. If swelling increases 3 days or so after surgery, let us know as soon as possible.  FEVER: Take Tylenol every 4 hours if needed to lower your temperature, especially if it is at 100F or higher. Drink lots of fluids. If the fever does not go away, let us know.  BREATHING TROUBLE: Any unusual difficulty breathing means you have to have someone bring you to the emergency room ASAP.  BLEEDING: Light oozing is expected for 24 hours or so. Prop head up with pillows. Do not spit. Do not confuse bright red fresh flowing blood with lots of saliva colored with a little bit of blood. If you notice some bleeding, place gauze or a tea bag where it is bleeding and apply CONSTANT pressure by biting down for 1 hour.  Avoid talking during this time.  Do not remove the gauze or tea bag during this hour to "check" the bleeding. If you notice bright RED bleeding FLOWING out   of particular area, and filling the floor of your mouth, put a wad of gauze on that area, bite down firmly and constantly.  Call us immediately.  If we're closed, have someone bring you to the emergency room.  ORAL HYGIENE: Brush your teeth as usual after meals and before bedtime. Use a soft toothbrush around the area of surgery. DO NOT AVOID BRUSHING.  Otherwise bacteria(germs) will grow and may delay healing or encourage infection. Since you cannot spit, just gently rinse and let the water flow out of your mouth. DO NOT SWISH HARD.  EATING: Cool liquids are a good point to start.  Increase to soft foods as  tolerated.   PRESCRIPTIONS: Follow the directions for your prescriptions exactly as written. If your doctor gave you a narcotic pain medication, do not drive, operate machinery or drink alcohol when on that medication.   QUESTIONS? Call our office during office hours [phone] or call the Emergency Room at [phone].  

## 2020-08-21 NOTE — Anesthesia Postprocedure Evaluation (Signed)
Anesthesia Post Note  Patient: Samuel [name]  Procedure(s) Performed: MULTIPLE EXTRACTION WITH ALVEOLOPLASTY (Mouth)     Patient location during evaluation: PACU Anesthesia Type: General Level of consciousness: awake and alert Pain management: pain level controlled Vital Signs Assessment: post-procedure vital signs reviewed and stable Respiratory status: spontaneous breathing, nonlabored ventilation, respiratory function stable and patient connected to nasal cannula oxygen Cardiovascular status: blood pressure returned to baseline and stable Postop Assessment: no apparent nausea or vomiting Anesthetic complications: no   No notable events documented.  Last Vitals:  Vitals:   08/21/20 0956 08/21/20 1011  BP: 134/68 134/74  Pulse: 74 71  Resp: 13 17  Temp:  36.8 C  SpO2: 99% 98%    Last Pain:  Vitals:   08/21/20 1011  TempSrc:   PainSc: 0-No pain                 Kellie Chisolm L Quida Glasser

## 2020-08-22 ENCOUNTER — Telehealth: Payer: Self-pay

## 2020-08-22 ENCOUNTER — Other Ambulatory Visit: Payer: Self-pay | Admitting: Oncology

## 2020-08-22 ENCOUNTER — Encounter (HOSPITAL_COMMUNITY): Payer: Self-pay | Admitting: Dentistry

## 2020-08-22 ENCOUNTER — Other Ambulatory Visit (HOSPITAL_COMMUNITY): Payer: Self-pay

## 2020-08-22 MED ORDER — XTANDI 40 MG PO CAPS
ORAL_CAPSULE | ORAL | 1 refills | Status: DC
  Filled 2020-08-22 – 2020-08-27 (×2): qty 120, 30d supply, fill #0
  Filled 2020-09-23: qty 120, 30d supply, fill #1

## 2020-08-22 NOTE — Telephone Encounter (Signed)
Oral Oncology Patient Advocate Encounter  Met patient in lobby room to complete application for [place] in an effort to reduce patient's out of pocket expense for [employer] to $0.    Application completed and faxed to [phone].   [employer] patient assistance phone number for follow up is [phone].   This encounter will be updated until final determination.    [place] Patient [place] Phone [phone] Fax [phone] 08/22/2020 2:42 PM

## 2020-08-25 ENCOUNTER — Encounter: Payer: Self-pay | Admitting: Oncology

## 2020-08-25 DIAGNOSIS — K029 Dental caries, unspecified: Secondary | ICD-10-CM

## 2020-08-25 DIAGNOSIS — K056 Periodontal disease, unspecified: Secondary | ICD-10-CM

## 2020-08-26 LAB — GUARDANT 360

## 2020-08-27 ENCOUNTER — Other Ambulatory Visit (HOSPITAL_COMMUNITY): Payer: Self-pay

## 2020-09-01 ENCOUNTER — Other Ambulatory Visit (HOSPITAL_COMMUNITY): Payer: Self-pay

## 2020-09-01 ENCOUNTER — Telehealth: Payer: Self-pay

## 2020-09-01 NOTE — Telephone Encounter (Signed)
Oral Oncology Patient Advocate Encounter  Was successful in securing patient a $8000 grant from [employer] to provide copayment coverage for [place].  This will keep the out of pocket expense at $0.     [employer] ID: [id number]  I have spoken with the patient.   The billing information is as follows and has been shared with [place]: [id number] PCN: [id number] Member ID: [id number] Group ID: [id number] Dates of Eligibility: 08/02/20 through 08/01/21  Fund:  [place] Patient Protection Phone [phone] Fax [phone] 09/01/2020 10:00 AM

## 2020-09-04 ENCOUNTER — Encounter (HOSPITAL_COMMUNITY): Payer: Self-pay | Admitting: Dentistry

## 2020-09-04 ENCOUNTER — Other Ambulatory Visit: Payer: Self-pay

## 2020-09-04 ENCOUNTER — Ambulatory Visit (INDEPENDENT_AMBULATORY_CARE_PROVIDER_SITE_OTHER): Payer: Medicare HMO | Admitting: Dentistry

## 2020-09-04 DIAGNOSIS — K08199 Complete loss of teeth due to other specified cause, unspecified class: Secondary | ICD-10-CM

## 2020-09-04 NOTE — Progress Notes (Signed)
Department of Dental Medicine      POSTOPERATIVE VISIT  Service Date:   09/04/2020  Patient Name:   Samuel [name] Date of Birth:   12-26-1945 Medical Record Number: [id number]        TODAY'S VISIT:   Assessment:   The patient continues to heal well and consistent with dental procedures performed.   Plan:  Follow-up again in 3-4 weeks prior to clearing for Prolia treatment. Recommendations: Establish care at an outside dental office for routine treatment including replacement of missing teeth as needed, cleanings/periodontal therapy and exams.  Discussed in detail all treatment options and recommendations with the patient and they are agreeable to the plan.       PROGRESS NOTE:   COVID-19 SCREENING:  The patient denies symptoms concerning for COVID-19 infection including fever, chills, cough, or newly developed shortness of breath.   HISTORY OF PRESENT ILLNESS Samuel [name] presents today for a postoperative visit s/p multiple extractions in the operating room on 08/21/20.   Medical and dental history reviewed with the patient.  No changes reported.   CHIEF COMPLAINT:   Patient with no complaints.  He reports that he has been doing well since the extractions with only one area that is a little sore still in the upper right quadrant.   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   O2 DESAT  10/26/13  IN ED , no sleep study ever done per pt- no cpap   Past Surgical History:  Procedure Laterality Date   COLONOSCOPY     EYE SURGERY     LEFT AYE AS CHILD    JOINT REPLACEMENT Bilateral    LT KNEE 3 YRS AGO    MULTIPLE EXTRACTIONS WITH ALVEOLOPLASTY N/A 08/21/2020   Procedure: MULTIPLE EXTRACTION WITH ALVEOLOPLASTY;  Surgeon: Charlaine Dalton, DMD;  Location: [facility];  Service: Dentistry;  Laterality: N/A;   TOTAL KNEE ARTHROPLASTY Right 12/04/2013   dr Rhona Raider   TOTAL KNEE ARTHROPLASTY Right 12/04/2013   Procedure: TOTAL KNEE ARTHROPLASTY;  Surgeon: Hessie Dibble, MD;  Location: [facility];  Service: Orthopedics;  Laterality: Right;   Current Outpatient Medications  Medication Sig Dispense Refill   aspirin EC 325 MG tablet Take 325 mg by mouth at bedtime.     atorvastatin (LIPITOR) 20 MG tablet Take 1 tablet (20 mg total) by mouth every morning. 90 tablet 1   enzalutamide (XTANDI) 40 MG capsule TAKE 4 CAPSULES BY MOUTH ONCE DAILY. 120 capsule 1   triptorelin (TRELESTAR LA) 11.25 MG injection Inject 11.25 mg into the muscle every 6 (six) months.     XTANDI 40 MG capsule TAKE 4 CAPSULES  BY MOUTH ONCE DAILY. (Patient not taking: No sig reported) 120 capsule 1   No current facility-administered medications for this visit.   Allergies  Allergen Reactions   Other Other (See Comments)    Patient stated,"if I touch felt (material) I get "knots" on my body."   Peach [Prunus Persica] Other (See Comments)    Patient stated,"if I touch or eat peach fuzz, I get "knots" all over my face and mouth."     LABS: Lab Results  Component Value Date   WBC 6.3 08/05/2020   HGB 14.5 08/05/2020   HCT 42.4 08/05/2020   MCV 91.0 08/05/2020   PLT 246 08/05/2020   BMET    Component Value Date/Time   NA 136 08/05/2020 1143   NA 137 12/15/2016 1421   K 4.1 08/05/2020 1143    K 4.4 12/15/2016 1421   CL 105 08/05/2020 1143   CO2 23 08/05/2020 1143   CO2 19 (L) 12/15/2016 1421   GLUCOSE 161 (H) 08/05/2020 1143   GLUCOSE 104 12/15/2016 1421   BUN 16 08/05/2020 1143   BUN 13.4 12/15/2016 1421   CREATININE 0.86 08/05/2020 1143   CREATININE 0.75 04/28/2020 0000   CREATININE 0.8 12/15/2016 1421   CALCIUM 9.6 08/05/2020 1143   CALCIUM 9.4 12/15/2016 1421   GFRNONAA >60 08/05/2020 1143   GFRNONAA 89 04/28/2020 0000   GFRAA 103 04/28/2020 0000    Lab Results  Component Value Date   INR 0.99 11/23/2013   No results found for: PTT   VITALS: BP (!) 157/78 (BP Location: Right Arm, Patient Position: Sitting, Cuff Size: Normal)   Pulse 67   Temp 98.2 F (36.8 C) (Oral)    EXAM: Extraction sites appear to be healing WNL.  No signs of wound dehiscence or infection evident upon examination.  No sutures remain in-tact. Small area of exposed bone in the upper right quadrant that is taking a little longer to heal, but is healing WNL.  Will monitor and reevaluate at next follow-up visit for full tissue coverage.   ASSESSMENT:   Postoperative course is consistent with dental procedures performed.   PLAN AND RECOMMENDATIONS: Return in 3-4 weeks for another postoperative visit prior to giving clearance to start Prolia.     Establish care at an outside dental office for routine dental care including replacement of missing teeth as needed, cleanings/periodontal therapy and exams.   Call if any questions or concerns arise.  All questions and concerns were invited and addressed.  The patient tolerated today's visit well and departed in stable condition.  Dennison Benson Norway, D.M.D.

## 2020-09-05 ENCOUNTER — Ambulatory Visit (HOSPITAL_COMMUNITY): Payer: Medicare HMO | Admitting: Dentistry

## 2020-09-09 ENCOUNTER — Other Ambulatory Visit (HOSPITAL_COMMUNITY): Payer: Self-pay

## 2020-09-23 ENCOUNTER — Other Ambulatory Visit (HOSPITAL_COMMUNITY): Payer: Self-pay

## 2020-09-24 HISTORY — PX: MOUTH SURGERY: SHX715

## 2020-09-26 ENCOUNTER — Ambulatory Visit (INDEPENDENT_AMBULATORY_CARE_PROVIDER_SITE_OTHER): Payer: Medicare HMO | Admitting: Dentistry

## 2020-09-26 ENCOUNTER — Other Ambulatory Visit: Payer: Self-pay

## 2020-09-26 ENCOUNTER — Encounter (HOSPITAL_COMMUNITY): Payer: Self-pay | Admitting: Dentistry

## 2020-09-26 DIAGNOSIS — K08199 Complete loss of teeth due to other specified cause, unspecified class: Secondary | ICD-10-CM

## 2020-09-26 NOTE — Patient Instructions (Signed)
[place] Benson Norway, D.M.D. Phone: [phone] Fax: [phone]   It was a pleasure seeing you today!  Please refer to the information below regarding your dental visit with [place].   Thank you for letting us provide care for you.  If there is anything we can do for you, please let us know.    [facility]-  They have many different locations and good options for different types of dentures.  You do not need to call and make an appointment for your first visit.  You can go to one of their offices during working hours and be seen for a consultation.   QUESTIONS? Call our office during office hours at [phone].

## 2020-09-26 NOTE — Progress Notes (Signed)
Department of Dental Medicine      POSTOPERATIVE VISIT  Service Date:   09/26/2020  Patient Name:   Samuel [name] Date of Birth:   March 10, 1945 Medical Record Number: [id number]        TODAY'S VISIT:   Assessment:   The patient continues to heal well and consistent with dental procedures performed.   Plan:  Follow-up as needed.   The patient is optimized from a dental standpoint at this time and has had adequate time to heal s/p extractions to begin Prolia therapy.  No dental concerns or contraindications. Recommendations: Establish care at an outside dental office for routine treatment including replacement of missing teeth as needed, cleanings/periodontal therapy and exams.  Discussed in detail all treatment options and recommendations with the patient and they are agreeable to the plan.       PROGRESS NOTE:   COVID-19 SCREENING:  The patient denies symptoms concerning for COVID-19 infection including fever, chills, cough, or newly developed shortness of breath.   HISTORY OF PRESENT ILLNESS Samuel [name] presents today for a 2nd postoperative visit s/p multiple extractions in the operating room on 08/21/20 to evaluate healing prior to giving dental clearance for Prolia therapy.   Medical and dental history reviewed with the patient.  No changes reported.   CHIEF COMPLAINT:   Patient with no complaints. Here for a routine dental appointment.   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   O2 DESAT  10/26/13  IN ED , no sleep study ever done per pt- no cpap   Past Surgical History:  Procedure Laterality Date   COLONOSCOPY     EYE SURGERY     LEFT AYE AS CHILD    JOINT REPLACEMENT Bilateral    LT KNEE 3 YRS AGO    MULTIPLE EXTRACTIONS WITH ALVEOLOPLASTY N/A 08/21/2020   Procedure: MULTIPLE EXTRACTION WITH ALVEOLOPLASTY;  Surgeon: Charlaine Dalton, DMD;  Location: [facility];  Service: Dentistry;  Laterality: N/A;   TOTAL KNEE ARTHROPLASTY Right 12/04/2013   dr Rhona Raider   TOTAL KNEE ARTHROPLASTY Right 12/04/2013   Procedure: TOTAL KNEE ARTHROPLASTY;  Surgeon: Hessie Dibble, MD;  Location: [facility];  Service: Orthopedics;  Laterality: Right;   Current Outpatient Medications  Medication Sig Dispense Refill   aspirin EC 325 MG tablet Take 325 mg by mouth at bedtime.     atorvastatin (LIPITOR) 20 MG tablet Take 1 tablet (20 mg total) by mouth every morning. 90 tablet 1   enzalutamide (XTANDI) 40 MG capsule TAKE 4 CAPSULES BY MOUTH ONCE DAILY. 120 capsule 1   triptorelin (TRELESTAR LA) 11.25 MG injection Inject 11.25 mg into the muscle  every 6 (six) months.     XTANDI 40 MG capsule TAKE 4 CAPSULES BY MOUTH ONCE DAILY. (Patient not taking: No sig reported) 120 capsule 1   No current facility-administered medications for this visit.   Allergies  Allergen Reactions   Other Other (See Comments)    Patient stated,"if I touch felt (material) I get "knots" on my body."   Peach [Prunus Persica] Other (See Comments)    Patient stated,"if I touch or eat peach fuzz, I get "knots" all over my face and mouth."     LABS: Lab Results  Component Value Date   WBC 6.3 08/05/2020   HGB 14.5 08/05/2020   HCT 42.4 08/05/2020   MCV 91.0 08/05/2020   PLT 246 08/05/2020    BMET    Component Value Date/Time   NA 136 08/05/2020 1143   NA 137 12/15/2016 1421   K 4.1 08/05/2020 1143   K 4.4 12/15/2016 1421   CL 105 08/05/2020 1143   CO2 23 08/05/2020 1143   CO2 19 (L) 12/15/2016 1421   GLUCOSE 161 (H) 08/05/2020 1143   GLUCOSE 104 12/15/2016 1421   BUN 16 08/05/2020 1143   BUN 13.4 12/15/2016 1421   CREATININE 0.86 08/05/2020 1143   CREATININE 0.75 04/28/2020 0000   CREATININE 0.8 12/15/2016 1421   CALCIUM 9.6 08/05/2020 1143   CALCIUM 9.4 12/15/2016 1421   GFRNONAA >60 08/05/2020 1143   GFRNONAA 89 04/28/2020 0000   GFRAA 103 04/28/2020 0000    Lab Results  Component Value Date   INR 0.99 11/23/2013   No results found for: PTT   VITALS: BP (!) 153/80 (BP Location: Right Arm, Patient Position: Sitting, Cuff Size: Large)   Pulse 88   Temp 98.2 F (36.8 C) (Oral)    EXAM: Extraction sites appear to be healing WNL.  No signs of wound dehiscence or infection evident upon examination.  No sutures remain in-tact.   ASSESSMENT:   Postoperative course is consistent with dental procedures performed.   PLAN AND RECOMMENDATIONS: Follow-up as needed. Establish care at an outside dental office for routine dental care including replacement of missing teeth as needed, cleanings/periodontal therapy and exams.   Recommend that the patient discuss plans to return to the dentist for non-urgent treatment with their medical team to ensure they are medically optimized and there are no contraindications. Call if any questions or concerns arise.  All questions and concerns were invited and addressed.  The patient tolerated today's visit well and departed in stable condition.  Gallitzin Benson [place], D.M.D.

## 2020-09-29 ENCOUNTER — Other Ambulatory Visit (HOSPITAL_COMMUNITY): Payer: Self-pay

## 2020-10-01 DIAGNOSIS — C61 Malignant neoplasm of prostate: Secondary | ICD-10-CM | POA: Diagnosis not present

## 2020-10-01 DIAGNOSIS — M8580 Other specified disorders of bone density and structure, unspecified site: Secondary | ICD-10-CM | POA: Diagnosis not present

## 2020-10-22 ENCOUNTER — Other Ambulatory Visit (HOSPITAL_COMMUNITY): Payer: Self-pay

## 2020-10-22 ENCOUNTER — Other Ambulatory Visit: Payer: Self-pay | Admitting: Oncology

## 2020-10-22 MED ORDER — XTANDI 40 MG PO CAPS
ORAL_CAPSULE | ORAL | 1 refills | Status: DC
  Filled 2020-10-22: qty 120, 30d supply, fill #0
  Filled 2020-11-25: qty 120, 30d supply, fill #1

## 2020-10-27 ENCOUNTER — Other Ambulatory Visit (HOSPITAL_COMMUNITY): Payer: Self-pay

## 2020-10-31 ENCOUNTER — Telehealth: Payer: Self-pay

## 2020-10-31 ENCOUNTER — Encounter: Payer: Self-pay | Admitting: Nurse Practitioner

## 2020-10-31 ENCOUNTER — Ambulatory Visit (INDEPENDENT_AMBULATORY_CARE_PROVIDER_SITE_OTHER): Payer: Medicare HMO | Admitting: Nurse Practitioner

## 2020-10-31 ENCOUNTER — Other Ambulatory Visit: Payer: Self-pay

## 2020-10-31 DIAGNOSIS — Z0000 Encounter for general adult medical examination without abnormal findings: Secondary | ICD-10-CM | POA: Diagnosis not present

## 2020-10-31 NOTE — Progress Notes (Signed)
   This service is provided via telemedicine  No vital signs collected/recorded due to the encounter was a telemedicine visit.   Location of patient (ex: home, work):  Home  Patient consents to a telephone visit: Yes, see telephone visit dated 10/31/20  Location of the provider (ex: office, home):  [facility], Office   Name of any referring provider:  N/A  Names of all persons participating in the telemedicine service and their role in the encounter:  S.Chrae B/CMA, Sherrie Mustache, NP, Metta Clines (lady friend) and Patient   Time spent on call:  9 min with medical assistant

## 2020-10-31 NOTE — Patient Instructions (Signed)
Samuel [name] , Thank you for taking time to come for your Medicare Wellness Visit. I appreciate your ongoing commitment to your health goals. Please review the following plan we discussed and let me know if I can assist you in the future.   Screening recommendations/referrals: Colonoscopy aged out Recommended yearly ophthalmology/optometry visit for glaucoma screening and checkup Recommended yearly dental visit for hygiene and checkup  Vaccinations: Influenza vaccine you have declined Pneumococcal vaccine you have declined Tdap vaccine RECOMMENDED- to get at local pharmacy  Shingles vaccine na    Advanced directives: recommend to complete and bring back to the office to place on file.   Conditions/risks identified: advance age, obesity  Next appointment: 1 year for AWV  Preventive Care 61 Years and Older, Male Preventive care refers to lifestyle choices and visits with your health care provider that can promote health and wellness. What does preventive care include? A yearly physical exam. This is also called an annual well check. Dental exams once or twice a year. Routine eye exams. Ask your health care provider how often you should have your eyes checked. Personal lifestyle choices, including: Daily care of your teeth and gums. Regular physical activity. Eating a healthy diet. Avoiding tobacco and drug use. Limiting alcohol use. Practicing safe sex. Taking low doses of aspirin every day. Taking vitamin and mineral supplements as recommended by your health care provider. What happens during an annual well check? The services and screenings done by your health care provider during your annual well check will depend on your age, overall health, lifestyle risk factors, and family history of disease. Counseling  Your health care provider may ask you questions about your: Alcohol use. Tobacco use. Drug use. Emotional well-being. Home and relationship well-being. Sexual  activity. Eating habits. History of falls. Memory and ability to understand (cognition). Work and work Statistician. Screening  You may have the following tests or measurements: Height, weight, and BMI. Blood pressure. Lipid and cholesterol levels. These may be checked every 5 years, or more frequently if you are over 27 years old. Skin check. Lung cancer screening. You may have this screening every year starting at age 79 if you have a 30-pack-year history of smoking and currently smoke or have quit within the past 15 years. Fecal occult blood test (FOBT) of the stool. You may have this test every year starting at age 19. Flexible sigmoidoscopy or colonoscopy. You may have a sigmoidoscopy every 5 years or a colonoscopy every 10 years starting at age 68. Prostate cancer screening. Recommendations will vary depending on your family history and other risks. Hepatitis C blood test. Hepatitis B blood test. Sexually transmitted disease (STD) testing. Diabetes screening. This is done by checking your blood sugar (glucose) after you have not eaten for a while (fasting). You may have this done every 1-3 years. Abdominal aortic aneurysm (AAA) screening. You may need this if you are a current or former smoker. Osteoporosis. You may be screened starting at age 64 if you are at high risk. Talk with your health care provider about your test results, treatment options, and if necessary, the need for more tests. Vaccines  Your health care provider may recommend certain vaccines, such as: Influenza vaccine. This is recommended every year. Tetanus, diphtheria, and acellular pertussis (Tdap, Td) vaccine. You may need a Td booster every 10 years. Zoster vaccine. You may need this after age 1. Pneumococcal 13-valent conjugate (PCV13) vaccine. One dose is recommended after age 56. Pneumococcal polysaccharide (PPSV23) vaccine. One dose  is recommended after age 103. Talk to your health care provider about which  screenings and vaccines you need and how often you need them. This information is not intended to replace advice given to you by your health care provider. Make sure you discuss any questions you have with your health care provider. Document Released: 02/28/2015 Document Revised: 10/22/2015 Document Reviewed: 12/03/2014 Elsevier Interactive Patient Education  2017 [place] Prevention in the Home Falls can cause injuries. They can happen to people of all ages. There are many things you can do to make your home safe and to help prevent falls. What can I do on the outside of my home? Regularly fix the edges of walkways and driveways and fix any cracks. Remove anything that might make you trip as you walk through a door, such as a raised step or threshold. Trim any bushes or trees on the path to your home. Use bright outdoor lighting. Clear any walking paths of anything that might make someone trip, such as rocks or tools. Regularly check to see if handrails are loose or broken. Make sure that both sides of any steps have handrails. Any raised decks and porches should have guardrails on the edges. Have any leaves, snow, or ice cleared regularly. Use sand or salt on walking paths during winter. Clean up any spills in your garage right away. This includes oil or grease spills. What can I do in the bathroom? Use night lights. Install grab bars by the toilet and in the tub and shower. Do not use towel bars as grab bars. Use non-skid mats or decals in the tub or shower. If you need to sit down in the shower, use a plastic, non-slip stool. Keep the floor dry. Clean up any water that spills on the floor as soon as it happens. Remove soap buildup in the tub or shower regularly. Attach bath mats securely with double-sided non-slip rug tape. Do not have throw rugs and other things on the floor that can make you trip. What can I do in the bedroom? Use night lights. Make sure that you have a  light by your bed that is easy to reach. Do not use any sheets or blankets that are too big for your bed. They should not hang down onto the floor. Have a firm chair that has side arms. You can use this for support while you get dressed. Do not have throw rugs and other things on the floor that can make you trip. What can I do in the kitchen? Clean up any spills right away. Avoid walking on wet floors. Keep items that you use a lot in easy-to-reach places. If you need to reach something above you, use a strong step stool that has a grab bar. Keep electrical cords out of the way. Do not use floor polish or wax that makes floors slippery. If you must use wax, use non-skid floor wax. Do not have throw rugs and other things on the floor that can make you trip. What can I do with my stairs? Do not leave any items on the stairs. Make sure that there are handrails on both sides of the stairs and use them. Fix handrails that are broken or loose. Make sure that handrails are as long as the stairways. Check any carpeting to make sure that it is firmly attached to the stairs. Fix any carpet that is loose or worn. Avoid having throw rugs at the top or bottom of the stairs.  If you do have throw rugs, attach them to the floor with carpet tape. Make sure that you have a light switch at the top of the stairs and the bottom of the stairs. If you do not have them, ask someone to add them for you. What else can I do to help prevent falls? Wear shoes that: Do not have high heels. Have rubber bottoms. Are comfortable and fit you well. Are closed at the toe. Do not wear sandals. If you use a stepladder: Make sure that it is fully opened. Do not climb a closed stepladder. Make sure that both sides of the stepladder are locked into place. Ask someone to hold it for you, if possible. Clearly mark and make sure that you can see: Any grab bars or handrails. First and last steps. Where the edge of each step  is. Use tools that help you move around (mobility aids) if they are needed. These include: Canes. Walkers. Scooters. Crutches. Turn on the lights when you go into a dark area. Replace any light bulbs as soon as they burn out. Set up your furniture so you have a clear path. Avoid moving your furniture around. If any of your floors are uneven, fix them. If there are any pets around you, be aware of where they are. Review your medicines with your doctor. Some medicines can make you feel dizzy. This can increase your chance of falling. Ask your doctor what other things that you can do to help prevent falls. This information is not intended to replace advice given to you by your health care provider. Make sure you discuss any questions you have with your health care provider. Document Released: 11/28/2008 Document Revised: 07/10/2015 Document Reviewed: 03/08/2014 Elsevier Interactive Patient Education  2017 [name].

## 2020-10-31 NOTE — Telephone Encounter (Signed)
[name], Samuel [name] are scheduled for a virtual visit with your provider today.    Just as we do with appointments in the office, we must obtain your consent to participate.  Your consent will be active for this visit and any virtual visit you may have with one of our providers in the next 365 days.    If you have a MyChart account, I can also send a copy of this consent to you electronically.  All virtual visits are billed to your insurance company just like a traditional visit in the office.  As this is a virtual visit, video technology does not allow for your provider to perform a traditional examination.  This may limit your provider's ability to fully assess your condition.  If your provider identifies any concerns that need to be evaluated in person or the need to arrange testing such as labs, EKG, etc, we will make arrangements to do so.    Although advances in technology are sophisticated, we cannot ensure that it will always work on either your end or our end.  If the connection with a video visit is poor, we may have to switch to a telephone visit.  With either a video or telephone visit, we are not always able to ensure that we have a secure connection.   I need to obtain your verbal consent now.   Are you willing to proceed with your visit today?   Samuel [name] has provided verbal consent on 10/31/2020 for a virtual visit (video or telephone).   Leigh Aurora [place], [place] 10/31/2020  8:20 AM

## 2020-10-31 NOTE — Progress Notes (Signed)
Subjective:   Zyan Gigante is a 75 y.o. male who presents for Medicare Annual/Subsequent preventive examination.  Review of Systems     Cardiac Risk Factors include: dyslipidemia;male gender;diabetes mellitus;advanced age (>79mn, >>40women);obesity (BMI >30kg/m2);sedentary lifestyle     Objective:    There were no vitals filed for this visit. There is no height or weight on file to calculate BMI.  Advanced Directives 10/31/2020 08/21/2020 05/02/2020 10/30/2019 01/10/2019 04/10/2018 10/14/2017  Does Patient Have a Medical Advance Directive? No No No No No No No  Would patient like information on creating a medical advance directive? Yes (MAU/Ambulatory/Procedural Areas - Information given) No - Patient declined No - Patient declined - Yes (MAU/Ambulatory/Procedural Areas - Information given) No - Patient declined Yes (MAU/Ambulatory/Procedural Areas - Information given)    Current Medications (verified) Outpatient Encounter Medications as of 10/31/2020  Medication Sig   aspirin EC 325 MG tablet Take 325 mg by mouth at bedtime.   atorvastatin (LIPITOR) 20 MG tablet Take 1 tablet (20 mg total) by mouth every morning.   enzalutamide (XTANDI) 40 MG capsule TAKE 4 CAPSULES BY MOUTH ONCE DAILY.   triptorelin (TRELESTAR LA) 11.25 MG injection Inject 11.25 mg into the muscle every 6 (six) months.   [DISCONTINUED] XTANDI 40 MG capsule TAKE 4 CAPSULES BY MOUTH ONCE DAILY. (Patient not taking: No sig reported)   No facility-administered encounter medications on file as of 10/31/2020.    Allergies (verified) Other and Peach [prunus persica]   History: Past Medical History:  Diagnosis Date   Arthritis    Blind left eye    Cyst in hand 10/2013   left hand, treated with antibiotic & pain medicine   Diabetes mellitus without complication (HCC)    A1234567.1% 04/28/20   Heart murmur    Hepatitis A    25 YRS AGO   Hyperlipidemia    Pre-diabetes    Prostate cancer ([facility]    Sleep apnea    ????    O2 DESAT  10/26/13  IN ED , no sleep study ever done per pt- no cpap   Past Surgical History:  Procedure Laterality Date   COLONOSCOPY     EYE SURGERY     LEFT AYE AS CHILD    JOINT REPLACEMENT Bilateral    LT KNEE 3 YRS AGO    MOUTH SURGERY  09/24/2020   MULTIPLE EXTRACTIONS WITH ALVEOLOPLASTY N/A 08/21/2020   Procedure: MULTIPLE EXTRACTION WITH ALVEOLOPLASTY;  Surgeon: OCharlaine Dalton DMD;  [facility]  Service: Dentistry;  Laterality: N/A;   TOTAL KNEE ARTHROPLASTY Right 12/04/2013   dr dRhona Raider  TOTAL KNEE ARTHROPLASTY Right 12/04/2013   Procedure: TOTAL KNEE ARTHROPLASTY;  Surgeon: PHessie Dibble MD;  [facility]  Service: Orthopedics;  Laterality: Right;   Family History  Problem Relation Age of Onset   Stroke Mother    Heart attack Mother    Diabetes Mother    Stroke Father    Stomach cancer Brother    Prostate cancer Brother    Throat cancer Sister    Colon cancer Neg Hx    Esophageal cancer Neg Hx    Rectal cancer Neg Hx    Social History   Socioeconomic History   Marital status: Widowed    Spouse name: Not on file   Number of children: Not on file   Years of education: Not on file   Highest education level: Not on file  Occupational History   Not on file  Tobacco Use   Smoking status: Former    Packs/day: 0.50    Years: 5.00    Pack years: 2.50    Types: Cigarettes    Quit date: 02/16/1983    Years since quitting: 37.7   Smokeless tobacco: Never  Vaping Use   Vaping Use: Never used  Substance and Sexual Activity   Alcohol use: No    Alcohol/week: 0.0 standard drinks   Drug use: No   Sexual activity: Not on file  Other Topics Concern   Not on file  Social History Narrative   DIET: Fruit, veggies, beef, pork, chicken, fish      DO YOU DRINK/EAT THINGS WITH CAFFEINE: yes      MARITAL STATUS: widowed      WHAT YEAR WERE YOU MARRIED:1965      DO YOU LIVE IN A HOUSE, APARTMENT, ASSISTED LIVING, CONDO TRAILER ETC.: house      IS IT  ONE OR MORE STORIES: 1      HOW MANY PERSONS LIVE IN YOUR HOME: 2      DO YOU HAVE PETS IN YOUR HOME: no      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      DO YOU EXERCISE: yes      WHAT TYPE AND HOW OFTEN: walking 5 days a week   Social Determinants of Radio broadcast assistant Strain: Not on file  Food Insecurity: Not on file  Transportation Needs: Not on file  Physical Activity: Not on file  Stress: Not on file  Social Connections: Not on file    Tobacco Counseling Counseling given: Not Answered   Clinical Intake:  Pre-visit preparation completed: Yes  Pain : No/denies pain     BMI - recorded: 38 Nutritional Risks: None Diabetes: Yes  How often do you need to have someone help you when you read instructions, pamphlets, or other written materials from your doctor or pharmacy?: 1 - Never  Diabetic?yes         Activities of Daily Living In your present state of health, do you have any difficulty performing the following activities: 10/31/2020 10/31/2020  Hearing? N -  Vision? N N  Difficulty concentrating or making decisions? N N  Walking or climbing stairs? N N  Dressing or bathing? N N  Doing errands, shopping? N N  Preparing Food and eating ? - N  Using the Toilet? - N  In the past six months, have you accidently leaked urine? - N  Do you have problems with loss of bowel control? - N  Managing your Medications? - N  Managing your Finances? - N  Housekeeping or managing your Housekeeping? - N  Some recent data might be hidden    Patient Care Team: [name], NP as PCP - General (Geriatric Medicine) Festus Aloe, MD as Consulting Physician (Urology)  Indicate any recent Medical Services you may have received from other than [facility] providers in the past year (date may be approximate).     Assessment:   This is a routine wellness examination for Sandra.  Hearing/Vision screen Hearing Screening - Comments:: No hearing  issues  Vision Screening - Comments:: No eye exam within the last year. Patient states &quot; I'm pretty good, do not need an eye doctor&quot;  Dietary issues and exercise activities discussed: Current Exercise Habits: Home exercise routine, Type of exercise: walking, Time (Minutes): 15, Frequency (Times/Week): 5, Weekly Exercise (Minutes/Week): 75   Goals Addressed   None  Depression Screen PHQ 2/9 Scores 10/31/2020 10/30/2019 01/10/2019 10/16/2018 10/14/2017 05/11/2017 10/13/2016  PHQ - 2 Score 0 0 0 0 0 0 0    Fall Risk Fall Risk  10/31/2020 10/30/2019 01/10/2019 10/16/2018 04/10/2018  Falls in the past year? 0 0 0 0 0  Number falls in past yr: 0 0 0 0 0  Injury with Fall? 0 0 0 0 0  Risk for fall due to : No Fall Risks - - - -  Follow up Falls evaluation completed - - - -    FALL RISK PREVENTION PERTAINING TO THE HOME:  Any stairs in or around the home? No  If so, are there any without handrails? No  Home free of loose throw rugs in walkways, pet beds, electrical cords, etc? Yes  Adequate lighting in your home to reduce risk of falls? Yes   ASSISTIVE DEVICES UTILIZED TO PREVENT FALLS:  Life alert? No  Use of a cane, walker or w/c? No  Grab bars in the bathroom? No  Shower chair or bench in shower? Yes  Elevated toilet seat or a handicapped toilet? No   TIMED UP AND GO:  Was the test performed? No .    Cognitive Function: MMSE - Mini Mental State Exam 10/14/2017 10/08/2016 05/21/2015  Not completed: - - (No Data)  Orientation to time '5 4 5  '$ Orientation to Place '5 5 5  '$ Registration '3 3 3  '$ Attention/ Calculation '5 5 5  '$ Recall '3 1 1  '$ Language- name 2 objects '2 2 2  '$ Language- repeat '1 1 1  '$ Language- follow 3 step command '3 3 3  '$ Language- read & follow direction '1 1 1  '$ Write a sentence '1 1 1  '$ Copy design '1 1 1  '$ Total score '30 27 28     '$ 6CIT Screen 10/31/2020 10/30/2019 10/16/2018  What Year? 0 points 0 points 0 points  What month? 0 points 0 points 0 points  What time? 0  points 0 points 0 points  Count back from 20 0 points 0 points 0 points  Months in reverse 2 points 0 points 2 points  Repeat phrase 2 points 0 points 6 points  Total Score 4 0 8    Immunizations Immunization History  Administered Date(s) Administered   Moderna Sars-Covid-2 Vaccination 04/16/2019, 05/14/2019, 01/31/2020    TDAP status: Due, Education has been provided regarding the importance of this vaccine. Advised may receive this vaccine at local pharmacy or Health Dept. Aware to provide a copy of the vaccination record if obtained from local pharmacy or Health Dept. Verbalized acceptance and understanding.  Flu Vaccine status: Declined, Education has been provided regarding the importance of this vaccine but patient still declined. Advised may receive this vaccine at local pharmacy or Health Dept. Aware to provide a copy of the vaccination record if obtained from local pharmacy or Health Dept. Verbalized acceptance and understanding.  Pneumococcal vaccine status: Declined,  Education has been provided regarding the importance of this vaccine but patient still declined. Advised may receive this vaccine at local pharmacy or Health Dept. Aware to provide a copy of the vaccination record if obtained from local pharmacy or Health Dept. Verbalized acceptance and understanding.   Covid-19 vaccine status: Information provided on how to obtain vaccines.   Qualifies for Shingles Vaccine? No   Zostavax completed No   Shingrix Completed?: No.    Education has been provided regarding the importance of this vaccine. Patient has been advised to call insurance company to  determine out of pocket expense if they have not yet received this vaccine. Advised may also receive vaccine at local pharmacy or Health Dept. Verbalized acceptance and understanding.  Screening Tests Health Maintenance  Topic Date Due   TETANUS/TDAP  Never done   Zoster Vaccines- Shingrix (1 of 2) Never done   OPHTHALMOLOGY EXAM   05/11/2018   COVID-19 Vaccine (4 - Booster for Moderna series) 04/24/2020   HEMOGLOBIN A1C  10/29/2020   URINE MICROALBUMIN  11/01/2020   PNA vac Low Risk Adult (1 of 2 - PCV13) 11/01/2020 (Originally 03/25/2009)   INFLUENZA VACCINE  02/16/2048 (Originally 09/15/2020)   FOOT EXAM  11/01/2020   Hepatitis C Screening  Completed   HPV VACCINES  Aged Out    Health Maintenance  Health Maintenance Due  Topic Date Due   TETANUS/TDAP  Never done   Zoster Vaccines- Shingrix (1 of 2) Never done   OPHTHALMOLOGY EXAM  05/11/2018   COVID-19 Vaccine (4 - Booster for Moderna series) 04/24/2020   HEMOGLOBIN A1C  10/29/2020   URINE MICROALBUMIN  11/01/2020    Colorectal cancer screening: No longer required.   Lung Cancer Screening: (Low Dose CT Chest recommended if Age 35-80 years, 30 pack-year currently smoking OR have quit w/in 15years.) does not qualify.   Lung Cancer Screening Referral: na  Additional Screening:  Hepatitis C Screening: does qualify; Completed 2020   Vision Screening: Recommended annual ophthalmology exams for early detection of glaucoma and other disorders of the eye. Is the patient up to date with their annual eye exam?  No  Who is the provider or what is the name of the office in which the patient attends annual eye exams? [facility] If pt is not established with a provider, would they like to be referred to a provider to establish care? No .   Dental Screening: Recommended annual dental exams for proper oral hygiene  Community Resource Referral / Chronic Care Management: CRR required this visit?  No   CCM required this visit?  No      Plan:     I have personally reviewed and noted the following in the patient's chart:   Medical and social history Use of alcohol, tobacco or illicit drugs  Current medications and supplements including opioid prescriptions. Patient is not currently taking opioid prescriptions. Functional ability and status Nutritional  status Physical activity Advanced directives List of other physicians Hospitalizations, surgeries, and ER visits in previous 12 months Vitals Screenings to include cognitive, depression, and falls Referrals and appointments  In addition, I have reviewed and discussed with patient certain preventive protocols, quality metrics, and best practice recommendations. A written personalized care plan for preventive services as well as general preventive health recommendations were provided to patient.     [name], NP   10/31/2020    Virtual Visit via Telephone Note  I connected withNAME@ on 10/31/20 at  8:30 AM EDT by telephone and verified that I am speaking with the correct person using two identifiers.  Location: Patient: home Provider: [place]   I discussed the limitations, risks, security and privacy concerns of performing an evaluation and management service by telephone and the availability of in person appointments. I also discussed with the patient that there may be a patient responsible charge related to this service. The patient expressed understanding and agreed to proceed.   I discussed the assessment and treatment plan with the patient. The patient was provided an opportunity to ask questions and all were answered. The  patient agreed with the plan and demonstrated an understanding of the instructions.   The patient was advised to call back or seek an in-person evaluation if the symptoms worsen or if the condition fails to improve as anticipated.  I provided 18 minutes of non-face-to-face time during this encounter.  [name]. Harle Battiest Avs printed and mailed

## 2020-11-04 ENCOUNTER — Other Ambulatory Visit: Payer: Medicare HMO

## 2020-11-04 ENCOUNTER — Other Ambulatory Visit: Payer: Self-pay

## 2020-11-04 DIAGNOSIS — E1141 Type 2 diabetes mellitus with diabetic mononeuropathy: Secondary | ICD-10-CM

## 2020-11-04 DIAGNOSIS — I10 Essential (primary) hypertension: Secondary | ICD-10-CM | POA: Diagnosis not present

## 2020-11-04 DIAGNOSIS — E782 Mixed hyperlipidemia: Secondary | ICD-10-CM | POA: Diagnosis not present

## 2020-11-05 LAB — COMPLETE METABOLIC PANEL WITH GFR
AG Ratio: 1.5 (calc) (ref 1.0–2.5)
ALT: 13 U/L (ref 9–46)
AST: 15 U/L (ref 10–35)
Albumin: 4.3 g/dL (ref 3.6–5.1)
Alkaline phosphatase (APISO): 91 U/L (ref 35–144)
BUN: 16 mg/dL (ref 7–25)
CO2: 19 mmol/L — ABNORMAL LOW (ref 20–32)
Calcium: 9.8 mg/dL (ref 8.6–10.3)
Chloride: 103 mmol/L (ref 98–110)
Creat: 0.86 mg/dL (ref 0.70–1.28)
Globulin: 2.9 g/dL (calc) (ref 1.9–3.7)
Glucose, Bld: 148 mg/dL — ABNORMAL HIGH (ref 65–99)
Potassium: 4.2 mmol/L (ref 3.5–5.3)
Sodium: 137 mmol/L (ref 135–146)
Total Bilirubin: 0.6 mg/dL (ref 0.2–1.2)
Total Protein: 7.2 g/dL (ref 6.1–8.1)
eGFR: 90 mL/min/{1.73_m2} (ref >=60)

## 2020-11-05 LAB — CBC WITH DIFFERENTIAL/PLATELET
Absolute Monocytes: 605 cells/uL (ref 200–950)
Basophils Absolute: 38 cells/uL (ref 0–200)
Basophils Relative: 0.6 %
Eosinophils Absolute: 19 cells/uL (ref 15–500)
Eosinophils Relative: 0.3 %
HCT: 43 % (ref 38.5–50.0)
Hemoglobin: 14.2 g/dL (ref 13.2–17.1)
Lymphs Abs: 3030 cells/uL (ref 850–3900)
MCH: 30.5 pg (ref 27.0–33.0)
MCHC: 33 g/dL (ref 32.0–36.0)
MCV: 92.3 fL (ref 80.0–100.0)
MPV: 11 fL (ref 7.5–12.5)
Monocytes Relative: 9.6 %
Neutro Abs: 2608 cells/uL (ref 1500–7800)
Neutrophils Relative %: 41.4 %
Platelets: 245 10*3/uL (ref 140–400)
RBC: 4.66 10*6/uL (ref 4.20–5.80)
RDW: 13.2 % (ref 11.0–15.0)
Total Lymphocyte: 48.1 %
WBC: 6.3 10*3/uL (ref 3.8–10.8)

## 2020-11-05 LAB — HEMOGLOBIN A1C
Hgb A1c MFr Bld: 6.6 % of total Hgb — ABNORMAL HIGH (ref <5.7)
Mean Plasma Glucose: 143 mg/dL
eAG (mmol/L): 7.9 mmol/L

## 2020-11-05 LAB — LIPID PANEL
Cholesterol: 187 mg/dL (ref <200)
HDL: 87 mg/dL (ref >=40)
LDL Cholesterol (Calc): 81 mg/dL (calc)
Non-HDL Cholesterol (Calc): 100 mg/dL (calc) (ref <130)
Total CHOL/HDL Ratio: 2.1 (calc) (ref <5.0)
Triglycerides: 96 mg/dL (ref <150)

## 2020-11-07 ENCOUNTER — Ambulatory Visit (INDEPENDENT_AMBULATORY_CARE_PROVIDER_SITE_OTHER): Payer: Medicare HMO | Admitting: Nurse Practitioner

## 2020-11-07 ENCOUNTER — Encounter: Payer: Self-pay | Admitting: Nurse Practitioner

## 2020-11-07 ENCOUNTER — Other Ambulatory Visit: Payer: Self-pay

## 2020-11-07 VITALS — BP 138/88 | HR 87 | Temp 97.0°F | Ht 69.0 in | Wt 253.0 lb

## 2020-11-07 DIAGNOSIS — E782 Mixed hyperlipidemia: Secondary | ICD-10-CM

## 2020-11-07 DIAGNOSIS — E1141 Type 2 diabetes mellitus with diabetic mononeuropathy: Secondary | ICD-10-CM

## 2020-11-07 DIAGNOSIS — I10 Essential (primary) hypertension: Secondary | ICD-10-CM

## 2020-11-07 DIAGNOSIS — C61 Malignant neoplasm of prostate: Secondary | ICD-10-CM

## 2020-11-07 MED ORDER — LISINOPRIL 2.5 MG PO TABS: 2.5000 mg | ORAL_TABLET | Freq: Every day | ORAL | 1 refills | Status: DC

## 2020-11-07 MED ORDER — ATORVASTATIN CALCIUM 20 MG PO TABS: 20.0000 mg | ORAL_TABLET | Freq: Every morning | ORAL | 1 refills | Status: DC

## 2020-11-07 NOTE — Progress Notes (Signed)
Careteam: Patient Care Team: [name], NP as PCP - General (Geriatric Medicine) Festus Aloe, MD as Consulting Physician (Urology)  PLACE OF SERVICE:  [place] Directive information Does Patient Have a Medical Advance Directive?: No, Would patient like information on creating a medical advance directive?: Yes (MAU/Ambulatory/Procedural Areas - Information given)  Allergies  Allergen Reactions   Other Other (See Comments)    Patient stated,"if I touch felt (material) I get "knots" on my body."   Peach [Prunus Persica] Other (See Comments)    Patient stated,"if I touch or eat peach fuzz, I get "knots" all over my face and mouth."     Chief Complaint  Patient presents with   Medical Management of Chronic Issues    6 month follow-up and discuss labs (copy printed). Discuss need for td/tdap, shingrix, covid #4, eye exam, and MALB. Foot exam today. Never received flu vaccine and doesn't plan on starting injection.      HPI: Patient is a 75 y.o. male for routine follow up.  Doing well. Had oral surgery so can not eat like he used to. A1c has improved. Not on medication for diabetes. No low blood sguar.   Declines flu vaccine.   Did not ever have chicken pox.   No changes in urinary frequency or flow, continues seeing urology and oncology due to BPH and prostate cancer. Continues on trelestar injection. And xtandi   DM- not on medication, has changed diet, a1c improved   LDL goal <70, at 80 on lipitor.  Review of Systems:  Review of Systems  Constitutional:  Negative for chills, fever and weight loss.  HENT:  Negative for tinnitus.   Respiratory:  Negative for cough, sputum production and shortness of breath.   Cardiovascular:  Negative for chest pain, palpitations and leg swelling.  Gastrointestinal:  Negative for abdominal pain, constipation, diarrhea and heartburn.  Genitourinary:  Negative for dysuria, frequency and urgency.   Musculoskeletal:  Negative for back pain, falls, joint pain and myalgias.  Skin: Negative.   Neurological:  Negative for dizziness and headaches.  Psychiatric/Behavioral:  Negative for depression and memory loss. The patient does not have insomnia.    Past Medical History:  Diagnosis Date   Arthritis    Blind left eye    Cyst in hand 10/2013   left hand, treated with antibiotic & pain medicine   Diabetes mellitus without complication (HCC)    I6N 7.1% 04/28/20   Heart murmur    Hepatitis A    25 YRS AGO   Hyperlipidemia    Pre-diabetes    Prostate cancer ([place])    Sleep apnea    ????   O2 DESAT  10/26/13  IN ED , no sleep study ever done per pt- no cpap   Past Surgical History:  Procedure Laterality Date   COLONOSCOPY     EYE SURGERY     LEFT AYE AS CHILD    JOINT REPLACEMENT Bilateral    LT KNEE 3 YRS AGO    MOUTH SURGERY  09/24/2020   MULTIPLE EXTRACTIONS WITH ALVEOLOPLASTY N/A 08/21/2020   Procedure: MULTIPLE EXTRACTION WITH ALVEOLOPLASTY;  Surgeon: Charlaine Dalton, DMD;  Location: [facility];  Service: Dentistry;  Laterality: N/A;   TOTAL KNEE ARTHROPLASTY Right 12/04/2013   dr Rhona Raider   TOTAL KNEE ARTHROPLASTY Right 12/04/2013   Procedure: TOTAL KNEE ARTHROPLASTY;  Surgeon: Hessie Dibble, MD;  Location: [facility];  Service: Orthopedics;  Laterality: Right;   Social History:  reports that he quit smoking about 37 years ago. His smoking use included cigarettes. He has a 2.50 pack-year smoking history. He has never used smokeless tobacco. He reports that he does not drink alcohol and does not use drugs.  Family History  Problem Relation Age of Onset   Stroke Mother    Heart attack Mother    Diabetes Mother    Stroke Father    Stomach cancer Brother    Prostate cancer Brother    Throat cancer Sister    Colon cancer Neg Hx    Esophageal cancer Neg Hx    Rectal cancer Neg Hx     Medications: Patient's Medications  New Prescriptions   No medications on file   Previous Medications   ASPIRIN EC 81 MG TABLET    Take 81 mg by mouth daily. Swallow whole.   ATORVASTATIN (LIPITOR) 20 MG TABLET    Take 1 tablet (20 mg total) by mouth every morning.   ENZALUTAMIDE (XTANDI) 40 MG CAPSULE    TAKE 4 CAPSULES BY MOUTH ONCE DAILY.   TRIPTORELIN (TRELESTAR LA) 11.25 MG INJECTION    Inject 11.25 mg into the muscle every 6 (six) months.  Modified Medications   No medications on file  Discontinued Medications   ASPIRIN EC 325 MG TABLET    Take 325 mg by mouth at bedtime.    Physical Exam:  Vitals:   11/07/20 0948  BP: 138/88  Pulse: 87  Temp: (!) 97 F (36.1 C)  TempSrc: Temporal  SpO2: 97%  Weight: 253 lb (114.8 kg)  Height: 5\' 9"  (1.753 m)   Body mass index is 37.36 kg/m. Wt Readings from Last 3 Encounters:  11/07/20 253 lb (114.8 kg)  08/21/20 261 lb (118.4 kg)  08/12/20 261 lb 8 oz (118.6 kg)    Physical Exam Constitutional:      General: He is not in acute distress.    Appearance: He is well-developed. He is not diaphoretic.  HENT:     Head: Normocephalic and atraumatic.     Right Ear: External ear normal.     Left Ear: External ear normal.     Mouth/Throat:     Pharynx: No oropharyngeal exudate.  Eyes:     Conjunctiva/sclera: Conjunctivae normal.     Pupils: Pupils are equal, round, and reactive to light.  Cardiovascular:     Rate and Rhythm: Normal rate and regular rhythm.     Heart sounds: Normal heart sounds.  Pulmonary:     Effort: Pulmonary effort is normal.     Breath sounds: Normal breath sounds.  Abdominal:     General: Bowel sounds are normal.     Palpations: Abdomen is soft.  Musculoskeletal:        General: No tenderness.     Cervical back: Normal range of motion and neck supple.     Right lower leg: No edema.     Left lower leg: No edema.  Skin:    General: Skin is warm and dry.  Neurological:     Mental Status: He is alert and oriented to person, place, and time.  Psychiatric:        Mood and Affect: Mood  normal.    Labs reviewed: Basic Metabolic Panel: Recent Labs    04/28/20 0000 08/05/20 1143 11/04/20 1011  NA 139 136 137  K 4.3 4.1 4.2  CL 107 105 103  CO2 20 23 19*  GLUCOSE 147* 161* 148*  BUN 19 16 16  CREATININE 0.75 0.86 0.86  CALCIUM 9.4 9.6 9.8   Liver Function Tests: Recent Labs    12/04/19 1101 04/01/20 1132 04/28/20 0000 08/05/20 1143 11/04/20 1011  AST 15 15 18 20 15   ALT 12 14 16 21 13   ALKPHOS 130* 123  --  140*  --   BILITOT 0.4 0.5 0.5 0.6 0.6  PROT 7.5 7.6 6.7 7.7 7.2  ALBUMIN 3.7 3.9  --  4.2  --    No results for input(s): LIPASE, AMYLASE in the last 8760 hours. No results for input(s): AMMONIA in the last 8760 hours. CBC: Recent Labs    04/28/20 0000 08/05/20 1143 11/04/20 1011  WBC 5.2 6.3 6.3  NEUTROABS 2,740 3.0 2,608  HGB 13.9 14.5 14.2  HCT 40.5 42.4 43.0  MCV 90.6 91.0 92.3  PLT 212 246 245   Lipid Panel: Recent Labs    04/28/20 0000 11/04/20 1011  CHOL 171 187  HDL 74 87  LDLCALC 82 81  TRIG 73 96  CHOLHDL 2.3 2.1   TSH: No results for input(s): TSH in the last 8760 hours. A1C: Lab Results  Component Value Date   HGBA1C 6.6 (H) 11/04/2020     Assessment/Plan 1. Mixed hyperlipidemia LDL goal <70, continue to work on dietary modification with lipitor daily - atorvastatin (LIPITOR) 20 MG tablet; Take 1 tablet (20 mg total) by mouth every morning.  Dispense: 90 tablet; Refill: 1  2. Type 2 diabetes mellitus with diabetic mononeuropathy, without long-term current use of insulin ([place]) -diet controlled. Encouraged dietary compliance, routine foot care/monitoring and to keep up with diabetic eye exams through ophthalmology  - lisinopril (ZESTRIL) 2.5 MG tablet; Take 1 tablet (2.5 mg total) by mouth daily.  Dispense: 90 tablet; Refill: 1 - Microalbumin, urine  3. Prostate cancer [facility]) -continues to be followed by oncology and urology. Continues on xtandi and trelestar injection.   4. Essential  hypertension Controlled at this time. Will add lisinopril for renal protection due to DM.   5. Morbid obesity ([place]) - has lost weight due to poor dentition but education provided on healthy weight loss through increase in physical activity and proper nutrition   Next appt: 6 weeks for follow up lisinopril start and bmp Ceria Suminski K. [place], [facility] [phone]

## 2020-11-07 NOTE — Patient Instructions (Addendum)
Make appt with Dr Katy Fitch for diabetic eye exam  To get tdap and COVID booster at local pharmacy.

## 2020-11-08 LAB — MICROALBUMIN, URINE: Microalb, Ur: 1 mg/dL

## 2020-11-25 ENCOUNTER — Other Ambulatory Visit (HOSPITAL_COMMUNITY): Payer: Self-pay

## 2020-12-01 ENCOUNTER — Other Ambulatory Visit (HOSPITAL_COMMUNITY): Payer: Self-pay

## 2020-12-09 ENCOUNTER — Inpatient Hospital Stay: Payer: Medicare HMO | Attending: Oncology

## 2020-12-09 ENCOUNTER — Other Ambulatory Visit: Payer: Self-pay

## 2020-12-09 DIAGNOSIS — C61 Malignant neoplasm of prostate: Secondary | ICD-10-CM | POA: Insufficient documentation

## 2020-12-09 LAB — CBC WITH DIFFERENTIAL (CANCER CENTER ONLY)
Abs Immature Granulocytes: 0.03 10*3/uL (ref 0.00–0.07)
Basophils Absolute: 0 10*3/uL (ref 0.0–0.1)
Basophils Relative: 1 %
Eosinophils Absolute: 0.1 10*3/uL (ref 0.0–0.5)
Eosinophils Relative: 1 %
HCT: 40.6 % (ref 39.0–52.0)
Hemoglobin: 14 g/dL (ref 13.0–17.0)
Immature Granulocytes: 1 %
Lymphocytes Relative: 50 %
Lymphs Abs: 3 10*3/uL (ref 0.7–4.0)
MCH: 31.4 pg (ref 26.0–34.0)
MCHC: 34.5 g/dL (ref 30.0–36.0)
MCV: 91 fL (ref 80.0–100.0)
Monocytes Absolute: 0.6 10*3/uL (ref 0.1–1.0)
Monocytes Relative: 10 %
Neutro Abs: 2.1 10*3/uL (ref 1.7–7.7)
Neutrophils Relative %: 37 %
Platelet Count: 226 10*3/uL (ref 150–400)
RBC: 4.46 MIL/uL (ref 4.22–5.81)
RDW: 13.4 % (ref 11.5–15.5)
WBC Count: 5.9 10*3/uL (ref 4.0–10.5)
nRBC: 0 % (ref 0.0–0.2)

## 2020-12-09 LAB — CMP (CANCER CENTER ONLY)
ALT: 15 U/L (ref 0–44)
AST: 16 U/L (ref 15–41)
Albumin: 3.9 g/dL (ref 3.5–5.0)
Alkaline Phosphatase: 100 U/L (ref 38–126)
Anion gap: 13 (ref 5–15)
BUN: 15 mg/dL (ref 8–23)
CO2: 16 mmol/L — ABNORMAL LOW (ref 22–32)
Calcium: 9.5 mg/dL (ref 8.9–10.3)
Chloride: 110 mmol/L (ref 98–111)
Creatinine: 0.83 mg/dL (ref 0.61–1.24)
GFR, Estimated: >60 mL/min (ref >60)
Glucose, Bld: 125 mg/dL — ABNORMAL HIGH (ref 70–99)
Potassium: 4 mmol/L (ref 3.5–5.1)
Sodium: 139 mmol/L (ref 135–145)
Total Bilirubin: 0.8 mg/dL (ref 0.3–1.2)
Total Protein: 7.5 g/dL (ref 6.5–8.1)

## 2020-12-10 LAB — PROSTATE-SPECIFIC AG, SERUM (LABCORP): Prostate Specific Ag, Serum: 3.6 ng/mL (ref 0.0–4.0)

## 2020-12-16 ENCOUNTER — Inpatient Hospital Stay: Payer: Medicare HMO | Attending: Oncology | Admitting: Oncology

## 2020-12-16 ENCOUNTER — Other Ambulatory Visit: Payer: Self-pay

## 2020-12-16 VITALS — BP 148/69 | HR 99 | Temp 98.3°F | Resp 19 | Ht 69.0 in | Wt 253.7 lb

## 2020-12-16 DIAGNOSIS — I10 Essential (primary) hypertension: Secondary | ICD-10-CM | POA: Diagnosis not present

## 2020-12-16 DIAGNOSIS — C61 Malignant neoplasm of prostate: Secondary | ICD-10-CM | POA: Diagnosis not present

## 2020-12-16 NOTE — Progress Notes (Signed)
Hematology and Oncology Follow Up Visit  Samuel [name] [id number] 1945-11-09 75 y.o. 12/16/2020 9:57 AM Dewaine Oats, [name], NPEubanks, [name], NP   Principle Diagnosis: 75 year old man with prostate cancer diagnosed in 2010.  He developed castration- resistant with biochemical relapse in 2018.    Prior Therapy: He was treated with external beam radiation and androgen deprivation.  He developed biochemical relapse with a PSA rise and he has been on combined androgen deprivation with [place] agonist on bicalutamide. PSA in June 2016 was 6.11, in December 2016 was 10, in June 2017 was 9 and in September 2017 was up to 12.  Staging workup including CT scan of the abdomen and pelvis as well as bone scan showed no evidence of metastatic disease.  His PSA was up to 22.5 in April 2018.   Current therapy:   Androgen Deprivation given under the care of Dr. Junious Silk.   Xtandi 160 mg daily started in May 2018.  Interim History: Samuel [name] returns today for a follow-up visit.  Since the last visit, he reports feeling well without any major complaints.  He denies any nausea, vomiting or abdominal pain.  He denies any palpitation or difficulty breathing.  He denies any bone pain or pathological fractures.  He continues to tolerate Xtandi without any issues.  He denies any excessive fatigue or worsening neuropathy.              Medications: Reviewed without changes. Current Outpatient Medications  Medication Sig Dispense Refill   aspirin EC 81 MG tablet Take 81 mg by mouth daily. Swallow whole.     atorvastatin (LIPITOR) 20 MG tablet Take 1 tablet (20 mg total) by mouth every morning. 90 tablet 1   enzalutamide (XTANDI) 40 MG capsule TAKE 4 CAPSULES BY MOUTH ONCE DAILY. 120 capsule 1   lisinopril (ZESTRIL) 2.5 MG tablet Take 1 tablet (2.5 mg total) by mouth daily. 90 tablet 1   triptorelin (TRELESTAR LA) 11.25 MG injection Inject 11.25 mg into the muscle every 6 (six) months.     No current  facility-administered medications for this visit.     Allergies:  Allergies  Allergen Reactions   Other Other (See Comments)    Patient stated,"if I touch felt (material) I get "knots" on my body."   Peach [Prunus Persica] Other (See Comments)    Patient stated,"if I touch or eat peach fuzz, I get "knots" all over my face and mouth."        Physical Exam:      Blood pressure (!) 148/69, pulse 99, temperature 98.3 F (36.8 C), temperature source Temporal, resp. rate 19, height 5\' 9"  (1.753 m), weight 253 lb 11.2 oz (115.1 kg), SpO2 98 %.      ECOG: 1   General appearance: Alert, awake without any distress. Head: Atraumatic without abnormalities Oropharynx: Without any thrush or ulcers. Eyes: No scleral icterus. Lymph nodes: No lymphadenopathy noted in the cervical, supraclavicular, or axillary nodes Heart:regular rate and rhythm, without any murmurs or gallops.   Lung: Clear to auscultation without any rhonchi, wheezes or dullness to percussion. Abdomin: Soft, nontender without any shifting dullness or ascites. Musculoskeletal: No clubbing or cyanosis. Neurological: No motor or sensory deficits. Skin: No rashes or lesions.             Lab Results: Lab Results  Component Value Date   WBC 5.9 12/09/2020   HGB 14.0 12/09/2020   HCT 40.6 12/09/2020   MCV 91.0 12/09/2020   PLT 226 12/09/2020  Chemistry      Component Value Date/Time   NA 139 12/09/2020 1052   NA 137 12/15/2016 1421   K 4.0 12/09/2020 1052   K 4.4 12/15/2016 1421   CL 110 12/09/2020 1052   CO2 16 (L) 12/09/2020 1052   CO2 19 (L) 12/15/2016 1421   BUN 15 12/09/2020 1052   BUN 13.4 12/15/2016 1421   CREATININE 0.83 12/09/2020 1052   CREATININE 0.86 11/04/2020 1011   CREATININE 0.8 12/15/2016 1421      Component Value Date/Time   CALCIUM 9.5 12/09/2020 1052   CALCIUM 9.4 12/15/2016 1421   ALKPHOS 100 12/09/2020 1052   ALKPHOS 92 12/15/2016 1421   AST 16 12/09/2020 1052    AST 17 12/15/2016 1421   ALT 15 12/09/2020 1052   ALT 12 12/15/2016 1421   BILITOT 0.8 12/09/2020 1052   BILITOT 0.37 12/15/2016 1421        Results for Samuel [name], Samuel [name] (MRN [id number]) as of 12/16/2020 09:59  Ref. Range 08/05/2020 11:43 12/09/2020 10:52  Prostate Specific Ag, Serum Latest Ref Range: 0.0 - 4.0 ng/mL 3.6 3.6         Impression and Plan:   75 year old man with   1.  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    His disease status was updated at this time and treatment choices were reviewed.  His PSA continues to be stable without any evidence of disease progression.  Risks and benefits of continuing Xtandi were reviewed.  Different salvage therapy options including systemic chemotherapy among others were reiterated.  These will be deferred unless he has advanced disease.  He is agreeable with this plan.   2. Androgen depravation: I recommended continuing this indefinitely which she is receiving under the care of of Dr. Junious Silk.  3.  Hypertension: Blood pressure is under control at this time we will continue to monitor.  4. Follow-up: He will return in 4 months for a follow-up visit.  30  minutes were spent on this encounter.  The time was dedicated to reviewing laboratory data, disease status update and outlining future plan of care.  Zola Button, MD 11/1/20229:57 AM

## 2020-12-25 ENCOUNTER — Other Ambulatory Visit: Payer: Self-pay | Admitting: Oncology

## 2020-12-25 ENCOUNTER — Other Ambulatory Visit (HOSPITAL_COMMUNITY): Payer: Self-pay

## 2020-12-25 DIAGNOSIS — C61 Malignant neoplasm of prostate: Secondary | ICD-10-CM

## 2020-12-25 MED ORDER — XTANDI 40 MG PO CAPS
ORAL_CAPSULE | ORAL | 1 refills | Status: DC
Start: 2020-12-25 — End: 2021-02-25
  Filled 2020-12-25: qty 120, 30d supply, fill #0
  Filled 2021-01-28: qty 120, 30d supply, fill #1

## 2020-12-31 ENCOUNTER — Other Ambulatory Visit (HOSPITAL_COMMUNITY): Payer: Self-pay

## 2021-01-28 ENCOUNTER — Other Ambulatory Visit (HOSPITAL_COMMUNITY): Payer: Self-pay

## 2021-01-29 ENCOUNTER — Other Ambulatory Visit (HOSPITAL_COMMUNITY): Payer: Self-pay

## 2021-02-25 ENCOUNTER — Other Ambulatory Visit: Payer: Self-pay | Admitting: Oncology

## 2021-02-25 ENCOUNTER — Other Ambulatory Visit (HOSPITAL_COMMUNITY): Payer: Self-pay

## 2021-02-25 DIAGNOSIS — C61 Malignant neoplasm of prostate: Secondary | ICD-10-CM

## 2021-02-25 MED ORDER — XTANDI 40 MG PO CAPS
ORAL_CAPSULE | ORAL | 1 refills | Status: DC
  Filled 2021-02-25: qty 120, 30d supply, fill #0
  Filled 2021-03-23: qty 120, 30d supply, fill #1

## 2021-03-03 ENCOUNTER — Other Ambulatory Visit (HOSPITAL_COMMUNITY): Payer: Self-pay

## 2021-03-23 ENCOUNTER — Other Ambulatory Visit (HOSPITAL_COMMUNITY): Payer: Self-pay

## 2021-03-25 ENCOUNTER — Other Ambulatory Visit (HOSPITAL_COMMUNITY): Payer: Self-pay

## 2021-03-26 ENCOUNTER — Other Ambulatory Visit (HOSPITAL_COMMUNITY): Payer: Self-pay

## 2021-04-09 ENCOUNTER — Other Ambulatory Visit (HOSPITAL_COMMUNITY): Payer: Self-pay

## 2021-04-15 ENCOUNTER — Inpatient Hospital Stay: Payer: Medicare HMO | Attending: Oncology

## 2021-04-15 ENCOUNTER — Other Ambulatory Visit: Payer: Self-pay

## 2021-04-15 DIAGNOSIS — C61 Malignant neoplasm of prostate: Secondary | ICD-10-CM | POA: Insufficient documentation

## 2021-04-15 DIAGNOSIS — I10 Essential (primary) hypertension: Secondary | ICD-10-CM | POA: Diagnosis not present

## 2021-04-15 LAB — CMP (CANCER CENTER ONLY)
ALT: 15 U/L (ref 0–44)
AST: 16 U/L (ref 15–41)
Albumin: 4.3 g/dL (ref 3.5–5.0)
Alkaline Phosphatase: 112 U/L (ref 38–126)
Anion gap: 12 (ref 5–15)
BUN: 17 mg/dL (ref 8–23)
CO2: 19 mmol/L — ABNORMAL LOW (ref 22–32)
Calcium: 9.7 mg/dL (ref 8.9–10.3)
Chloride: 105 mmol/L (ref 98–111)
Creatinine: 0.97 mg/dL (ref 0.61–1.24)
GFR, Estimated: >60 mL/min (ref >60)
Glucose, Bld: 169 mg/dL — ABNORMAL HIGH (ref 70–99)
Potassium: 4 mmol/L (ref 3.5–5.1)
Sodium: 136 mmol/L (ref 135–145)
Total Bilirubin: 0.5 mg/dL (ref 0.3–1.2)
Total Protein: 7.7 g/dL (ref 6.5–8.1)

## 2021-04-15 LAB — CBC WITH DIFFERENTIAL (CANCER CENTER ONLY)
Abs Immature Granulocytes: 0.02 10*3/uL (ref 0.00–0.07)
Basophils Absolute: 0 10*3/uL (ref 0.0–0.1)
Basophils Relative: 1 %
Eosinophils Absolute: 0 10*3/uL (ref 0.0–0.5)
Eosinophils Relative: 0 %
HCT: 43.2 % (ref 39.0–52.0)
Hemoglobin: 14.5 g/dL (ref 13.0–17.0)
Immature Granulocytes: 0 %
Lymphocytes Relative: 42 %
Lymphs Abs: 2.3 10*3/uL (ref 0.7–4.0)
MCH: 31 pg (ref 26.0–34.0)
MCHC: 33.6 g/dL (ref 30.0–36.0)
MCV: 92.5 fL (ref 80.0–100.0)
Monocytes Absolute: 0.5 10*3/uL (ref 0.1–1.0)
Monocytes Relative: 9 %
Neutro Abs: 2.7 10*3/uL (ref 1.7–7.7)
Neutrophils Relative %: 48 %
Platelet Count: 235 10*3/uL (ref 150–400)
RBC: 4.67 MIL/uL (ref 4.22–5.81)
RDW: 13 % (ref 11.5–15.5)
WBC Count: 5.6 10*3/uL (ref 4.0–10.5)
nRBC: 0 % (ref 0.0–0.2)

## 2021-04-16 ENCOUNTER — Other Ambulatory Visit (HOSPITAL_COMMUNITY): Payer: Self-pay

## 2021-04-16 ENCOUNTER — Other Ambulatory Visit: Payer: Self-pay | Admitting: Oncology

## 2021-04-16 DIAGNOSIS — C61 Malignant neoplasm of prostate: Secondary | ICD-10-CM

## 2021-04-16 LAB — PROSTATE-SPECIFIC AG, SERUM (LABCORP): Prostate Specific Ag, Serum: 4.1 ng/mL — ABNORMAL HIGH (ref 0.0–4.0)

## 2021-04-16 MED ORDER — XTANDI 40 MG PO CAPS
ORAL_CAPSULE | ORAL | 1 refills | Status: DC
  Filled 2021-04-16: qty 120, 30d supply, fill #0
  Filled 2021-06-01: qty 120, 30d supply, fill #1

## 2021-04-20 ENCOUNTER — Other Ambulatory Visit (HOSPITAL_COMMUNITY): Payer: Self-pay

## 2021-04-22 ENCOUNTER — Other Ambulatory Visit: Payer: Self-pay

## 2021-04-22 ENCOUNTER — Inpatient Hospital Stay (HOSPITAL_BASED_OUTPATIENT_CLINIC_OR_DEPARTMENT_OTHER): Payer: Medicare HMO | Admitting: Oncology

## 2021-04-22 ENCOUNTER — Other Ambulatory Visit (HOSPITAL_COMMUNITY): Payer: Self-pay

## 2021-04-22 VITALS — BP 113/69 | HR 97 | Temp 97.6°F | Resp 18 | Ht 69.0 in | Wt 251.3 lb

## 2021-04-22 DIAGNOSIS — I10 Essential (primary) hypertension: Secondary | ICD-10-CM | POA: Diagnosis not present

## 2021-04-22 DIAGNOSIS — C61 Malignant neoplasm of prostate: Secondary | ICD-10-CM

## 2021-04-22 NOTE — Progress Notes (Signed)
Hematology and Oncology Follow Up Visit ? ?Kaye Mitro ?[id number] ?06-03-1945 76 y.o. ?04/22/2021 9:19 AM ?Brita Romp, [name], NP  ? 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  He was initially treated with localized disease in 2010.     ? ?Prior Therapy: ?He was treated with external beam radiation and androgen deprivation.  ?He developed biochemical relapse with a PSA rise and he has been on combined androgen deprivation with [place] agonist on bicalutamide. PSA in June 2016 was 6.11, in December 2016 was 10, in June 2017 was 9 and in September 2017 was up to 12.  ?Staging workup including CT scan of the abdomen and pelvis as well as bone scan showed no evidence of metastatic disease.  His PSA was up to 22.5 in April 2018. ? ? ?Current therapy:  ? ?Androgen Deprivation given under the care of Dr. Junious Silk.  ? ?Xtandi 160 mg daily started in May 2018. ? ?Interim History: Mr. Finelli returns today for a follow-up visit.  Since last visit, he reports no major changes in his health.  He continues to tolerate Xtandi without any major complaints.  He denies any nausea, vomiting or abdominal pain.  Denies any increased blood pressure or excessive fatigue.  His performance status and quality of life remains unchanged. ? ? ? ? ? ? ? ? ? ? ? ? ? ?Medications: Updated on review. ?Current Outpatient Medications  ?Medication Sig Dispense Refill  ? aspirin EC 81 MG tablet Take 81 mg by mouth daily. Swallow whole.    ? atorvastatin (LIPITOR) 20 MG tablet Take 1 tablet (20 mg total) by mouth every morning. 90 tablet 1  ? enzalutamide (XTANDI) 40 MG capsule TAKE 4 CAPSULES BY MOUTH ONCE DAILY. 120 capsule 1  ? lisinopril (ZESTRIL) 2.5 MG tablet Take 1 tablet (2.5 mg total) by mouth daily. 90 tablet 1  ? triptorelin (TRELESTAR LA) 11.25 MG injection Inject 11.25 mg into the muscle every 6 (six) months.    ? ?No current facility-administered  medications for this visit.  ? ? ? ?Allergies:  ?Allergies  ?Allergen Reactions  ? Other Other (See Comments)  ?  Patient stated,"if I touch felt (material) I get "knots" on my body."  ? Peach [Prunus Persica] Other (See Comments)  ?  Patient stated,"if I touch or eat peach fuzz, I get "knots" all over my face and mouth."   ? ? ? ? ? ?Physical Exam: ? ? ? ? ? ?Blood pressure 113/69, pulse 97, temperature 97.6 ?F (36.4 ?C), temperature source Temporal, resp. rate 18, height '5\' 9"'$  (1.753 m), weight 251 lb 4.8 oz (114 kg), SpO2 99 %. ? ? ? ? ? ?ECOG: 1 ? ? ?General appearance: Comfortable appearing without any discomfort ?Head: Normocephalic without any trauma ?Oropharynx: Mucous membranes are moist and pink without any thrush or ulcers. ?Eyes: Pupils are equal and round reactive to light. ?Lymph nodes: No cervical, supraclavicular, inguinal or axillary lymphadenopathy.   ?Heart:regular rate and rhythm.  S1 and S2 without leg edema. ?Lung: Clear without any rhonchi or wheezes.  No dullness to percussion. ?Abdomin: Soft, nontender, nondistended with good bowel sounds.  No hepatosplenomegaly. ?Musculoskeletal: No joint deformity or effusion.  Full range of motion noted. ?Neurological: No deficits noted on motor, sensory and deep tendon reflex exam. ?Skin: No petechial rash or dryness.  Appeared moist.  ? ? ? ? ? ? ? ? ? ? ? ? ? ?Lab Results: ?Lab Results  ?  Component Value Date  ? WBC 5.6 04/15/2021  ? HGB 14.5 04/15/2021  ? HCT 43.2 04/15/2021  ? MCV 92.5 04/15/2021  ? PLT 235 04/15/2021  ? ?  Chemistry   ?   ?Component Value Date/Time  ? NA 136 04/15/2021 1021  ? NA 137 12/15/2016 1421  ? K 4.0 04/15/2021 1021  ? K 4.4 12/15/2016 1421  ? CL 105 04/15/2021 1021  ? CO2 19 (L) 04/15/2021 1021  ? CO2 19 (L) 12/15/2016 1421  ? BUN 17 04/15/2021 1021  ? BUN 13.4 12/15/2016 1421  ? CREATININE 0.97 04/15/2021 1021  ? CREATININE 0.86 11/04/2020 1011  ? CREATININE 0.8 12/15/2016 1421  ?    ?Component Value Date/Time  ? CALCIUM  9.7 04/15/2021 1021  ? CALCIUM 9.4 12/15/2016 1421  ? ALKPHOS 112 04/15/2021 1021  ? ALKPHOS 92 12/15/2016 1421  ? AST 16 04/15/2021 1021  ? AST 17 12/15/2016 1421  ? ALT 15 04/15/2021 1021  ? ALT 12 12/15/2016 1421  ? BILITOT 0.5 04/15/2021 1021  ? BILITOT 0.37 12/15/2016 1421  ?  ? ? ? ? ? Latest Reference Range & Units 08/05/20 11:43 12/09/20 10:52 04/15/21 10:21  ?Prostate Specific Ag, Serum 0.0 - 4.0 ng/mL 3.6 3.6 4.1 (H)  ?(H): Data is abnormally high ? ? ? ? ? ? ? ?Impression and Plan: ? ? ?76 year old man with ?  ?1.  Prostate cancer diagnosed in 2010.  He developed castration-resistant with biochemical relapse in 2018. ? ? ?He is currently on Xtandi which she has tolerated very well without any major complaints.  Risks and benefits of continuing this treatment long-term were discussed.  Complications include hypertension, fatigue and edema were reiterated.  Alternative treatment options such as Zytiga and chemotherapy will be deferred unless he has rapid rise in his PSA.  Current PSA is 4.1 and will continue to monitor and will defer salvage therapy on rapid rise. ? ? ?2. Androgen depravation: This will be continued indefinitely.  He is currently receiving Eligard every 6 months under the care of Dr. Junious Silk. ? ?3.  Hypertension: No issues reported with his blood pressure at this time. ? ?4. Follow-up: He will return in 4 months for repeat follow-up. ? ?30  minutes were dedicated to this visit.  The time was spent on reviewing laboratory data, disease status update and outlining future plan of care discussion. ? ?Zola Button, MD ?3/8/20239:19 AM ?

## 2021-05-12 ENCOUNTER — Other Ambulatory Visit (HOSPITAL_COMMUNITY): Payer: Self-pay

## 2021-05-14 ENCOUNTER — Other Ambulatory Visit: Payer: Self-pay | Admitting: *Deleted

## 2021-05-14 DIAGNOSIS — E782 Mixed hyperlipidemia: Secondary | ICD-10-CM

## 2021-05-14 MED ORDER — ATORVASTATIN CALCIUM 20 MG PO TABS: 20.0000 mg | ORAL_TABLET | Freq: Every morning | ORAL | 1 refills | Status: DC

## 2021-05-14 NOTE — Telephone Encounter (Signed)
Patient requested refill

## 2021-05-29 ENCOUNTER — Ambulatory Visit (INDEPENDENT_AMBULATORY_CARE_PROVIDER_SITE_OTHER): Payer: Medicare HMO | Admitting: Nurse Practitioner

## 2021-05-29 ENCOUNTER — Encounter: Payer: Self-pay | Admitting: Nurse Practitioner

## 2021-05-29 VITALS — BP 122/78 | HR 86 | Temp 98.2°F | Ht 69.0 in | Wt 250.6 lb

## 2021-05-29 DIAGNOSIS — I700 Atherosclerosis of aorta: Secondary | ICD-10-CM | POA: Diagnosis not present

## 2021-05-29 DIAGNOSIS — C61 Malignant neoplasm of prostate: Secondary | ICD-10-CM

## 2021-05-29 DIAGNOSIS — E782 Mixed hyperlipidemia: Secondary | ICD-10-CM

## 2021-05-29 DIAGNOSIS — I10 Essential (primary) hypertension: Secondary | ICD-10-CM | POA: Diagnosis not present

## 2021-05-29 DIAGNOSIS — E1141 Type 2 diabetes mellitus with diabetic mononeuropathy: Secondary | ICD-10-CM

## 2021-05-29 MED ORDER — LISINOPRIL 2.5 MG PO TABS: 2.5000 mg | ORAL_TABLET | Freq: Every day | ORAL | 1 refills | Status: DC

## 2021-05-29 MED ORDER — ASPIRIN EC 325 MG PO TBEC: 325.0000 mg | DELAYED_RELEASE_TABLET | Freq: Every day | ORAL | 0 refills | Status: AC

## 2021-05-29 NOTE — Progress Notes (Signed)
? ? ?Careteam: ?Patient Care Team: ?[name], NP as PCP - General (Geriatric Medicine) ?Festus Aloe, MD as Consulting Physician (Urology) ? ?PLACE OF SERVICE:  ?[facility]  ?Advanced Directive information ?Does Patient Have a Medical Advance Directive?: No, Would patient like information on creating a medical advance directive?: No - Patient declined ? ?Allergies  ?Allergen Reactions  ? Other Other (See Comments)  ?  Patient stated,"if I touch felt (material) I get "knots" on my body."  ? Peach [Prunus Persica] Other (See Comments)  ?  Patient stated,"if I touch or eat peach fuzz, I get "knots" all over my face and mouth."   ? ? ?Chief Complaint  ?Patient presents with  ? Medical Management of Chronic Issues  ?  7 month follow-up. Discuss need for td/tdap, PCV, eye exam, covid boosters, and A1c. Patient denies receiving any vaccines since last visit. [facility] verified. Patient stated that he is taking 25 mg of Aspirin not the '81mg'$ .   ? ? ? ?HPI: Patient is a 76 y.o. male for routine follow up.  ? ?DM- did not start lisinopril since his renal function was good  ?Doctor groat saw him prior to the pandemic and afterwards he has not gone back ? ?He is taking ASA 325 mg daily, reports he did not feel as good taking the ASA 81 mg daily  ? ?Continues to follow up with urology and on treatment for prostate cancer, no new symptoms.  ? ?Blood pressure controlled without medication ? ?Hyperlipidemia- continues on Lipitor daily  ? ?Review of Systems:  ?Review of Systems  ?Constitutional:  Negative for chills, fever and weight loss.  ?HENT:  Negative for tinnitus.   ?Respiratory:  Negative for cough, sputum production and shortness of breath.   ?Cardiovascular:  Negative for chest pain, palpitations and leg swelling.  ?Gastrointestinal:  Negative for abdominal pain, constipation, diarrhea and heartburn.  ?Genitourinary:  Negative for dysuria, frequency and urgency.  ?Musculoskeletal:  Negative for back pain, falls,  joint pain and myalgias.  ?Skin: Negative.   ?Neurological:  Negative for dizziness and headaches.  ?Psychiatric/Behavioral:  Negative for depression and memory loss. The patient does not have insomnia.   ? ?Past Medical History:  ?Diagnosis Date  ? Arthritis   ? Blind left eye   ? Cyst in hand 10/2013  ? left hand, treated with antibiotic & pain medicine  ? Diabetes mellitus without complication ([place])   ? A1c 7.1% 04/28/20  ? Heart murmur   ? Hepatitis A   ? 25 YRS AGO  ? Hyperlipidemia   ? Pre-diabetes   ? Prostate cancer ([place])   ? Sleep apnea   ? ????   O2 DESAT  10/26/13  IN ED , no sleep study ever done per pt- no cpap  ? ?Past Surgical History:  ?Procedure Laterality Date  ? COLONOSCOPY    ? EYE SURGERY    ? LEFT AYE AS CHILD   ? JOINT REPLACEMENT Bilateral   ? LT KNEE 3 YRS AGO   ? MOUTH SURGERY  09/24/2020  ? MULTIPLE EXTRACTIONS WITH ALVEOLOPLASTY N/A 08/21/2020  ? Procedure: MULTIPLE EXTRACTION WITH ALVEOLOPLASTY;  Surgeon: Charlaine Dalton, DMD;  Location: [facility];  Service: Dentistry;  Laterality: N/A;  ? TOTAL KNEE ARTHROPLASTY Right 12/04/2013  ? dr Rhona Raider  ? TOTAL KNEE ARTHROPLASTY Right 12/04/2013  ? Procedure: TOTAL KNEE ARTHROPLASTY;  Surgeon: Hessie Dibble, MD;  Location: [facility];  Service: Orthopedics;  Laterality: Right;  ? ?Social History: ?  reports that he quit smoking about 38 years ago. His smoking use included cigarettes. He has a 2.50 pack-year smoking history. He has never used smokeless tobacco. He reports that he does not drink alcohol and does not use drugs. ? ?Family History  ?Problem Relation Age of Onset  ? Stroke Mother   ? Heart attack Mother   ? Diabetes Mother   ? Stroke Father   ? Stomach cancer Brother   ? Prostate cancer Brother   ? Throat cancer Sister   ? Colon cancer Neg Hx   ? Esophageal cancer Neg Hx   ? Rectal cancer Neg Hx   ? ? ?Medications: ?Patient's Medications  ?New Prescriptions  ? No medications on file  ?Previous Medications  ? ASPIRIN EC 81 MG TABLET    Take  81 mg by mouth daily. Swallow whole.  ? ATORVASTATIN (LIPITOR) 20 MG TABLET    Take 1 tablet (20 mg total) by mouth every morning.  ? ENZALUTAMIDE (XTANDI) 40 MG CAPSULE    TAKE 4 CAPSULES BY MOUTH ONCE DAILY.  ? LISINOPRIL (ZESTRIL) 2.5 MG TABLET    Take 1 tablet (2.5 mg total) by mouth daily.  ? TRIPTORELIN (TRELESTAR LA) 11.25 MG INJECTION    Inject 11.25 mg into the muscle every 6 (six) months.  ?Modified Medications  ? No medications on file  ?Discontinued Medications  ? No medications on file  ? ? ?Physical Exam: ? ?Vitals:  ? 05/29/21 1100  ?BP: 122/78  ?Pulse: 86  ?Temp: 98.2 ?F (36.8 ?C)  ?TempSrc: Temporal  ?SpO2: 93%  ?Weight: 250 lb 9.6 oz (113.7 kg)  ?Height: '5\' 9"'$  (1.753 m)  ? ?Body mass index is 37.01 kg/m?. ?Wt Readings from Last 3 Encounters:  ?05/29/21 250 lb 9.6 oz (113.7 kg)  ?04/22/21 251 lb 4.8 oz (114 kg)  ?12/16/20 253 lb 11.2 oz (115.1 kg)  ? ? ?Physical Exam ?Constitutional:   ?   General: He is not in acute distress. ?   Appearance: He is well-developed. He is obese. He is not diaphoretic.  ?HENT:  ?   Head: Normocephalic and atraumatic.  ?   Right Ear: External ear normal.  ?   Left Ear: External ear normal.  ?   Mouth/Throat:  ?   Pharynx: No oropharyngeal exudate.  ?Eyes:  ?   Conjunctiva/sclera: Conjunctivae normal.  ?   Pupils: Pupils are equal, round, and reactive to light.  ?Cardiovascular:  ?   Rate and Rhythm: Normal rate and regular rhythm.  ?   Heart sounds: Normal heart sounds.  ?Pulmonary:  ?   Effort: Pulmonary effort is normal.  ?   Breath sounds: Normal breath sounds.  ?Abdominal:  ?   General: Bowel sounds are normal.  ?   Palpations: Abdomen is soft.  ?Musculoskeletal:     ?   General: No tenderness.  ?   Cervical back: Normal range of motion and neck supple.  ?   Right lower leg: No edema.  ?   Left lower leg: No edema.  ?Skin: ?   General: Skin is warm and dry.  ?Neurological:  ?   Mental Status: He is alert and oriented to person, place, and time.  ? ? ?Labs  reviewed: ?Basic Metabolic Panel: ?Recent Labs  ?  11/04/20 ?1011 12/09/20 ?1052 04/15/21 ?1021  ?NA 137 139 136  ?K 4.2 4.0 4.0  ?CL 103 110 105  ?CO2 19* 16* 19*  ?GLUCOSE 148* 125* 169*  ?BUN 16 15  17  ?CREATININE 0.86 0.83 0.97  ?CALCIUM 9.8 9.5 9.7  ? ?Liver Function Tests: ?Recent Labs  ?  08/05/20 ?1143 11/04/20 ?1011 12/09/20 ?1052 04/15/21 ?1021  ?AST '20 15 16 16  '$ ?ALT '21 13 15 15  '$ ?ALKPHOS 140*  --  100 112  ?BILITOT 0.6 0.6 0.8 0.5  ?PROT 7.7 7.2 7.5 7.7  ?ALBUMIN 4.2  --  3.9 4.3  ? ?No results for input(s): LIPASE, AMYLASE in the last 8760 hours. ?No results for input(s): AMMONIA in the last 8760 hours. ?CBC: ?Recent Labs  ?  11/04/20 ?1011 12/09/20 ?1052 04/15/21 ?1021  ?WBC 6.3 5.9 5.6  ?NEUTROABS 2,608 2.1 2.7  ?HGB 14.2 14.0 14.5  ?HCT 43.0 40.6 43.2  ?MCV 92.3 91.0 92.5  ?PLT 245 226 235  ? ?Lipid Panel: ?Recent Labs  ?  11/04/20 ?1011  ?CHOL 187  ?HDL 87  ?[place] 81  ?TRIG 96  ?CHOLHDL 2.1  ? ?TSH: ?No results for input(s): TSH in the last 8760 hours. ?A1C: ?Lab Results  ?Component Value Date  ? HGBA1C 6.6 (H) 11/04/2020  ? ? ? ?Assessment/Plan ?1. Mixed hyperlipidemia ?cholesterol at goal. Continue current regimen. ? ?2. Type 2 diabetes mellitus with diabetic mononeuropathy, without long-term current use of insulin ([place]) ?-Encouraged dietary compliance, routine foot care/monitoring and to keep up with diabetic eye exams through ophthalmology  ?- Hemoglobin A1c ?- Microalbumin/Creatinine Ratio, Urine ?- Ambulatory referral to Ophthalmology ?-states he will try lisinopril for renal protection  ? ?3. Prostate cancer ([place]) ?-stable, followed by urology ? ?4. Morbid obesity ([place]) ?-education provided on healthy weight loss through increase in physical activity and proper nutrition  ? ?5. Atherosclerosis of aorta ([place]) ?-continues on statin and asa ?- aspirin EC 325 MG tablet; Take 1 tablet (325 mg total) by mouth daily.  Dispense: 30 tablet; Refill: 0 ? ?6. Essential hypertension ?-managed with diet.   ? ? ? ?Return in about 4 weeks (around 06/26/2021) for follow up on lisinopril . ?[name]. Dewaine Oats, AGNP ? ?[facility] ?[phone]  ?

## 2021-05-30 LAB — HEMOGLOBIN A1C
Hgb A1c MFr Bld: 6.6 % of total Hgb — ABNORMAL HIGH (ref <5.7)
Mean Plasma Glucose: 143 mg/dL
eAG (mmol/L): 7.9 mmol/L

## 2021-05-30 LAB — MICROALBUMIN / CREATININE URINE RATIO
Creatinine, Urine: 125 mg/dL (ref 20–320)
Microalb Creat Ratio: 4 mcg/mg creat (ref <30)
Microalb, Ur: 0.5 mg/dL

## 2021-06-01 ENCOUNTER — Other Ambulatory Visit (HOSPITAL_COMMUNITY): Payer: Self-pay

## 2021-06-02 ENCOUNTER — Other Ambulatory Visit (HOSPITAL_COMMUNITY): Payer: Self-pay

## 2021-06-02 ENCOUNTER — Other Ambulatory Visit: Payer: Self-pay | Admitting: Oncology

## 2021-06-02 ENCOUNTER — Telehealth: Payer: Self-pay

## 2021-06-02 DIAGNOSIS — C61 Malignant neoplasm of prostate: Secondary | ICD-10-CM

## 2021-06-02 MED ORDER — XTANDI 40 MG PO CAPS: ORAL_CAPSULE | ORAL | 2 refills | Status: DC

## 2021-06-08 NOTE — Telephone Encounter (Signed)
Called Xtandi support [employer] to follow up on status of assistance application.  ?Rep informed me application was still in process and they would be contacting patient to complete an assessment by phone.  ?Rep also faxed copy of assessment to our office in case they could not reach patient. ?Will follow up with [employer] support [employer] and patient to ensure assessment was completed. ? ?Wynn Maudlin CPHT ?Specialty Pharmacy Patient Advocate ?[place] ?Phone [phone] ?Fax [phone] ?06/08/2021 4:21 PM ? ?

## 2021-06-08 NOTE — Telephone Encounter (Signed)
Oral Oncology Patient Advocate Encounter ? ?Submitted onlince application for [employer] in an effort to reduce patient's out of pocket expense for Xtandi to $0.   ? ?Application completed online.  ? ?Xtandi patient assistance phone number for follow up is [phone]. ?Fax [phone]  ? ?This encounter will be updated until final determination.  ? ? ?Wynn Maudlin CPHT ?Specialty Pharmacy Patient Advocate ?[place] ?Phone [phone] ?Fax [phone] ?06/08/2021 4:19 PM ? ?

## 2021-06-15 NOTE — Telephone Encounter (Signed)
Patient is approved for Xtandi at no cost from [name] 06/12/21-02/14/22 ? ?[place] [phone] ? ?Wynn Maudlin CPHT ?Specialty Pharmacy Patient Advocate ?[place] ?Phone [phone] ?Fax [phone] ?06/15/2021 8:57 AM ? ?

## 2021-06-16 ENCOUNTER — Other Ambulatory Visit (HOSPITAL_COMMUNITY): Payer: Self-pay

## 2021-06-17 ENCOUNTER — Other Ambulatory Visit: Payer: Self-pay | Admitting: *Deleted

## 2021-06-17 DIAGNOSIS — C61 Malignant neoplasm of prostate: Secondary | ICD-10-CM

## 2021-06-17 MED ORDER — XTANDI 40 MG PO CAPS: ORAL_CAPSULE | ORAL | 2 refills | Status: DC

## 2021-06-24 DIAGNOSIS — C61 Malignant neoplasm of prostate: Secondary | ICD-10-CM | POA: Diagnosis not present

## 2021-06-24 DIAGNOSIS — M8580 Other specified disorders of bone density and structure, unspecified site: Secondary | ICD-10-CM | POA: Diagnosis not present

## 2021-06-25 NOTE — Progress Notes (Signed)
? ? ?Careteam: ?Patient Care Team: ?[name], NP as PCP - General (Geriatric Medicine) ?Festus Aloe, MD as Consulting Physician (Urology) ? ?PLACE OF SERVICE:  ?[facility]  ?Advanced Directive information ?Does Patient Have a Medical Advance Directive?: No, Would patient like information on creating a medical advance directive?: No - Patient declined ? ?Allergies  ?Allergen Reactions  ? Other Other (See Comments)  ?  Patient stated,"if I touch felt (material) I get "knots" on my body."  ? Peach [Prunus Persica] Other (See Comments)  ?  Patient stated,"if I touch or eat peach fuzz, I get "knots" all over my face and mouth."   ? ? ?Chief Complaint  ?Patient presents with  ? Follow-up  ?  Patient is here for a 4WK F/U for DM II, and Hyperlipidemia ?  ? ? ? ?HPI: Patient is a 76 y.o. male here for follow up on lisinopril. Started lisinopril due to DM for renal protection.  ?Blood pressure controlled today- no side effects from medication.  ? ?Reports he has had a chronic cough, started before the pandemic but wasn't often.  ?Now it has increased (increased prior to lisinopril start) and happens after he eats.  ?Reports he does not have trouble swallowing ?No shortness of breath ?Will cough up some "cold" at times.  ?No chest pains or leg edema.  ? ?Review of Systems:  ?Review of Systems  ?Constitutional:  Negative for chills, fever and weight loss.  ?HENT:  Negative for tinnitus.   ?Respiratory:  Positive for cough. Negative for sputum production and shortness of breath.   ?Cardiovascular:  Negative for chest pain, palpitations and leg swelling.  ?Gastrointestinal:  Negative for abdominal pain, constipation, diarrhea and heartburn.  ?Skin: Negative.   ?Neurological:  Negative for dizziness and headaches.  ? ?Past Medical History:  ?Diagnosis Date  ? Arthritis   ? Blind left eye   ? Cyst in hand 10/2013  ? left hand, treated with antibiotic & pain medicine  ? Diabetes mellitus without complication ([place])    ? A1c 7.1% 04/28/20  ? Heart murmur   ? Hepatitis A   ? 25 YRS AGO  ? Hyperlipidemia   ? Pre-diabetes   ? Prostate cancer ([place])   ? Sleep apnea   ? ????   O2 DESAT  10/26/13  IN ED , no sleep study ever done per pt- no cpap  ? ?Past Surgical History:  ?Procedure Laterality Date  ? COLONOSCOPY    ? EYE SURGERY    ? LEFT AYE AS CHILD   ? JOINT REPLACEMENT Bilateral   ? LT KNEE 3 YRS AGO   ? MOUTH SURGERY  09/24/2020  ? MULTIPLE EXTRACTIONS WITH ALVEOLOPLASTY N/A 08/21/2020  ? Procedure: MULTIPLE EXTRACTION WITH ALVEOLOPLASTY;  Surgeon: Charlaine Dalton, DMD;  Location: [place];  Service: Dentistry;  Laterality: N/A;  ? TOTAL KNEE ARTHROPLASTY Right 12/04/2013  ? dr Rhona Raider  ? TOTAL KNEE ARTHROPLASTY Right 12/04/2013  ? Procedure: TOTAL KNEE ARTHROPLASTY;  Surgeon: Hessie Dibble, MD;  Location: [place];  Service: Orthopedics;  Laterality: Right;  ? ?Social History: ?  reports that he quit smoking about 38 years ago. His smoking use included cigarettes. He has a 2.50 pack-year smoking history. He has never used smokeless tobacco. He reports that he does not drink alcohol and does not use drugs. ? ?Family History  ?Problem Relation Age of Onset  ? Stroke Mother   ? Heart attack Mother   ? Diabetes Mother   ?  Stroke Father   ? Stomach cancer Brother   ? Prostate cancer Brother   ? Throat cancer Sister   ? Colon cancer Neg Hx   ? Esophageal cancer Neg Hx   ? Rectal cancer Neg Hx   ? ? ?Medications: ?Patient's Medications  ?New Prescriptions  ? No medications on file  ?Previous Medications  ? ASPIRIN EC 325 MG TABLET    Take 1 tablet (325 mg total) by mouth daily.  ? ATORVASTATIN (LIPITOR) 20 MG TABLET    Take 1 tablet (20 mg total) by mouth every morning.  ? ENZALUTAMIDE (XTANDI) 40 MG CAPSULE    TAKE 4 CAPSULES BY MOUTH ONCE DAILY.  ? LISINOPRIL (ZESTRIL) 2.5 MG TABLET    Take 1 tablet (2.5 mg total) by mouth daily.  ? TRIPTORELIN (TRELESTAR LA) 11.25 MG INJECTION    Inject 11.25 mg into the muscle every 6 (six) months.   ?Modified Medications  ? No medications on file  ?Discontinued Medications  ? No medications on file  ? ? ?Physical Exam: ? ?Vitals:  ? 06/26/21 1402  ?BP: 132/80  ?Pulse: (!) 51  ?Resp: 18  ?Temp: (!) 97.1 ?F (36.2 ?C)  ?TempSrc: Temporal  ?SpO2: 96%  ?Weight: 251 lb (113.9 kg)  ?Height: '5\' 9"'$  (1.753 m)  ? ?Body mass index is 37.07 kg/m?. ?Wt Readings from Last 3 Encounters:  ?06/26/21 251 lb (113.9 kg)  ?05/29/21 250 lb 9.6 oz (113.7 kg)  ?04/22/21 251 lb 4.8 oz (114 kg)  ? ? ?Physical Exam ?Constitutional:   ?   General: He is not in acute distress. ?   Appearance: He is well-developed. He is not diaphoretic.  ?HENT:  ?   Head: Normocephalic and atraumatic.  ?   Right Ear: External ear normal.  ?   Left Ear: External ear normal.  ?   Mouth/Throat:  ?   Pharynx: No oropharyngeal exudate.  ?Eyes:  ?   Conjunctiva/sclera: Conjunctivae normal.  ?   Pupils: Pupils are equal, round, and reactive to light.  ?Neck:  ?   Vascular: No carotid bruit.  ?Cardiovascular:  ?   Rate and Rhythm: Normal rate and regular rhythm.  ?   Heart sounds: Normal heart sounds.  ?Pulmonary:  ?   Effort: Pulmonary effort is normal.  ?   Breath sounds: Normal breath sounds.  ?Abdominal:  ?   General: Bowel sounds are normal.  ?   Palpations: Abdomen is soft.  ?Musculoskeletal:     ?   General: No tenderness.  ?   Cervical back: Normal range of motion and neck supple.  ?   Right lower leg: No edema.  ?   Left lower leg: No edema.  ?Skin: ?   General: Skin is warm and dry.  ?Neurological:  ?   Mental Status: He is alert and oriented to person, place, and time.  ? ? ?Labs reviewed: ?Basic Metabolic Panel: ?Recent Labs  ?  11/04/20 ?1011 12/09/20 ?1052 04/15/21 ?1021  ?NA 137 139 136  ?K 4.2 4.0 4.0  ?CL 103 110 105  ?CO2 19* 16* 19*  ?GLUCOSE 148* 125* 169*  ?BUN '16 15 17  '$ ?CREATININE 0.86 0.83 0.97  ?CALCIUM 9.8 9.5 9.7  ? ?Liver Function Tests: ?Recent Labs  ?  08/05/20 ?1143 11/04/20 ?1011 12/09/20 ?1052 04/15/21 ?1021  ?AST '20 15 16 16  '$ ?ALT  '21 13 15 15  '$ ?ALKPHOS 140*  --  100 112  ?BILITOT 0.6 0.6 0.8 0.5  ?PROT 7.7  7.2 7.5 7.7  ?ALBUMIN 4.2  --  3.9 4.3  ? ?No results for input(s): LIPASE, AMYLASE in the last 8760 hours. ?No results for input(s): AMMONIA in the last 8760 hours. ?CBC: ?Recent Labs  ?  11/04/20 ?1011 12/09/20 ?1052 04/15/21 ?1021  ?WBC 6.3 5.9 5.6  ?NEUTROABS 2,608 2.1 2.7  ?HGB 14.2 14.0 14.5  ?HCT 43.0 40.6 43.2  ?MCV 92.3 91.0 92.5  ?PLT 245 226 235  ? ?Lipid Panel: ?Recent Labs  ?  11/04/20 ?1011  ?CHOL 187  ?HDL 87  ?[place] 81  ?TRIG 96  ?CHOLHDL 2.1  ? ?TSH: ?No results for input(s): TSH in the last 8760 hours. ?A1C: ?Lab Results  ?Component Value Date  ? HGBA1C 6.6 (H) 05/29/2021  ? ? ? ?Assessment/Plan ?1. Essential hypertension ?Controlled. Bp tolerating lisinopril 2.5 mg daily ?-continue dietary modifications. ?- BASIC METABOLIC PANEL WITH GFR ? ?2. Type 2 diabetes mellitus with diabetic mononeuropathy, without long-term current use of insulin ([place]) ?-continues on lisinopril for renal protection ?- BASIC METABOLIC PANEL WITH GFR ? ?3. Chronic cough ?-worsening cough over the last few months.  ?-can use mucinex DM by mouth twice daily for cough and congestion, may be related to pollen. Will get chest xray since he reports this has been ongoing.  ?- DG Chest 2 View; Future ? ? ? ?Return in about 5 months (around 11/26/2021) for routine follow up, labs with visit. Marland Kitchen ?[name]. Dewaine Oats, AGNP ? ?[facility] ?[phone]  ?

## 2021-06-26 ENCOUNTER — Encounter: Payer: Self-pay | Admitting: Nurse Practitioner

## 2021-06-26 ENCOUNTER — Ambulatory Visit (INDEPENDENT_AMBULATORY_CARE_PROVIDER_SITE_OTHER): Payer: Medicare HMO | Admitting: Nurse Practitioner

## 2021-06-26 ENCOUNTER — Ambulatory Visit
Admission: RE | Admit: 2021-06-26 | Discharge: 2021-06-26 | Disposition: A | Discharge status: Home / Self Care | Payer: Medicare HMO | Source: Ambulatory Visit | Attending: Nurse Practitioner | Admitting: Nurse Practitioner

## 2021-06-26 VITALS — BP 132/80 | HR 51 | Temp 97.1°F | Resp 18 | Ht 69.0 in | Wt 251.0 lb

## 2021-06-26 DIAGNOSIS — I10 Essential (primary) hypertension: Secondary | ICD-10-CM

## 2021-06-26 DIAGNOSIS — R053 Chronic cough: Secondary | ICD-10-CM

## 2021-06-26 DIAGNOSIS — E1141 Type 2 diabetes mellitus with diabetic mononeuropathy: Secondary | ICD-10-CM | POA: Diagnosis not present

## 2021-06-26 DIAGNOSIS — R059 Cough, unspecified: Secondary | ICD-10-CM | POA: Diagnosis not present

## 2021-06-26 NOTE — Patient Instructions (Signed)
Mucinex DM by mouth twice daily with full glass of water for 1-2 weeks.  ? ?

## 2021-06-27 LAB — BASIC METABOLIC PANEL WITH GFR
BUN: 14 mg/dL (ref 7–25)
CO2: 21 mmol/L (ref 20–32)
Calcium: 8.9 mg/dL (ref 8.6–10.3)
Chloride: 107 mmol/L (ref 98–110)
Creat: 0.71 mg/dL (ref 0.70–1.28)
Glucose, Bld: 105 mg/dL (ref 65–139)
Potassium: 4.4 mmol/L (ref 3.5–5.3)
Sodium: 138 mmol/L (ref 135–146)
eGFR: 94 mL/min/{1.73_m2} (ref >=60)

## 2021-07-01 ENCOUNTER — Other Ambulatory Visit: Payer: Self-pay | Admitting: Nurse Practitioner

## 2021-07-01 DIAGNOSIS — R053 Chronic cough: Secondary | ICD-10-CM

## 2021-07-01 MED ORDER — LOSARTAN POTASSIUM 25 MG PO TABS: 12.5000 mg | ORAL_TABLET | Freq: Every day | ORAL | 0 refills | Status: DC

## 2021-07-23 ENCOUNTER — Telehealth: Payer: Self-pay | Admitting: Oncology

## 2021-07-23 NOTE — Telephone Encounter (Signed)
Called patient regarding upcoming July appointments, patient is notified. 

## 2021-08-19 ENCOUNTER — Inpatient Hospital Stay: Payer: Medicare HMO | Attending: Oncology

## 2021-08-19 ENCOUNTER — Other Ambulatory Visit: Payer: Self-pay

## 2021-08-19 DIAGNOSIS — I10 Essential (primary) hypertension: Secondary | ICD-10-CM | POA: Diagnosis not present

## 2021-08-19 DIAGNOSIS — C61 Malignant neoplasm of prostate: Secondary | ICD-10-CM | POA: Insufficient documentation

## 2021-08-19 LAB — CMP (CANCER CENTER ONLY)
ALT: 10 U/L (ref 0–44)
AST: 14 U/L — ABNORMAL LOW (ref 15–41)
Albumin: 4 g/dL (ref 3.5–5.0)
Alkaline Phosphatase: 101 U/L (ref 38–126)
Anion gap: 7 (ref 5–15)
BUN: 13 mg/dL (ref 8–23)
CO2: 22 mmol/L (ref 22–32)
Calcium: 9 mg/dL (ref 8.9–10.3)
Chloride: 108 mmol/L (ref 98–111)
Creatinine: 0.76 mg/dL (ref 0.61–1.24)
GFR, Estimated: >60 mL/min (ref >60)
Glucose, Bld: 151 mg/dL — ABNORMAL HIGH (ref 70–99)
Potassium: 4.1 mmol/L (ref 3.5–5.1)
Sodium: 137 mmol/L (ref 135–145)
Total Bilirubin: 0.4 mg/dL (ref 0.3–1.2)
Total Protein: 7.2 g/dL (ref 6.5–8.1)

## 2021-08-19 LAB — CBC WITH DIFFERENTIAL (CANCER CENTER ONLY)
Abs Immature Granulocytes: 0.02 10*3/uL (ref 0.00–0.07)
Basophils Absolute: 0 10*3/uL (ref 0.0–0.1)
Basophils Relative: 1 %
Eosinophils Absolute: 0 10*3/uL (ref 0.0–0.5)
Eosinophils Relative: 0 %
HCT: 40.4 % (ref 39.0–52.0)
Hemoglobin: 14 g/dL (ref 13.0–17.0)
Immature Granulocytes: 0 %
Lymphocytes Relative: 43 %
Lymphs Abs: 2.3 10*3/uL (ref 0.7–4.0)
MCH: 31.7 pg (ref 26.0–34.0)
MCHC: 34.7 g/dL (ref 30.0–36.0)
MCV: 91.4 fL (ref 80.0–100.0)
Monocytes Absolute: 0.5 10*3/uL (ref 0.1–1.0)
Monocytes Relative: 9 %
Neutro Abs: 2.6 10*3/uL (ref 1.7–7.7)
Neutrophils Relative %: 47 %
Platelet Count: 222 10*3/uL (ref 150–400)
RBC: 4.42 MIL/uL (ref 4.22–5.81)
RDW: 13.3 % (ref 11.5–15.5)
WBC Count: 5.4 10*3/uL (ref 4.0–10.5)
nRBC: 0 % (ref 0.0–0.2)

## 2021-08-20 LAB — PROSTATE-SPECIFIC AG, SERUM (LABCORP): Prostate Specific Ag, Serum: 3.7 ng/mL (ref 0.0–4.0)

## 2021-08-26 ENCOUNTER — Other Ambulatory Visit: Payer: Self-pay

## 2021-08-26 ENCOUNTER — Inpatient Hospital Stay (HOSPITAL_BASED_OUTPATIENT_CLINIC_OR_DEPARTMENT_OTHER): Payer: Medicare HMO | Admitting: Oncology

## 2021-08-26 ENCOUNTER — Other Ambulatory Visit: Payer: Self-pay | Admitting: *Deleted

## 2021-08-26 VITALS — BP 147/80 | HR 92 | Temp 97.6°F | Resp 18 | Ht 69.0 in | Wt 255.3 lb

## 2021-08-26 DIAGNOSIS — C61 Malignant neoplasm of prostate: Secondary | ICD-10-CM

## 2021-08-26 DIAGNOSIS — I10 Essential (primary) hypertension: Secondary | ICD-10-CM | POA: Diagnosis not present

## 2021-08-26 MED ORDER — XTANDI 40 MG PO CAPS: ORAL_CAPSULE | ORAL | 2 refills | Status: DC

## 2021-08-26 NOTE — Progress Notes (Signed)
Hematology and Oncology Follow Up Visit  Samuel [name] [id number] December 27, 1945 76 y.o. 08/26/2021 2:46 PM Dewaine Oats, [name], NPEubanks, [name], NP   Principle Diagnosis: 76 year old man with prostate cancer diagnosed in 2010.  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  Prior Therapy: He was treated with external beam radiation and androgen deprivation.  He developed biochemical relapse with a PSA rise and he has been on combined androgen deprivation with [place] agonist on bicalutamide. PSA in June 2016 was 6.11, in December 2016 was 10, in June 2017 was 9 and in September 2017 was up to 12.  Staging workup including CT scan of the abdomen and pelvis as well as bone scan showed no evidence of metastatic disease.  His PSA was up to 22.5 in April 2018.   Current therapy:   Androgen Deprivation given under the care of Dr. Junious Silk.   Xtandi 160 mg daily started in May 2018.  Interim History: Mr. Stroschein is here for repeat follow-up.  Since last visit, he reports feeling well without any major complaints.  He denies any nausea, vomiting or abdominal pain.  He denies any bone pain or pathological fractures.  Performance status quality of life remains unchanged.  He denies any complications related to [facility].  Denies any worsening fatigue or headaches.  He denies any hospitalizations or illnesses.             Medications: Reviewed without changes. Current Outpatient Medications  Medication Sig Dispense Refill   aspirin EC 325 MG tablet Take 1 tablet (325 mg total) by mouth daily. 30 tablet 0   atorvastatin (LIPITOR) 20 MG tablet Take 1 tablet (20 mg total) by mouth every morning. 90 tablet 1   enzalutamide (XTANDI) 40 MG capsule TAKE 4 CAPSULES BY MOUTH ONCE DAILY. 120 capsule 2   losartan (COZAAR) 25 MG tablet Take 0.5 tablets (12.5 mg total) by mouth daily. 90 tablet 0   triptorelin (TRELESTAR LA) 11.25 MG injection Inject 11.25 mg into the muscle every 6 (six)  months.     No current facility-administered medications for this visit.     Allergies:  Allergies  Allergen Reactions   Other Other (See Comments)    Patient stated,"if I touch felt (material) I get "knots" on my body."   Peach [Prunus Persica] Other (See Comments)    Patient stated,"if I touch or eat peach fuzz, I get "knots" all over my face and mouth."        Physical Exam:        Blood pressure (!) 147/80, pulse 92, temperature 97.6 F (36.4 C), temperature source Temporal, resp. rate 18, height '5\' 9"'$  (1.753 m), weight 255 lb 4.8 oz (115.8 kg), SpO2 100 %.     ECOG: 1    General appearance: Alert, awake without any distress. Head: Atraumatic without abnormalities Oropharynx: Without any thrush or ulcers. Eyes: No scleral icterus. Lymph nodes: No lymphadenopathy noted in the cervical, supraclavicular, or axillary nodes Heart:regular rate and rhythm, without any murmurs or gallops.   Lung: Clear to auscultation without any rhonchi, wheezes or dullness to percussion. Abdomin: Soft, nontender without any shifting dullness or ascites. Musculoskeletal: No clubbing or cyanosis. Neurological: No motor or sensory deficits. Skin: No rashes or lesions.              Lab Results: Lab Results  Component Value Date   WBC 5.4 08/19/2021   HGB 14.0 08/19/2021   HCT 40.4 08/19/2021   MCV 91.4 08/19/2021   PLT  222 08/19/2021     Chemistry      Component Value Date/Time   NA 137 08/19/2021 0934   NA 137 12/15/2016 1421   K 4.1 08/19/2021 0934   K 4.4 12/15/2016 1421   CL 108 08/19/2021 0934   CO2 22 08/19/2021 0934   CO2 19 (L) 12/15/2016 1421   BUN 13 08/19/2021 0934   BUN 13.4 12/15/2016 1421   CREATININE 0.76 08/19/2021 0934   CREATININE 0.71 06/26/2021 1419   CREATININE 0.8 12/15/2016 1421      Component Value Date/Time   CALCIUM 9.0 08/19/2021 0934   CALCIUM 9.4 12/15/2016 1421   ALKPHOS 101 08/19/2021 0934   ALKPHOS 92 12/15/2016 1421    AST 14 (L) 08/19/2021 0934   AST 17 12/15/2016 1421   ALT 10 08/19/2021 0934   ALT 12 12/15/2016 1421   BILITOT 0.4 08/19/2021 0934   BILITOT 0.37 12/15/2016 1421          Latest Reference Range & Units 08/05/20 11:43 12/09/20 10:52 04/15/21 10:21 08/19/21 09:34  Prostate Specific Ag, Serum 0.0 - 4.0 ng/mL 3.6 3.6 4.1 (H) 3.7  (H): Data is abnormally high      Impression and Plan:   76 year old man with   1.  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   He continues to be on Xtandi with reasonable PSA response that has not minimally changed since 2018.  His PSA continues to be close to 4 and has fluctuated since that time.  Risks and benefits of continuing this treatment long-term were discussed.  Complications that include hypertension and fatigue were reiterated.  He is agreeable to continue.  Alternative treatment options including PARP inhibitor or systemic chemotherapy will be deferred unless he has disease progression   2. Androgen depravation: I recommended continuing this indefinitely.  He receives that every 6 months under the care of Dr. Junious Silk.  3.  Hypertension: Mildly elevated today but within normal range otherwise.  Continue to monitor at future visits.  4. Follow-up: In 4 months for repeat follow-up.  30  minutes were spent on this encounter.  The time was dedicated to reviewing laboratory data, disease status update and outlining future plan of care discussion.  Zola Button, MD 7/12/20232:46 PM

## 2021-08-27 ENCOUNTER — Other Ambulatory Visit (HOSPITAL_COMMUNITY): Payer: Self-pay

## 2021-08-28 ENCOUNTER — Telehealth: Payer: Self-pay | Admitting: Oncology

## 2021-08-28 NOTE — Telephone Encounter (Signed)
Scheduled per 07/12 los, patient has been called and voicemail was left. 

## 2021-11-10 ENCOUNTER — Telehealth: Payer: Self-pay

## 2021-11-10 ENCOUNTER — Ambulatory Visit (INDEPENDENT_AMBULATORY_CARE_PROVIDER_SITE_OTHER): Payer: Medicare HMO | Admitting: Nurse Practitioner

## 2021-11-10 ENCOUNTER — Encounter: Payer: Self-pay | Admitting: Nurse Practitioner

## 2021-11-10 DIAGNOSIS — Z0000 Encounter for general adult medical examination without abnormal findings: Secondary | ICD-10-CM | POA: Diagnosis not present

## 2021-11-10 DIAGNOSIS — E782 Mixed hyperlipidemia: Secondary | ICD-10-CM

## 2021-11-10 MED ORDER — ATORVASTATIN CALCIUM 20 MG PO TABS: 20.0000 mg | ORAL_TABLET | Freq: Every morning | ORAL | 1 refills | Status: DC

## 2021-11-10 NOTE — Telephone Encounter (Signed)
Mr. clifton, safley are scheduled for a virtual visit with your provider today.    Just as we do with appointments in the office, we must obtain your consent to participate.  Your consent will be active for this visit and any virtual visit you may have with one of our providers in the next 365 days.    If you have a MyChart account, I can also send a copy of this consent to you electronically.  All virtual visits are billed to your insurance company just like a traditional visit in the office.  As this is a virtual visit, video technology does not allow for your provider to perform a traditional examination.  This may limit your provider's ability to fully assess your condition.  If your provider identifies any concerns that need to be evaluated in person or the need to arrange testing such as labs, EKG, etc, we will make arrangements to do so.    Although advances in technology are sophisticated, we cannot ensure that it will always work on either your end or our end.  If the connection with a video visit is poor, we may have to switch to a telephone visit.  With either a video or telephone visit, we are not always able to ensure that we have a secure connection.   I need to obtain your verbal consent now.   Are you willing to proceed with your visit today?   Eural Holzschuh has provided verbal consent on 11/10/2021 for a virtual visit (video or telephone).   Leigh Aurora [place], [place] 11/10/2021  8:23 AM

## 2021-11-10 NOTE — Progress Notes (Signed)
   This service is provided via telemedicine  No vital signs collected/recorded due to the encounter was a telemedicine visit.   Location of patient (ex: home, work):  Home  Patient consents to a telephone visit: Yes, see telephone visit dated 11/10/21  Location of the provider (ex: office, home):  [place], [place]   Name of any referring provider:  N/A  Names of all persons participating in the telemedicine service and their role in the encounter:  S.Chrae B/CMA, Sherrie Mustache, NP, and Patient   Time spent on call:  8 min with medical assistant

## 2021-11-10 NOTE — Progress Notes (Signed)
Subjective:   Samuel [name] is a 76 y.o. male who presents for Medicare Annual/Subsequent preventive examination.  Review of Systems     Cardiac Risk Factors include: sedentary lifestyle;advanced age (>29mn, >>78women);obesity (BMI >30kg/m2);diabetes mellitus;dyslipidemia     Objective:    There were no vitals filed for this visit. There is no height or weight on file to calculate BMI.     11/10/2021    8:08 AM 06/26/2021   11:29 AM 05/29/2021   11:01 AM 11/07/2020    9:49 AM 10/31/2020    8:26 AM 08/21/2020    6:03 AM 05/02/2020   10:28 AM  Advanced Directives  Does Patient Have a Medical Advance Directive? No No No No No No No  Does patient want to make changes to medical advance directive? Yes (MAU/Ambulatory/Procedural Areas - Information given)        Would patient like information on creating a medical advance directive?  No - Patient declined No - Patient declined Yes (MAU/Ambulatory/Procedural Areas - Information given) Yes (MAU/Ambulatory/Procedural Areas - Information given) No - Patient declined No - Patient declined    Current Medications (verified) Outpatient Encounter Medications as of 11/10/2021  Medication Sig   aspirin EC 325 MG tablet Take 1 tablet (325 mg total) by mouth daily.   enzalutamide (XTANDI) 40 MG capsule TAKE 4 CAPSULES BY MOUTH ONCE DAILY.   triptorelin (TRELESTAR LA) 11.25 MG injection Inject 11.25 mg into the muscle every 6 (six) months.   [DISCONTINUED] atorvastatin (LIPITOR) 20 MG tablet Take 1 tablet (20 mg total) by mouth every morning.   atorvastatin (LIPITOR) 20 MG tablet Take 1 tablet (20 mg total) by mouth every morning.   [DISCONTINUED] losartan (COZAAR) 25 MG tablet Take 0.5 tablets (12.5 mg total) by mouth daily. (Patient not taking: Reported on 11/10/2021)   No facility-administered encounter medications on file as of 11/10/2021.    Allergies (verified) Other and Peach [prunus persica]   History: Past Medical History:  Diagnosis Date    Arthritis    Blind left eye    Cyst in hand 10/2013   left hand, treated with antibiotic & pain medicine   Diabetes mellitus without complication (HCC)    AG3T7.1% 04/28/20   Heart murmur    Hepatitis A    25 YRS AGO   Hyperlipidemia    Pre-diabetes    Prostate cancer ([facility]    Sleep apnea    ????   O2 DESAT  10/26/13  IN ED , no sleep study ever done per pt- no cpap   Past Surgical History:  Procedure Laterality Date   COLONOSCOPY     EYE SURGERY     LEFT AYE AS CHILD    JOINT REPLACEMENT Bilateral    LT KNEE 3 YRS AGO    MOUTH SURGERY  09/24/2020   MULTIPLE EXTRACTIONS WITH ALVEOLOPLASTY N/A 08/21/2020   Procedure: MULTIPLE EXTRACTION WITH ALVEOLOPLASTY;  Surgeon: OCharlaine Dalton DMD;  [facility]  Service: Dentistry;  Laterality: N/A;   TOTAL KNEE ARTHROPLASTY Right 12/04/2013   dr dRhona Raider  TOTAL KNEE ARTHROPLASTY Right 12/04/2013   Procedure: TOTAL KNEE ARTHROPLASTY;  Surgeon: PHessie Dibble MD;  [facility]  Service: Orthopedics;  Laterality: Right;   Family History  Problem Relation Age of Onset   Stroke Mother    Heart attack Mother    Diabetes Mother    Stroke Father    Stomach cancer Brother    Prostate cancer Brother  Throat cancer Sister    Colon cancer Neg Hx    Esophageal cancer Neg Hx    Rectal cancer Neg Hx    Social History   Socioeconomic History   Marital status: Widowed    Spouse name: Not on file   Number of children: Not on file   Years of education: Not on file   Highest education level: Not on file  Occupational History   Not on file  Tobacco Use   Smoking status: Former    Packs/day: 0.50    Years: 5.00    Total pack years: 2.50    Types: Cigarettes    Quit date: 02/16/1983    Years since quitting: 38.7   Smokeless tobacco: Never  Vaping Use   Vaping Use: Never used  Substance and Sexual Activity   Alcohol use: No    Alcohol/week: 0.0 standard drinks of alcohol   Drug use: No   Sexual activity: Not on file   Other Topics Concern   Not on file  Social History Narrative   DIET: Fruit, veggies, beef, pork, chicken, fish      DO YOU DRINK/EAT THINGS WITH CAFFEINE: yes      MARITAL STATUS: widowed      WHAT YEAR WERE YOU MARRIED:1965      DO YOU LIVE IN A HOUSE, APARTMENT, ASSISTED LIVING, CONDO TRAILER ETC.: house      IS IT ONE OR MORE STORIES: 1      [place]: 2      DO [place]: no      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      DO YOU EXERCISE: yes      WHAT TYPE AND HOW OFTEN: walking 5 days a week   Social Determinants of Health   Financial Resource Strain: Low Risk  (10/14/2017)   Overall Financial Resource Strain (CARDIA)    Difficulty of Paying Living Expenses: Not hard at all  Food Insecurity: No Food Insecurity (10/14/2017)   Hunger Vital Sign    Worried About Running Out of Food in the Last Year: Never true    [place] in the Last Year: Never true  Transportation Needs: No Transportation Needs (10/14/2017)   PRAPARE - Hydrologist (Medical): No    Lack of Transportation (Non-Medical): No  Physical Activity: Insufficiently Active (10/14/2017)   Exercise Vital Sign    Days of Exercise per Week: 3 days    Minutes of Exercise per Session: 10 min  Stress: No Stress Concern Present (10/14/2017)   [place]    Feeling of Stress : Not at all  Social Connections: Somewhat Isolated (10/14/2017)   Social Connection and Isolation Panel [NHANES]    Frequency of Communication with Friends and Family: More than three times a week    Frequency of Social Gatherings with Friends and Family: More than three times a week    Attends Religious Services: More than 4 times per year    Active Member of [employer] or Organizations: No    Attends Archivist Meetings: Never    Marital Status: Widowed    Tobacco  Counseling Counseling given: Not Answered   Clinical Intake:  Pre-visit preparation completed: Yes  Pain : No/denies pain     BMI - recorded: 37 Nutritional Status: BMI > 30  Obese Nutritional Risks: None Diabetes:  Yes  How often do you need to have someone help you when you read instructions, pamphlets, or other written materials from your doctor or pharmacy?: 1 - Never  [place] of Daily Living    11/10/2021    8:46 AM  In your present state of health, do you have any difficulty performing the following activities:  Hearing? 0  Vision? 0  Difficulty concentrating or making decisions? 0  Walking or climbing stairs? 0  Dressing or bathing? 0  Doing errands, shopping? 0  Preparing Food and eating ? N  Using the Toilet? N  In the past six months, have you accidently leaked urine? N  Do you have problems with loss of bowel control? N  Managing your Medications? N  Managing your Finances? N  Housekeeping or managing your Housekeeping? N    Patient Care Team: [name], NP as PCP - General (Geriatric Medicine) Festus Aloe, MD as Consulting Physician (Urology)  Indicate any recent Medical Services you may have received from other than [facility] providers in the past year (date may be approximate).     Assessment:   This is a routine wellness examination for Samuel [name].  Hearing/Vision screen Hearing Screening - Comments:: No hearing issues   Dietary issues and exercise activities discussed: Current Exercise Habits: Home exercise routine, Type of exercise: walking, Time (Minutes): 10, Frequency (Times/Week): 3, Weekly Exercise (Minutes/Week): 30   Goals Addressed   None    Depression Screen    11/10/2021    8:10 AM 05/29/2021   12:50 PM 10/31/2020    8:25 AM 10/30/2019    9:16 AM 01/10/2019    9:13 AM 10/16/2018   11:36 AM 10/14/2017    9:29 AM  PHQ 2/9 Scores  PHQ - 2 Score 0 0 0 0 0 0 0    Fall Risk    11/10/2021    8:10  AM 05/29/2021   12:49 PM 11/07/2020   10:18 AM 10/31/2020    8:25 AM 10/30/2019    9:17 AM  Fall Risk   Falls in the past year? 0 1 1 0 0  Number falls in past yr: 0 0 0 0 0  Injury with Fall? 0 0 0 0 0  Risk for fall due to : No Fall Risks No Fall Risks No Fall Risks No Fall Risks   Follow up Falls evaluation completed Falls evaluation completed Falls evaluation completed Falls evaluation completed     [place]:  Any stairs in or around the home? No  If so, are there any without handrails?  na Home free of loose throw rugs in walkways, pet beds, electrical cords, etc? Yes  Adequate lighting in your home to reduce risk of falls? Yes   ASSISTIVE DEVICES UTILIZED TO PREVENT FALLS:  Life alert? No  Use of a cane, walker or w/c? No  Grab bars in the bathroom? Yes  Shower chair or bench in shower? Yes  Elevated toilet seat or a handicapped toilet? No   TIMED UP AND GO:  Was the test performed? No .    Cognitive Function:    10/14/2017    9:30 AM 10/08/2016   11:27 AM 05/21/2015    2:59 PM  MMSE - Mini Mental State Exam  Orientation to time '5 4 5  '$ Orientation to Place '5 5 5  '$ Registration '3 3 3  '$ Attention/ Calculation '5 5 5  '$ Recall 3  1 1  Language- name 2 objects '2 2 2  '$ Language- repeat '1 1 1  '$ Language- follow 3 step command '3 3 3  '$ Language- read & follow direction '1 1 1  '$ Write a sentence '1 1 1  '$ Copy design '1 1 1  '$ Total score '30 27 28        '$ 11/10/2021    8:20 AM 10/31/2020    8:27 AM 10/30/2019    9:20 AM 10/16/2018   11:37 AM  6CIT Screen  What Year? 0 points 0 points 0 points 0 points  What month? 0 points 0 points 0 points 0 points  What time? 0 points 0 points 0 points 0 points  Count back from 20 2 points 0 points 0 points 0 points  Months in reverse 0 points 2 points 0 points 2 points  Repeat phrase 2 points 2 points 0 points 6 points  Total Score 4 points 4 points 0 points 8 points    Immunizations Immunization History   Administered Date(s) Administered   Moderna Sars-Covid-2 Vaccination 04/16/2019, 05/14/2019, 01/31/2020    TDAP status: Due, Education has been provided regarding the importance of this vaccine. Advised may receive this vaccine at local pharmacy or Health Dept. Aware to provide a copy of the vaccination record if obtained from local pharmacy or Health Dept. Verbalized acceptance and understanding.  Flu Vaccine status: Declined, Education has been provided regarding the importance of this vaccine but patient still declined. Advised may receive this vaccine at local pharmacy or Health Dept. Aware to provide a copy of the vaccination record if obtained from local pharmacy or Health Dept. Verbalized acceptance and understanding.  Pneumococcal vaccine status: Declined,  Education has been provided regarding the importance of this vaccine but patient still declined. Advised may receive this vaccine at local pharmacy or Health Dept. Aware to provide a copy of the vaccination record if obtained from local pharmacy or Health Dept. Verbalized acceptance and understanding.   Covid-19 vaccine status: Information provided on how to obtain vaccines.   Qualifies for Shingles Vaccine? Yes   Zostavax completed No   Shingrix Completed?: No.    Education has been provided regarding the importance of this vaccine. Patient has been advised to call insurance company to determine out of pocket expense if they have not yet received this vaccine. Advised may also receive vaccine at local pharmacy or Health Dept. Verbalized acceptance and understanding.  Screening Tests Health Maintenance  Topic Date Due   TETANUS/TDAP  Never done   OPHTHALMOLOGY EXAM  05/11/2018   COVID-19 Vaccine (4 - Moderna risk series) 03/27/2020   FOOT EXAM  11/07/2021   INFLUENZA VACCINE  02/16/2048 (Originally 09/15/2021)   HEMOGLOBIN A1C  11/28/2021   Diabetic kidney evaluation - Urine ACR  05/30/2022   Diabetic kidney evaluation - GFR  measurement  08/20/2022   Hepatitis C Screening  Completed   HPV VACCINES  Aged Out   Pneumonia Vaccine 29+ Years old  Discontinued   Fecal DNA (Cologuard)  Discontinued   Zoster Vaccines- Shingrix  Discontinued    Health Maintenance  Health Maintenance Due  Topic Date Due   TETANUS/TDAP  Never done   OPHTHALMOLOGY EXAM  05/11/2018   COVID-19 Vaccine (4 - Moderna risk series) 03/27/2020   FOOT EXAM  11/07/2021    Colorectal cancer screening: No longer required.   Lung Cancer Screening: (Low Dose CT Chest recommended if Age 50-80 years, 30 pack-year currently smoking OR have quit w/in 15years.) does not qualify.  Lung Cancer Screening Referral: na  Additional Screening:  Hepatitis C Screening: does qualify; Completed 2020  Vision Screening: Recommended annual ophthalmology exams for early detection of glaucoma and other disorders of the eye. Is the patient up to date with their annual eye exam?  No  Who is the provider or what is the name of the office in which the patient attends annual eye exams? [facility] If pt is not established with a provider, would they like to be referred to a provider to establish care? No .   Dental Screening: Recommended annual dental exams for proper oral hygiene  Community Resource Referral / Chronic Care Management: CRR required this visit?  No   CCM required this visit?  No      Plan:     I have personally reviewed and noted the following in the patient's chart:   Medical and social history Use of alcohol, tobacco or illicit drugs  Current medications and supplements including opioid prescriptions. Patient is not currently taking opioid prescriptions. Functional ability and status Nutritional status Physical activity Advanced directives List of other physicians Hospitalizations, surgeries, and ER visits in previous 12 months Vitals Screenings to include cognitive, depression, and falls Referrals and appointments  In addition, I  have reviewed and discussed with patient certain preventive protocols, quality metrics, and best practice recommendations. A written personalized care plan for preventive services as well as general preventive health recommendations were provided to patient.     [name], NP   11/10/2021    Virtual Visit via Telephone Note  I connected with patient 11/10/21 at  9:00 AM EDT by telephone and verified that I am speaking with the correct person using two identifiers.  Location: Patient: home Provider: [facility]   I discussed the limitations, risks, security and privacy concerns of performing an evaluation and management service by telephone and the availability of in person appointments. I also discussed with the patient that there may be a patient responsible charge related to this service. The patient expressed understanding and agreed to proceed.   I discussed the assessment and treatment plan with the patient. The patient was provided an opportunity to ask questions and all were answered. The patient agreed with the plan and demonstrated an understanding of the instructions.   The patient was advised to call back or seek an in-person evaluation if the symptoms worsen or if the condition fails to improve as anticipated.  I provided 15 minutes of non-face-to-face time during this encounter.  [name]. Harle Battiest [employer] printed and mailed

## 2021-11-10 NOTE — Patient Instructions (Signed)
[name] , Thank you for taking time to come for your Medicare Wellness Visit. I appreciate your ongoing commitment to your health goals. Please review the following plan we discussed and let me know if I can assist you in the future.   Screening recommendations/referrals: Colonoscopy aged out Recommended yearly ophthalmology/optometry visit for glaucoma screening and checkup Recommended yearly dental visit for hygiene and checkup  Vaccinations: Influenza vaccine due annually in September/October Pneumococcal vaccine recommended after 65 Tdap vaccine recommended every 10 years Shingles vaccine recommended after 50 to prevent shingles which can be very painful    Advanced directives: recommended to complete.   Conditions/risks identified: advanced age, sedentary lifestyle, hyperlipidemia.   Next appointment: yearly- next in person.   Preventive Care 80 Years and Older, Male Preventive care refers to lifestyle choices and visits with your health care provider that can promote health and wellness. What does preventive care include? A yearly physical exam. This is also called an annual well check. Dental exams once or twice a year. Routine eye exams. Ask your health care provider how often you should have your eyes checked. Personal lifestyle choices, including: Daily care of your teeth and gums. Regular physical activity. Eating a healthy diet. Avoiding tobacco and drug use. Limiting alcohol use. Practicing safe sex. Taking low doses of aspirin every day. Taking vitamin and mineral supplements as recommended by your health care provider. What happens during an annual well check? The services and screenings done by your health care provider during your annual well check will depend on your age, overall health, lifestyle risk factors, and family history of disease. Counseling  Your health care provider may ask you questions about your: Alcohol use. Tobacco use. Drug  use. Emotional well-being. Home and relationship well-being. Sexual activity. Eating habits. History of falls. Memory and ability to understand (cognition). Work and work Statistician. Screening  You may have the following tests or measurements: Height, weight, and BMI. Blood pressure. Lipid and cholesterol levels. These may be checked every 5 years, or more frequently if you are over 32 years old. Skin check. Lung cancer screening. You may have this screening every year starting at age 66 if you have a 30-pack-year history of smoking and currently smoke or have quit within the past 15 years. Fecal occult blood test (FOBT) of the stool. You may have this test every year starting at age 78. Flexible sigmoidoscopy or colonoscopy. You may have a sigmoidoscopy every 5 years or a colonoscopy every 10 years starting at age 57. Prostate cancer screening. Recommendations will vary depending on your family history and other risks. Hepatitis C blood test. Hepatitis B blood test. Sexually transmitted disease (STD) testing. Diabetes screening. This is done by checking your blood sugar (glucose) after you have not eaten for a while (fasting). You may have this done every 1-3 years. Abdominal aortic aneurysm (AAA) screening. You may need this if you are a current or former smoker. Osteoporosis. You may be screened starting at age 43 if you are at high risk. Talk with your health care provider about your test results, treatment options, and if necessary, the need for more tests. Vaccines  Your health care provider may recommend certain vaccines, such as: Influenza vaccine. This is recommended every year. Tetanus, diphtheria, and acellular pertussis (Tdap, Td) vaccine. You may need a Td booster every 10 years. Zoster vaccine. You may need this after age 3. Pneumococcal 13-valent conjugate (PCV13) vaccine. One dose is recommended after age 73. Pneumococcal polysaccharide (PPSV23) vaccine.  One dose is  recommended after age 36. Talk to your health care provider about which screenings and vaccines you need and how often you need them. This information is not intended to replace advice given to you by your health care provider. Make sure you discuss any questions you have with your health care provider. Document Released: 02/28/2015 Document Revised: 10/22/2015 Document Reviewed: 12/03/2014 Elsevier Interactive Patient Education  2017 [place] Prevention in the Home Falls can cause injuries. They can happen to people of all ages. There are many things you can do to make your home safe and to help prevent falls. What can I do on the outside of my home? Regularly fix the edges of walkways and driveways and fix any cracks. Remove anything that might make you trip as you walk through a door, such as a raised step or threshold. Trim any bushes or trees on the path to your home. Use bright outdoor lighting. Clear any walking paths of anything that might make someone trip, such as rocks or tools. Regularly check to see if handrails are loose or broken. Make sure that both sides of any steps have handrails. Any raised decks and porches should have guardrails on the edges. Have any leaves, snow, or ice cleared regularly. Use sand or salt on walking paths during winter. Clean up any spills in your garage right away. This includes oil or grease spills. What can I do in the bathroom? Use night lights. Install grab bars by the toilet and in the tub and shower. Do not use towel bars as grab bars. Use non-skid mats or decals in the tub or shower. If you need to sit down in the shower, use a plastic, non-slip stool. Keep the floor dry. Clean up any water that spills on the floor as soon as it happens. Remove soap buildup in the tub or shower regularly. Attach bath mats securely with double-sided non-slip rug tape. Do not have throw rugs and other things on the floor that can make you  trip. What can I do in the bedroom? Use night lights. Make sure that you have a light by your bed that is easy to reach. Do not use any sheets or blankets that are too big for your bed. They should not hang down onto the floor. Have a firm chair that has side arms. You can use this for support while you get dressed. Do not have throw rugs and other things on the floor that can make you trip. What can I do in the kitchen? Clean up any spills right away. Avoid walking on wet floors. Keep items that you use a lot in easy-to-reach places. If you need to reach something above you, use a strong step stool that has a grab bar. Keep electrical cords out of the way. Do not use floor polish or wax that makes floors slippery. If you must use wax, use non-skid floor wax. Do not have throw rugs and other things on the floor that can make you trip. What can I do with my stairs? Do not leave any items on the stairs. Make sure that there are handrails on both sides of the stairs and use them. Fix handrails that are broken or loose. Make sure that handrails are as long as the stairways. Check any carpeting to make sure that it is firmly attached to the stairs. Fix any carpet that is loose or worn. Avoid having throw rugs at the top or bottom of  the stairs. If you do have throw rugs, attach them to the floor with carpet tape. Make sure that you have a light switch at the top of the stairs and the bottom of the stairs. If you do not have them, ask someone to add them for you. What else can I do to help prevent falls? Wear shoes that: Do not have high heels. Have rubber bottoms. Are comfortable and fit you well. Are closed at the toe. Do not wear sandals. If you use a stepladder: Make sure that it is fully opened. Do not climb a closed stepladder. Make sure that both sides of the stepladder are locked into place. Ask someone to hold it for you, if possible. Clearly mark and make sure that you can  see: Any grab bars or handrails. First and last steps. Where the edge of each step is. Use tools that help you move around (mobility aids) if they are needed. These include: Canes. Walkers. Scooters. Crutches. Turn on the lights when you go into a dark area. Replace any light bulbs as soon as they burn out. Set up your furniture so you have a clear path. Avoid moving your furniture around. If any of your floors are uneven, fix them. If there are any pets around you, be aware of where they are. Review your medicines with your doctor. Some medicines can make you feel dizzy. This can increase your chance of falling. Ask your doctor what other things that you can do to help prevent falls. This information is not intended to replace advice given to you by your health care provider. Make sure you discuss any questions you have with your health care provider. Document Released: 11/28/2008 Document Revised: 07/10/2015 Document Reviewed: 03/08/2014 Elsevier Interactive Patient Education  2017 [name].

## 2021-11-13 ENCOUNTER — Encounter: Payer: Medicare HMO | Admitting: Nurse Practitioner

## 2021-11-17 ENCOUNTER — Encounter: Payer: Medicare HMO | Admitting: Nurse Practitioner

## 2021-11-19 ENCOUNTER — Other Ambulatory Visit: Payer: Self-pay | Admitting: Oncology

## 2021-11-19 DIAGNOSIS — C61 Malignant neoplasm of prostate: Secondary | ICD-10-CM

## 2021-11-25 ENCOUNTER — Encounter: Payer: Self-pay | Admitting: Nurse Practitioner

## 2021-11-27 ENCOUNTER — Ambulatory Visit
Admission: RE | Admit: 2021-11-27 | Discharge: 2021-11-27 | Disposition: A | Discharge status: Home / Self Care | Payer: Medicare HMO | Source: Ambulatory Visit | Attending: Nurse Practitioner | Admitting: Nurse Practitioner

## 2021-11-27 ENCOUNTER — Ambulatory Visit (INDEPENDENT_AMBULATORY_CARE_PROVIDER_SITE_OTHER): Payer: Medicare HMO | Admitting: Nurse Practitioner

## 2021-11-27 ENCOUNTER — Encounter: Payer: Self-pay | Admitting: Nurse Practitioner

## 2021-11-27 VITALS — BP 142/80 | HR 62 | Temp 97.1°F | Ht 69.0 in | Wt 250.0 lb

## 2021-11-27 DIAGNOSIS — R053 Chronic cough: Secondary | ICD-10-CM

## 2021-11-27 DIAGNOSIS — E1141 Type 2 diabetes mellitus with diabetic mononeuropathy: Secondary | ICD-10-CM | POA: Diagnosis not present

## 2021-11-27 DIAGNOSIS — E782 Mixed hyperlipidemia: Secondary | ICD-10-CM | POA: Diagnosis not present

## 2021-11-27 DIAGNOSIS — I10 Essential (primary) hypertension: Secondary | ICD-10-CM | POA: Diagnosis not present

## 2021-11-27 DIAGNOSIS — C61 Malignant neoplasm of prostate: Secondary | ICD-10-CM | POA: Diagnosis not present

## 2021-11-27 DIAGNOSIS — R059 Cough, unspecified: Secondary | ICD-10-CM | POA: Diagnosis not present

## 2021-11-27 MED ORDER — LOSARTAN POTASSIUM 25 MG PO TABS: 25.0000 mg | ORAL_TABLET | Freq: Every day | ORAL | 0 refills | Status: DC

## 2021-11-27 NOTE — Progress Notes (Signed)
Careteam: Patient Care Team: [name], NP as PCP - General (Geriatric Medicine) Festus Aloe, MD as Consulting Physician (Urology)  PLACE OF SERVICE:  [facility] Directive information Does Patient Have a Medical Advance Directive?: No, Does patient want to make changes to medical advance directive?: Yes (MAU/Ambulatory/Procedural Areas - Information given) (Given at a previous visit)  Allergies  Allergen Reactions   Other Other (See Comments)    Patient stated,"if I touch felt (material) I get "knots" on my body."   Peach [Prunus Persica] Other (See Comments)    Patient stated,"if I touch or eat peach fuzz, I get "knots" all over my face and mouth."     Chief Complaint  Patient presents with   Medical Management of Chronic Issues    5 month follow-up and foot exam. Discuss need for td/tdap and covid boosters or post pone if patient refuses. [facility] verified     HPI: Patient is a 76 y.o. male for routine follow up.   He reports prescription for losartan was never sent in. He stopped lisinopril but cough did not change. Continues to have same cough. No shortness of breath or wheezing noted. Clear sputum when he has a coughing spell. This happens once daily, mostly at night. 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   Htn- elevated today, no headaches, blurred vision, no dizziness  Reports he is walking more.   Continue to follow up with urologist due to hx of prostate cancer Review of Systems:  Review of Systems  Constitutional:  Negative for chills, fever and weight loss.  HENT:  Negative for tinnitus.   Respiratory:  Negative for cough, sputum production and shortness of breath.   Cardiovascular:  Negative for chest pain, palpitations and leg swelling.  Gastrointestinal:  Negative for abdominal pain, constipation, diarrhea and heartburn.  Genitourinary:  Negative for dysuria, frequency and urgency.  Musculoskeletal:  Negative for  back pain, falls, joint pain and myalgias.  Skin: Negative.   Neurological:  Negative for dizziness and headaches.  Psychiatric/Behavioral:  Negative for depression and memory loss. The patient does not have insomnia.    Past Medical History:  Diagnosis Date   Arthritis    Blind left eye    Cyst in hand 10/2013   left hand, treated with antibiotic & pain medicine   Diabetes mellitus without complication (HCC)    J0D 7.1% 04/28/20   Heart murmur    Hepatitis A    25 YRS AGO   Hyperlipidemia    Pre-diabetes    Prostate cancer ([place])    Sleep apnea    ????   O2 DESAT  10/26/13  IN ED , no sleep study ever done per pt- no cpap   Past Surgical History:  Procedure Laterality Date   COLONOSCOPY     EYE SURGERY     LEFT AYE AS CHILD    JOINT REPLACEMENT Bilateral    LT KNEE 3 YRS AGO    MOUTH SURGERY  09/24/2020   MULTIPLE EXTRACTIONS WITH ALVEOLOPLASTY N/A 08/21/2020   Procedure: MULTIPLE EXTRACTION WITH ALVEOLOPLASTY;  Surgeon: Charlaine Dalton, DMD;  Location: [facility];  Service: Dentistry;  Laterality: N/A;   TOTAL KNEE ARTHROPLASTY Right 12/04/2013   dr Rhona Raider   TOTAL KNEE ARTHROPLASTY Right 12/04/2013   Procedure: TOTAL KNEE ARTHROPLASTY;  Surgeon: Hessie Dibble, MD;  Location: [facility];  Service: Orthopedics;  Laterality: Right;   Social History:   reports that  he quit smoking about 38 years ago. His smoking use included cigarettes. He has a 2.50 pack-year smoking history. He has never used smokeless tobacco. He reports that he does not drink alcohol and does not use drugs.  Family History  Problem Relation Age of Onset   Stroke Mother    Heart attack Mother    Diabetes Mother    Stroke Father    Stomach cancer Brother    Prostate cancer Brother    Throat cancer Sister    Colon cancer Neg Hx    Esophageal cancer Neg Hx    Rectal cancer Neg Hx     Medications: Patient's Medications  New Prescriptions   No medications on file  Previous Medications   ASPIRIN EC 325  MG TABLET    Take 1 tablet (325 mg total) by mouth daily.   ATORVASTATIN (LIPITOR) 20 MG TABLET    Take 1 tablet (20 mg total) by mouth every morning.   TRIPTORELIN (TRELESTAR LA) 11.25 MG INJECTION    Inject 11.25 mg into the muscle every 6 (six) months.   XTANDI 40 MG TABLET    Take 4 tablets (127m) by mouth once daily as directed by physician.  Modified Medications   No medications on file  Discontinued Medications   No medications on file    Physical Exam:  Vitals:   11/27/21 0806  BP: (!) 142/80  Pulse: 62  Temp: (!) 97.1 F (36.2 C)  TempSrc: Temporal  SpO2: 97%  Weight: 250 lb (113.4 kg)  Height: '5\' 9"'  (1.753 m)   Body mass index is 36.92 kg/m. Wt Readings from Last 3 Encounters:  11/27/21 250 lb (113.4 kg)  08/26/21 255 lb 4.8 oz (115.8 kg)  06/26/21 251 lb (113.9 kg)    Physical Exam Constitutional:      General: He is not in acute distress.    Appearance: He is well-developed. He is not diaphoretic.  HENT:     Head: Normocephalic and atraumatic.     Right Ear: External ear normal.     Left Ear: External ear normal.     Mouth/Throat:     Pharynx: No oropharyngeal exudate.  Eyes:     Conjunctiva/sclera: Conjunctivae normal.     Pupils: Pupils are equal, round, and reactive to light.  Cardiovascular:     Rate and Rhythm: Normal rate and regular rhythm.     Heart sounds: Normal heart sounds.  Pulmonary:     Effort: Pulmonary effort is normal.     Breath sounds: Normal breath sounds.  Abdominal:     General: Bowel sounds are normal.     Palpations: Abdomen is soft.  Musculoskeletal:        General: No tenderness.     Cervical back: Normal range of motion and neck supple.     Right lower leg: No edema.     Left lower leg: No edema.  Skin:    General: Skin is warm and dry.  Neurological:     Mental Status: He is alert and oriented to person, place, and time.     Labs reviewed: Basic Metabolic Panel: Recent Labs    04/15/21 1021 06/26/21 1419  08/19/21 0934  NA 136 138 137  K 4.0 4.4 4.1  CL 105 107 108  CO2 19* 21 22  GLUCOSE 169* 105 151*  BUN '17 14 13  ' CREATININE 0.97 0.71 0.76  CALCIUM 9.7 8.9 9.0   Liver Function Tests: Recent Labs    12/09/20  1052 04/15/21 1021 08/19/21 0934  AST 16 16 14*  ALT '15 15 10  ' ALKPHOS 100 112 101  BILITOT 0.8 0.5 0.4  PROT 7.5 7.7 7.2  ALBUMIN 3.9 4.3 4.0   No results for input(s): "LIPASE", "AMYLASE" in the last 8760 hours. No results for input(s): "AMMONIA" in the last 8760 hours. CBC: Recent Labs    12/09/20 1052 04/15/21 1021 08/19/21 0934  WBC 5.9 5.6 5.4  NEUTROABS 2.1 2.7 2.6  HGB 14.0 14.5 14.0  HCT 40.6 43.2 40.4  MCV 91.0 92.5 91.4  PLT 226 235 222   Lipid Panel: No results for input(s): "CHOL", "HDL", "LDLCALC", "TRIG", "CHOLHDL", "LDLDIRECT" in the last 8760 hours. TSH: No results for input(s): "TSH" in the last 8760 hours. A1C: Lab Results  Component Value Date   HGBA1C 6.6 (H) 05/29/2021     Assessment/Plan 1. Essential hypertension -will add losartan to regimen due to elevated blood pressure -dietary modifications discussed - CMP with eGFR([facility]) - CBC with Differential/Platelet - losartan (COZAAR) 25 MG tablet; Take 1 tablet (25 mg total) by mouth daily.  Dispense: 90 tablet; Refill: 0  2. Mixed hyperlipidemia -continues on lipitor with dietary modifications - Lipid panel - CMP with eGFR([facility])  3. Type 2 diabetes mellitus with diabetic mononeuropathy, without long-term current use of insulin (HCC) -diet controlled, encouraged dietary compliance, routine foot care/monitoring and to keep up with diabetic eye exams through ophthalmology  - Hemoglobin A1c  4. Prostate cancer ([place]) Continues to follow up with urology, no change in symptoms.   5. Chronic cough -ongoing, will have him follow up chest xray at this time.    Return in about 4 weeks (around 12/25/2021) for blood pressure. [name]. [place], [place]  Adult [facility] [phone]

## 2021-11-27 NOTE — Patient Instructions (Signed)
[facility] [place]  [phone]  [facility]  [phone]

## 2021-11-28 LAB — CBC WITH DIFFERENTIAL/PLATELET
Absolute Monocytes: 510 cells/uL (ref 200–950)
Basophils Absolute: 31 cells/uL (ref 0–200)
Basophils Relative: 0.6 %
Eosinophils Absolute: 31 cells/uL (ref 15–500)
Eosinophils Relative: 0.6 %
HCT: 40.5 % (ref 38.5–50.0)
Hemoglobin: 13.6 g/dL (ref 13.2–17.1)
Lymphs Abs: 2106 cells/uL (ref 850–3900)
MCH: 31.4 pg (ref 27.0–33.0)
MCHC: 33.6 g/dL (ref 32.0–36.0)
MCV: 93.5 fL (ref 80.0–100.0)
MPV: 11 fL (ref 7.5–12.5)
Monocytes Relative: 10 %
Neutro Abs: 2423 cells/uL (ref 1500–7800)
Neutrophils Relative %: 47.5 %
Platelets: 235 10*3/uL (ref 140–400)
RBC: 4.33 10*6/uL (ref 4.20–5.80)
RDW: 13 % (ref 11.0–15.0)
Total Lymphocyte: 41.3 %
WBC: 5.1 10*3/uL (ref 3.8–10.8)

## 2021-11-28 LAB — COMPLETE METABOLIC PANEL WITH GFR
AG Ratio: 1.5 (calc) (ref 1.0–2.5)
ALT: 11 U/L (ref 9–46)
AST: 16 U/L (ref 10–35)
Albumin: 4.1 g/dL (ref 3.6–5.1)
Alkaline phosphatase (APISO): 94 U/L (ref 35–144)
BUN: 16 mg/dL (ref 7–25)
CO2: 21 mmol/L (ref 20–32)
Calcium: 9.2 mg/dL (ref 8.6–10.3)
Chloride: 108 mmol/L (ref 98–110)
Creat: 0.71 mg/dL (ref 0.70–1.28)
Globulin: 2.7 g/dL (calc) (ref 1.9–3.7)
Glucose, Bld: 120 mg/dL (ref 65–139)
Potassium: 4.3 mmol/L (ref 3.5–5.3)
Sodium: 139 mmol/L (ref 135–146)
Total Bilirubin: 0.4 mg/dL (ref 0.2–1.2)
Total Protein: 6.8 g/dL (ref 6.1–8.1)
eGFR: 94 mL/min/{1.73_m2} (ref >=60)

## 2021-11-28 LAB — HEMOGLOBIN A1C
Hgb A1c MFr Bld: 6.7 % of total Hgb — ABNORMAL HIGH (ref <5.7)
Mean Plasma Glucose: 146 mg/dL
eAG (mmol/L): 8.1 mmol/L

## 2021-11-28 LAB — LIPID PANEL
Cholesterol: 178 mg/dL (ref <200)
HDL: 82 mg/dL (ref >=40)
LDL Cholesterol (Calc): 82 mg/dL (calc)
Non-HDL Cholesterol (Calc): 96 mg/dL (calc) (ref <130)
Total CHOL/HDL Ratio: 2.2 (calc) (ref <5.0)
Triglycerides: 63 mg/dL (ref <150)

## 2021-12-18 ENCOUNTER — Telehealth: Payer: Self-pay | Admitting: Pharmacy Technician

## 2021-12-18 NOTE — Telephone Encounter (Signed)
Oral Oncology Patient Advocate Encounter  Called patient to complete LIS application.   Application completed with patient over the phone.  Status is pending determination.   I will continue to follow until final determination.  Lady Deutscher, CPhT-Adv Oncology Pharmacy Patient [place] Direct Number: [phone]  Fax: [phone]

## 2021-12-18 NOTE — Telephone Encounter (Signed)
Oral Oncology Patient Advocate Encounter   Received notification that patient is due for re-enrollment for assistance for Xtandi through [name].   Re-enrollment requires patient to complete the LIS Medicare questionnaire prior to determination.   Re-enrollment key: [employer] phone number [phone].   I will continue to follow until final determination.   Lady Deutscher, CPhT-Adv Oncology Pharmacy Patient [place] Direct Number: [phone]  Fax: [phone]

## 2021-12-25 ENCOUNTER — Encounter: Payer: Medicare HMO | Admitting: Nurse Practitioner

## 2021-12-29 NOTE — Progress Notes (Signed)
This encounter was created in error - please disregard.

## 2021-12-30 ENCOUNTER — Inpatient Hospital Stay: Payer: Medicare HMO | Attending: Oncology

## 2021-12-30 ENCOUNTER — Other Ambulatory Visit: Payer: Self-pay

## 2021-12-30 DIAGNOSIS — C61 Malignant neoplasm of prostate: Secondary | ICD-10-CM | POA: Insufficient documentation

## 2021-12-30 DIAGNOSIS — I10 Essential (primary) hypertension: Secondary | ICD-10-CM | POA: Insufficient documentation

## 2021-12-30 LAB — CMP (CANCER CENTER ONLY)
ALT: 11 U/L (ref 0–44)
AST: 14 U/L — ABNORMAL LOW (ref 15–41)
Albumin: 4.3 g/dL (ref 3.5–5.0)
Alkaline Phosphatase: 85 U/L (ref 38–126)
Anion gap: 9 (ref 5–15)
BUN: 17 mg/dL (ref 8–23)
CO2: 21 mmol/L — ABNORMAL LOW (ref 22–32)
Calcium: 9.7 mg/dL (ref 8.9–10.3)
Chloride: 107 mmol/L (ref 98–111)
Creatinine: 0.85 mg/dL (ref 0.61–1.24)
GFR, Estimated: >60 mL/min (ref >60)
Glucose, Bld: 110 mg/dL — ABNORMAL HIGH (ref 70–99)
Potassium: 4.1 mmol/L (ref 3.5–5.1)
Sodium: 137 mmol/L (ref 135–145)
Total Bilirubin: 0.5 mg/dL (ref 0.3–1.2)
Total Protein: 7.5 g/dL (ref 6.5–8.1)

## 2021-12-30 LAB — CBC WITH DIFFERENTIAL (CANCER CENTER ONLY)
Abs Immature Granulocytes: 0.02 10*3/uL (ref 0.00–0.07)
Basophils Absolute: 0 10*3/uL (ref 0.0–0.1)
Basophils Relative: 1 %
Eosinophils Absolute: 0.1 10*3/uL (ref 0.0–0.5)
Eosinophils Relative: 2 %
HCT: 40.2 % (ref 39.0–52.0)
Hemoglobin: 14 g/dL (ref 13.0–17.0)
Immature Granulocytes: 0 %
Lymphocytes Relative: 46 %
Lymphs Abs: 2.6 10*3/uL (ref 0.7–4.0)
MCH: 31.9 pg (ref 26.0–34.0)
MCHC: 34.8 g/dL (ref 30.0–36.0)
MCV: 91.6 fL (ref 80.0–100.0)
Monocytes Absolute: 0.5 10*3/uL (ref 0.1–1.0)
Monocytes Relative: 9 %
Neutro Abs: 2.3 10*3/uL (ref 1.7–7.7)
Neutrophils Relative %: 42 %
Platelet Count: 244 10*3/uL (ref 150–400)
RBC: 4.39 MIL/uL (ref 4.22–5.81)
RDW: 13.1 % (ref 11.5–15.5)
WBC Count: 5.5 10*3/uL (ref 4.0–10.5)
nRBC: 0 % (ref 0.0–0.2)

## 2022-01-01 LAB — PROSTATE-SPECIFIC AG, SERUM (LABCORP): Prostate Specific Ag, Serum: 3.9 ng/mL (ref 0.0–4.0)

## 2022-01-06 ENCOUNTER — Other Ambulatory Visit: Payer: Self-pay

## 2022-01-06 ENCOUNTER — Inpatient Hospital Stay (HOSPITAL_BASED_OUTPATIENT_CLINIC_OR_DEPARTMENT_OTHER): Payer: Medicare HMO | Admitting: Oncology

## 2022-01-06 VITALS — BP 164/82 | HR 78 | Temp 98.1°F | Resp 19 | Ht 69.0 in | Wt 247.5 lb

## 2022-01-06 DIAGNOSIS — C61 Malignant neoplasm of prostate: Secondary | ICD-10-CM | POA: Diagnosis not present

## 2022-01-06 DIAGNOSIS — I10 Essential (primary) hypertension: Secondary | ICD-10-CM | POA: Diagnosis not present

## 2022-01-06 NOTE — Progress Notes (Signed)
Hematology and Oncology Follow Up Visit  Samuel [name] [id number] 03-15-1945 76 y.o. 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  Initially presented with localized disease in 2010.  Prior Therapy: He was treated with external beam radiation and androgen deprivation in 2010. He developed biochemical relapse with a PSA rise and he has been on combined androgen deprivation with [place] agonist on bicalutamide. PSA in June 2016 was 6.11, in December 2016 was 10, in June 2017 was 9 and in September 2017 was up to 12.  Staging workup including CT scan of the abdomen and pelvis as well as bone scan showed no evidence of metastatic disease.  His PSA was up to 22.5 in April 2018.   Current therapy:   Androgen Deprivation given under the care of Dr. Junious Silk.   Xtandi 160 mg daily started in May 2018.  Interim History: Mr. Horrigan returns today for repeat evaluation.  Since last visit, he reports no major changes in his health.  He continues to report no side effects related to [place].  He denies any nausea, vomiting or abdominal pain.  He denies any hospitalizations or illnesses.  His blood pressure remains mildly elevated between visits but overall under control.             Medications: Dated on review. Current Outpatient Medications  Medication Sig Dispense Refill   aspirin EC 325 MG tablet Take 1 tablet (325 mg total) by mouth daily. 30 tablet 0   atorvastatin (LIPITOR) 20 MG tablet Take 1 tablet (20 mg total) by mouth every morning. 90 tablet 1   losartan (COZAAR) 25 MG tablet Take 1 tablet (25 mg total) by mouth daily. 90 tablet 0   triptorelin (TRELESTAR LA) 11.25 MG injection Inject 11.25 mg into the muscle every 6 (six) months.     XTANDI 40 MG tablet Take 4 tablets ('160mg'$ ) by mouth once daily as directed by physician. 120 tablet 1   No  current facility-administered medications for this visit.     Allergies:  Allergies  Allergen Reactions   Other Other (See Comments)    Patient stated,"if I touch felt (material) I get "knots" on my body."   Peach [Prunus Persica] Other (See Comments)    Patient stated,"if I touch or eat peach fuzz, I get "knots" all over my face and mouth."        Physical Exam:             ECOG: 1  .' General appearance: Comfortable appearing without any discomfort Head: Normocephalic without any trauma Oropharynx: Mucous membranes are moist and pink without any thrush or ulcers. Eyes: Pupils are equal and round reactive to light. Lymph nodes: No cervical, supraclavicular, inguinal or axillary lymphadenopathy.   Heart:regular rate and rhythm.  S1 and S2 without leg edema. Lung: Clear without any rhonchi or wheezes.  No dullness to percussion. Abdomin: Soft, nontender, nondistended with good bowel sounds.  No hepatosplenomegaly. Musculoskeletal: No joint deformity or effusion.  Full range of motion noted. Neurological: No deficits noted on motor, sensory and deep tendon reflex exam. Skin: No petechial rash or dryness.  Appeared moist.                Lab Results: Lab Results  Component Value Date   WBC 5.5 12/30/2021   HGB 14.0 12/30/2021   HCT 40.2 12/30/2021   MCV 91.6 12/30/2021   PLT 244  12/30/2021     Chemistry      Component Value Date/Time   NA 137 12/30/2021 1210   NA 137 12/15/2016 1421   K 4.1 12/30/2021 1210   K 4.4 12/15/2016 1421   CL 107 12/30/2021 1210   CO2 21 (L) 12/30/2021 1210   CO2 19 (L) 12/15/2016 1421   BUN 17 12/30/2021 1210   BUN 13.4 12/15/2016 1421   CREATININE 0.85 12/30/2021 1210   CREATININE 0.71 11/27/2021 0901   CREATININE 0.8 12/15/2016 1421      Component Value Date/Time   CALCIUM 9.7 12/30/2021 1210   CALCIUM 9.4 12/15/2016 1421   ALKPHOS 85 12/30/2021 1210   ALKPHOS 92 12/15/2016 1421   AST 14 (L) 12/30/2021  1210   AST 17 12/15/2016 1421   ALT 11 12/30/2021 1210   ALT 12 12/15/2016 1421   BILITOT 0.5 12/30/2021 1210   BILITOT 0.37 12/15/2016 1421         Latest Reference Range & Units 04/15/21 10:21 08/19/21 09:34 12/30/21 12:10  Prostate Specific Ag, Serum 0.0 - 4.0 ng/mL 4.1 (H) 3.7 3.9  (H): Data is abnormally high      Impression and Plan:   76 year old man with   1.  Prostate cancer diagnosed in 2010.  He developed relapsed disease and currently has castration-resistant with biochemical relapse.   He continues to tolerate Xtandi with PSA that remains under reasonable control.  Risks and benefits of continuing this treatment were discussed at this time.  Potential complications include nausea, fatigue and hypertension were reiterated.  Given his excellent PSA control and recommended no additional changes.  Different salvage therapy options including Taxotere chemotherapy, Pluvicto or PARP inhibitor could be utilized.  2. Androgen depravation: This to be continued indefinitely.  He is receiving that under the care of Dr. Junious Silk.  3.  Hypertension: Overall under control but mildly elevated between visits.  I recommended continued observation.  4. Follow-up: He will return in 4 months for a follow-up.  30  minutes were dedicated to this visit.  The time was spent on updating disease status, treatment choices and outlining future plan of care reviewed. Zola Button, MD 11/22/20239:11 AM

## 2022-01-13 ENCOUNTER — Other Ambulatory Visit: Payer: Self-pay | Admitting: Oncology

## 2022-01-13 ENCOUNTER — Other Ambulatory Visit (HOSPITAL_COMMUNITY): Payer: Self-pay

## 2022-01-13 DIAGNOSIS — C61 Malignant neoplasm of prostate: Secondary | ICD-10-CM

## 2022-01-27 ENCOUNTER — Emergency Department (HOSPITAL_COMMUNITY): Payer: Medicare HMO

## 2022-01-27 ENCOUNTER — Other Ambulatory Visit: Payer: Self-pay

## 2022-01-27 ENCOUNTER — Observation Stay (HOSPITAL_COMMUNITY)
Admission: EM | Admit: 2022-01-27 | Discharge: 2022-01-29 | Disposition: A | Discharge status: Home / Self Care | Payer: Medicare HMO | Attending: Internal Medicine | Admitting: Internal Medicine

## 2022-01-27 ENCOUNTER — Encounter (HOSPITAL_COMMUNITY): Payer: Self-pay | Admitting: *Deleted

## 2022-01-27 DIAGNOSIS — W01198A Fall on same level from slipping, tripping and stumbling with subsequent striking against other object, initial encounter: Secondary | ICD-10-CM | POA: Insufficient documentation

## 2022-01-27 DIAGNOSIS — C61 Malignant neoplasm of prostate: Secondary | ICD-10-CM | POA: Diagnosis present

## 2022-01-27 DIAGNOSIS — I771 Stricture of artery: Secondary | ICD-10-CM | POA: Diagnosis not present

## 2022-01-27 DIAGNOSIS — Y92009 Unspecified place in unspecified non-institutional (private) residence as the place of occurrence of the external cause: Secondary | ICD-10-CM

## 2022-01-27 DIAGNOSIS — E872 Acidosis, unspecified: Secondary | ICD-10-CM | POA: Diagnosis present

## 2022-01-27 DIAGNOSIS — Z8546 Personal history of malignant neoplasm of prostate: Secondary | ICD-10-CM | POA: Insufficient documentation

## 2022-01-27 DIAGNOSIS — W19XXXA Unspecified fall, initial encounter: Secondary | ICD-10-CM

## 2022-01-27 DIAGNOSIS — E119 Type 2 diabetes mellitus without complications: Secondary | ICD-10-CM | POA: Insufficient documentation

## 2022-01-27 DIAGNOSIS — S14125A Central cord syndrome at C5 level of cervical spinal cord, initial encounter: Principal | ICD-10-CM | POA: Insufficient documentation

## 2022-01-27 DIAGNOSIS — I160 Hypertensive urgency: Secondary | ICD-10-CM | POA: Diagnosis not present

## 2022-01-27 DIAGNOSIS — G9520 Unspecified cord compression: Secondary | ICD-10-CM | POA: Diagnosis not present

## 2022-01-27 DIAGNOSIS — Z7982 Long term (current) use of aspirin: Secondary | ICD-10-CM | POA: Insufficient documentation

## 2022-01-27 DIAGNOSIS — E782 Mixed hyperlipidemia: Secondary | ICD-10-CM | POA: Diagnosis present

## 2022-01-27 DIAGNOSIS — Z87891 Personal history of nicotine dependence: Secondary | ICD-10-CM | POA: Diagnosis not present

## 2022-01-27 DIAGNOSIS — T1490XA Injury, unspecified, initial encounter: Secondary | ICD-10-CM | POA: Diagnosis not present

## 2022-01-27 DIAGNOSIS — Z79899 Other long term (current) drug therapy: Secondary | ICD-10-CM | POA: Diagnosis not present

## 2022-01-27 DIAGNOSIS — R202 Paresthesia of skin: Secondary | ICD-10-CM | POA: Diagnosis not present

## 2022-01-27 DIAGNOSIS — Z96653 Presence of artificial knee joint, bilateral: Secondary | ICD-10-CM | POA: Insufficient documentation

## 2022-01-27 DIAGNOSIS — R079 Chest pain, unspecified: Secondary | ICD-10-CM | POA: Diagnosis present

## 2022-01-27 DIAGNOSIS — R0789 Other chest pain: Secondary | ICD-10-CM | POA: Diagnosis not present

## 2022-01-27 DIAGNOSIS — M4802 Spinal stenosis, cervical region: Secondary | ICD-10-CM | POA: Diagnosis not present

## 2022-01-27 DIAGNOSIS — R0781 Pleurodynia: Secondary | ICD-10-CM | POA: Diagnosis not present

## 2022-01-27 DIAGNOSIS — I700 Atherosclerosis of aorta: Secondary | ICD-10-CM | POA: Diagnosis not present

## 2022-01-27 DIAGNOSIS — W010XXA Fall on same level from slipping, tripping and stumbling without subsequent striking against object, initial encounter: Secondary | ICD-10-CM | POA: Insufficient documentation

## 2022-01-27 DIAGNOSIS — Z043 Encounter for examination and observation following other accident: Secondary | ICD-10-CM | POA: Diagnosis not present

## 2022-01-27 DIAGNOSIS — Z0389 Encounter for observation for other suspected diseases and conditions ruled out: Secondary | ICD-10-CM | POA: Diagnosis not present

## 2022-01-27 NOTE — ED Provider Triage Note (Signed)
Emergency Medicine Provider Triage Evaluation Note  Samuel [name] , a 77 y.o. male  was evaluated in triage.  Pt complains of complains of a fall, fell yesterday, states that he lost his balance, fell onto his right side, he did hit his head but denies loss of conscious, he is not on anticoag, he states that he was able to get himself under his under his own power.  He states this morning he woke up with worsening pain in his left chest, he is endorsing any other complaints, he also notes that he is unable to move his fingers and wrists bilaterally, he states that he can feel them but just cannot move them, he states never had this in the past.  Not endorsing any headache change vision paresthesias or weakness..  Review of Systems  Positive: Left-sided rib pain, weakness in the hands Negative: Headaches change in vision  Physical Exam  BP (!) 144/75   Pulse 90   Resp 18   Ht '5\' 9"'$  (1.753 m)   Wt 112.3 kg   SpO2 100%   BMI 36.56 kg/m  Gen:   Awake, no distress   Resp:  Normal effort  MSK:   Moves extremities without difficulty  Other:  No facial asymmetry no difficulty with word finding following two-step commands, he is able to move at his shoulders elbows, he can minimally flex and extend at the wrist unable to move his fingers, sensation intact to light touch, pulses are intact, 2-second capillary refill.  There is no weakness or deficits noted in the lower extremities.  Medical Decision Making  Medically screening exam initiated at 11:13 PM.  Appropriate orders placed.  Samuel [name] was informed that the remainder of the evaluation will be completed by another provider, this initial triage assessment does not replace that evaluation, and the importance of remaining in the ED until their evaluation is complete.  Lab work and imaging have been ordered will need further workup.   Marcello Fennel, PA-C 01/27/22 2315

## 2022-01-27 NOTE — ED Triage Notes (Signed)
The pt fell  Tuesday night he tripped and fell  today he has pain in his chest and both shoulders  he has difficulty moving his fingers

## 2022-01-28 ENCOUNTER — Emergency Department (HOSPITAL_COMMUNITY): Payer: Medicare HMO

## 2022-01-28 DIAGNOSIS — W19XXXA Unspecified fall, initial encounter: Secondary | ICD-10-CM | POA: Diagnosis not present

## 2022-01-28 DIAGNOSIS — Z043 Encounter for examination and observation following other accident: Secondary | ICD-10-CM | POA: Diagnosis not present

## 2022-01-28 DIAGNOSIS — I160 Hypertensive urgency: Secondary | ICD-10-CM | POA: Diagnosis not present

## 2022-01-28 DIAGNOSIS — G9520 Unspecified cord compression: Secondary | ICD-10-CM | POA: Diagnosis not present

## 2022-01-28 DIAGNOSIS — E872 Acidosis, unspecified: Secondary | ICD-10-CM | POA: Diagnosis present

## 2022-01-28 DIAGNOSIS — E782 Mixed hyperlipidemia: Secondary | ICD-10-CM | POA: Diagnosis not present

## 2022-01-28 DIAGNOSIS — C61 Malignant neoplasm of prostate: Secondary | ICD-10-CM | POA: Diagnosis not present

## 2022-01-28 DIAGNOSIS — Y92009 Unspecified place in unspecified non-institutional (private) residence as the place of occurrence of the external cause: Secondary | ICD-10-CM

## 2022-01-28 LAB — HEPATIC FUNCTION PANEL
ALT: 17 U/L (ref 0–44)
AST: 23 U/L (ref 15–41)
Albumin: 4 g/dL (ref 3.5–5.0)
Alkaline Phosphatase: 75 U/L (ref 38–126)
Bilirubin, Direct: 0.1 mg/dL (ref 0.0–0.2)
Indirect Bilirubin: 0.3 mg/dL (ref 0.3–0.9)
Total Bilirubin: 0.4 mg/dL (ref 0.3–1.2)
Total Protein: 7.4 g/dL (ref 6.5–8.1)

## 2022-01-28 LAB — BASIC METABOLIC PANEL
Anion gap: 15 (ref 5–15)
Anion gap: 11 (ref 5–15)
BUN: 16 mg/dL (ref 8–23)
BUN: 10 mg/dL (ref 8–23)
CO2: 16 mmol/L — ABNORMAL LOW (ref 22–32)
CO2: 18 mmol/L — ABNORMAL LOW (ref 22–32)
Calcium: 9.1 mg/dL (ref 8.9–10.3)
Calcium: 8.7 mg/dL — ABNORMAL LOW (ref 8.9–10.3)
Chloride: 105 mmol/L (ref 98–111)
Chloride: 108 mmol/L (ref 98–111)
Creatinine, Ser: 0.92 mg/dL (ref 0.61–1.24)
Creatinine, Ser: 0.78 mg/dL (ref 0.61–1.24)
GFR, Estimated: >60 mL/min (ref >60)
GFR, Estimated: >60 mL/min (ref >60)
Glucose, Bld: 201 mg/dL — ABNORMAL HIGH (ref 70–99)
Glucose, Bld: 151 mg/dL — ABNORMAL HIGH (ref 70–99)
Potassium: 4.3 mmol/L (ref 3.5–5.1)
Potassium: 4.2 mmol/L (ref 3.5–5.1)
Sodium: 136 mmol/L (ref 135–145)
Sodium: 137 mmol/L (ref 135–145)

## 2022-01-28 LAB — URINALYSIS, ROUTINE W REFLEX MICROSCOPIC
Bilirubin Urine: NEGATIVE
Glucose, UA: NEGATIVE mg/dL
Hgb urine dipstick: NEGATIVE
Ketones, ur: 20 mg/dL — AB
Leukocytes,Ua: NEGATIVE
Nitrite: NEGATIVE
Protein, ur: NEGATIVE mg/dL
Specific Gravity, Urine: 1.02 (ref 1.005–1.030)
pH: 7 (ref 5.0–8.0)

## 2022-01-28 LAB — LACTIC ACID, PLASMA
Lactic Acid, Venous: 3.3 mmol/L (ref 0.5–1.9)
Lactic Acid, Venous: 4 mmol/L (ref 0.5–1.9)
Lactic Acid, Venous: 2.5 mmol/L (ref 0.5–1.9)
Lactic Acid, Venous: 1.6 mmol/L (ref 0.5–1.9)

## 2022-01-28 LAB — CBC WITH DIFFERENTIAL/PLATELET
Abs Immature Granulocytes: 0.09 10*3/uL — ABNORMAL HIGH (ref 0.00–0.07)
Abs Immature Granulocytes: 0.04 10*3/uL (ref 0.00–0.07)
Basophils Absolute: 0.1 10*3/uL (ref 0.0–0.1)
Basophils Absolute: 0 10*3/uL (ref 0.0–0.1)
Basophils Relative: 1 %
Basophils Relative: 0 %
Eosinophils Absolute: 0 10*3/uL (ref 0.0–0.5)
Eosinophils Absolute: 0 10*3/uL (ref 0.0–0.5)
Eosinophils Relative: 0 %
Eosinophils Relative: 0 %
HCT: 41 % (ref 39.0–52.0)
HCT: 42 % (ref 39.0–52.0)
Hemoglobin: 14.4 g/dL (ref 13.0–17.0)
Hemoglobin: 14.4 g/dL (ref 13.0–17.0)
Immature Granulocytes: 1 %
Immature Granulocytes: 1 %
Lymphocytes Relative: 44 %
Lymphocytes Relative: 26 %
Lymphs Abs: 3.8 10*3/uL (ref 0.7–4.0)
Lymphs Abs: 1.8 10*3/uL (ref 0.7–4.0)
MCH: 31.6 pg (ref 26.0–34.0)
MCH: 31.9 pg (ref 26.0–34.0)
MCHC: 35.1 g/dL (ref 30.0–36.0)
MCHC: 34.3 g/dL (ref 30.0–36.0)
MCV: 90.1 fL (ref 80.0–100.0)
MCV: 93.1 fL (ref 80.0–100.0)
Monocytes Absolute: 0.7 10*3/uL (ref 0.1–1.0)
Monocytes Absolute: 0.5 10*3/uL (ref 0.1–1.0)
Monocytes Relative: 8 %
Monocytes Relative: 8 %
Neutro Abs: 4 10*3/uL (ref 1.7–7.7)
Neutro Abs: 4.6 10*3/uL (ref 1.7–7.7)
Neutrophils Relative %: 46 %
Neutrophils Relative %: 65 %
Platelets: 289 10*3/uL (ref 150–400)
Platelets: 259 10*3/uL (ref 150–400)
RBC: 4.55 MIL/uL (ref 4.22–5.81)
RBC: 4.51 MIL/uL (ref 4.22–5.81)
RDW: 13.4 % (ref 11.5–15.5)
RDW: 13.5 % (ref 11.5–15.5)
WBC: 8.6 10*3/uL (ref 4.0–10.5)
WBC: 7 10*3/uL (ref 4.0–10.5)
nRBC: 0 % (ref 0.0–0.2)
nRBC: 0 % (ref 0.0–0.2)

## 2022-01-28 LAB — SEDIMENTATION RATE: Sed Rate: 19 mm/hr — ABNORMAL HIGH (ref 0–16)

## 2022-01-28 LAB — CK: Total CK: 99 U/L (ref 49–397)

## 2022-01-28 LAB — C-REACTIVE PROTEIN: CRP: <0.5 mg/dL (ref <1.0)

## 2022-01-28 LAB — ETHANOL: Alcohol, Ethyl (B): <10 mg/dL (ref <10)

## 2022-01-28 LAB — SALICYLATE LEVEL: Salicylate Lvl: <7 mg/dL — ABNORMAL LOW (ref 7.0–30.0)

## 2022-01-28 LAB — PROCALCITONIN: Procalcitonin: <0.1 ng/mL

## 2022-01-28 MED ORDER — ONDANSETRON HCL 4 MG/2ML IJ SOLN: 4.0000 mg | Freq: Four times a day (QID) | INTRAMUSCULAR | Status: DC | PRN

## 2022-01-28 MED ORDER — ACETAMINOPHEN 325 MG PO TABS: 650.0000 mg | ORAL_TABLET | Freq: Four times a day (QID) | ORAL | Status: DC | PRN

## 2022-01-28 MED ORDER — TRAMADOL HCL 50 MG PO TABS: 50.0000 mg | ORAL_TABLET | Freq: Four times a day (QID) | ORAL | Status: DC | PRN

## 2022-01-28 MED ORDER — SODIUM CHLORIDE 0.9 % IV BOLUS
1000.0000 mL | Freq: Once | INTRAVENOUS | Status: AC
  Administered 2022-01-28: 1000 mL via INTRAVENOUS

## 2022-01-28 MED ORDER — ALBUTEROL SULFATE (2.5 MG/3ML) 0.083% IN NEBU: 2.5000 mg | INHALATION_SOLUTION | Freq: Four times a day (QID) | RESPIRATORY_TRACT | Status: DC | PRN

## 2022-01-28 MED ORDER — ACETAMINOPHEN 650 MG RE SUPP: 650.0000 mg | Freq: Four times a day (QID) | RECTAL | Status: DC | PRN

## 2022-01-28 MED ORDER — SODIUM CHLORIDE 0.9% FLUSH
3.0000 mL | Freq: Two times a day (BID) | INTRAVENOUS | Status: DC
  Administered 2022-01-28 – 2022-01-29 (×3): 3 mL via INTRAVENOUS

## 2022-01-28 MED ORDER — ASPIRIN 325 MG PO TBEC
325.0000 mg | DELAYED_RELEASE_TABLET | Freq: Every day | ORAL | Status: DC
  Administered 2022-01-28 – 2022-01-29 (×2): 325 mg via ORAL
  Filled 2022-01-28 (×2): qty 1

## 2022-01-28 MED ORDER — HYDRALAZINE HCL 20 MG/ML IJ SOLN: 10.0000 mg | INTRAMUSCULAR | Status: DC | PRN

## 2022-01-28 MED ORDER — ENOXAPARIN SODIUM 40 MG/0.4ML IJ SOSY
40.0000 mg | PREFILLED_SYRINGE | INTRAMUSCULAR | Status: DC
  Administered 2022-01-28 – 2022-01-29 (×2): 40 mg via SUBCUTANEOUS
  Filled 2022-01-28 (×2): qty 0.4

## 2022-01-28 MED ORDER — ONDANSETRON HCL 4 MG PO TABS: 4.0000 mg | ORAL_TABLET | Freq: Four times a day (QID) | ORAL | Status: DC | PRN

## 2022-01-28 MED ORDER — ATORVASTATIN CALCIUM 10 MG PO TABS
20.0000 mg | ORAL_TABLET | Freq: Every morning | ORAL | Status: DC
  Administered 2022-01-28 – 2022-01-29 (×2): 20 mg via ORAL
  Filled 2022-01-28 (×2): qty 2

## 2022-01-28 NOTE — Progress Notes (Signed)
PT Cancellation Note  Patient Details Name: Samuel [name] MRN: [id number] DOB: [birth year]   Cancelled Treatment:    Reason Eval/Treat Not Completed: Other (comment).  Pt feels he is at baseline for LE's and declines PT but agreed to let nursing know if this changes.  Signing off for now.   Ramond Dial 01/28/2022, 2:23 PM  Mee Hives, PT PhD Acute Rehab Dept. Number: [place] [facility] [place]

## 2022-01-28 NOTE — ED Notes (Signed)
ED TO INPATIENT HANDOFF REPORT  ED Nurse Name and Phone #: Noelly Lasseigne RN [phone]  S Name/Age/Gender Samuel [name] 76 y.o. male Room/Bed: 013C/013C  Code Status   Code Status: Full Code  Home/SNF/Other Home Patient oriented to: self, place, time, and situation Is this baseline? Yes   Triage Complete: Triage complete  Chief Complaint Upper extremity weakness [R29.898]  Triage Note The pt fell  Tuesday night he tripped and fell  today he has pain in his chest and both shoulders  he has difficulty moving his fingers     Allergies Allergies  Allergen Reactions   Other Other (See Comments)    Patient stated,"if I touch felt (material) I get "knots" on my body."   Peach [Prunus Persica] Other (See Comments)    Patient stated,"if I touch or eat peach fuzz, I get "knots" all over my face and mouth."     Level of Care/Admitting Diagnosis ED Disposition     ED Disposition  Admit   Condition  --   [place]: [place] [100100]  Level of Care: Telemetry Medical [104]  May place patient in observation at [facility] or [facility] if equivalent level of care is available:: No  Covid Evaluation: Asymptomatic - no recent exposure (last 10 days) testing not required  Diagnosis: Upper extremity weakness [017494]  Admitting Physician: Rhetta Mura [[id number]]  Attending Physician: Rhetta Mura [[id number]]          B Medical/Surgery History Past Medical History:  Diagnosis Date   Arthritis    Blind left eye    Cyst in hand 10/2013   left hand, treated with antibiotic & pain medicine   Diabetes mellitus without complication (HCC)    L9J 7.1% 04/28/20   Heart murmur    Hepatitis A    25 YRS AGO   Hyperlipidemia    Pre-diabetes    Prostate cancer ([place])    Sleep apnea    ????   O2 DESAT  10/26/13  IN ED , no sleep study ever done per pt- no cpap   Past Surgical History:  Procedure Laterality Date   COLONOSCOPY     EYE SURGERY      LEFT AYE AS CHILD    JOINT REPLACEMENT Bilateral    LT KNEE 3 YRS AGO    MOUTH SURGERY  09/24/2020   MULTIPLE EXTRACTIONS WITH ALVEOLOPLASTY N/A 08/21/2020   Procedure: MULTIPLE EXTRACTION WITH ALVEOLOPLASTY;  Surgeon: Charlaine Dalton, DMD;  Location: [place];  Service: Dentistry;  Laterality: N/A;   TOTAL KNEE ARTHROPLASTY Right 12/04/2013   dr Rhona Raider   TOTAL KNEE ARTHROPLASTY Right 12/04/2013   Procedure: TOTAL KNEE ARTHROPLASTY;  Surgeon: Hessie Dibble, MD;  Location: [place];  Service: Orthopedics;  Laterality: Right;     A IV Location/Drains/Wounds Patient Lines/Drains/Airways Status     Active Line/Drains/Airways     Name Placement date Placement time Site Days   Peripheral IV 01/28/22 20 G Posterior;Right Hand 01/28/22  0100  Hand  less than 1   Incision (Closed) 12/04/13 Knee Right 12/04/13  1159  -- 2977   Incision (Closed) 08/21/20 Lip Other (Comment) 08/21/20  0802  -- 525            Intake/Output Last 24 hours  Intake/Output Summary (Last 24 hours) at 01/28/2022 1343 Last data filed at 01/28/2022 1255 Gross per 24 hour  Intake 1815.85 ml  Output 200 ml  Net 1615.85 ml  Labs/Imaging Results for orders placed or performed during the hospital encounter of 01/27/22 (from the past 48 hour(s))  Basic metabolic panel     Status: Abnormal   Collection Time: 01/27/22 11:20 PM  Result Value Ref Range   Sodium 136 135 - 145 mmol/L   Potassium 4.3 3.5 - 5.1 mmol/L   Chloride 105 98 - 111 mmol/L   CO2 16 (L) 22 - 32 mmol/L   Glucose, Bld 201 (H) 70 - 99 mg/dL    Comment: Glucose reference range applies only to samples taken after fasting for at least 8 hours.   BUN 16 8 - 23 mg/dL   Creatinine, Ser 0.92 0.61 - 1.24 mg/dL   Calcium 9.1 8.9 - 10.3 mg/dL   GFR, Estimated >60 >60 mL/min    Comment: (NOTE) Calculated using the CKD-EPI Creatinine Equation (2021)    Anion gap 15 5 - 15    Comment: Performed at [facility] [address]  [place], [place] [address]  CBC with Differential     Status: Abnormal   Collection Time: 01/27/22 11:20 PM  Result Value Ref Range   WBC 8.6 4.0 - 10.5 K/uL   RBC 4.55 4.22 - 5.81 MIL/uL   Hemoglobin 14.4 13.0 - 17.0 g/dL   HCT 41.0 39.0 - 52.0 %   MCV 90.1 80.0 - 100.0 fL   MCH 31.6 26.0 - 34.0 pg   MCHC 35.1 30.0 - 36.0 g/dL   RDW 13.4 11.5 - 15.5 %   Platelets 289 150 - 400 K/uL   nRBC 0.0 0.0 - 0.2 %   Neutrophils Relative % 46 %   Neutro Abs 4.0 1.7 - 7.7 K/uL   Lymphocytes Relative 44 %   Lymphs Abs 3.8 0.7 - 4.0 K/uL   Monocytes Relative 8 %   Monocytes Absolute 0.7 0.1 - 1.0 K/uL   Eosinophils Relative 0 %   Eosinophils Absolute 0.0 0.0 - 0.5 K/uL   Basophils Relative 1 %   Basophils Absolute 0.1 0.0 - 0.1 K/uL   Immature Granulocytes 1 %   Abs Immature Granulocytes 0.09 (H) 0.00 - 0.07 K/uL    Comment: Performed at [facility] [address], [place], [place] [address]  Lactic acid, plasma     Status: Abnormal   Collection Time: 01/28/22 12:44 AM  Result Value Ref Range   Lactic Acid, Venous 3.3 (HH) 0.5 - 1.9 mmol/L    Comment: CRITICAL RESULT CALLED TO, READ BACK BY AND VERIFIED WITH A.COBB,RN. 0150 01/28/22. L.PAIT Performed at [facility], [place] [address] [place], [place] [address]   Ethanol     Status: None   Collection Time: 01/28/22 12:44 AM  Result Value Ref Range   Alcohol, Ethyl (B) <10 <10 mg/dL    Comment: (NOTE) Lowest detectable limit for serum alcohol is 10 mg/dL.  For medical purposes only. Performed at [facility], [place] [address], [place], [place] [address]   Salicylate level     Status: Abnormal   Collection Time: 01/28/22 12:44 AM  Result Value Ref Range   Salicylate Lvl <8.6 (L) 7.0 - 30.0 mg/dL    Comment: Performed at [facility] [address] [place],  [address]  Urinalysis, Routine w reflex microscopic Urine, Clean Catch     Status: Abnormal   Collection Time: 01/28/22  1:57 AM  Result Value Ref  Range   Color, Urine YELLOW YELLOW   APPearance CLEAR CLEAR  Specific Gravity, Urine 1.020 1.005 - 1.030   pH 7.0 5.0 - 8.0   Glucose, UA NEGATIVE NEGATIVE mg/dL   Hgb urine dipstick NEGATIVE NEGATIVE   Bilirubin Urine NEGATIVE NEGATIVE   Ketones, ur 20 (A) NEGATIVE mg/dL   Protein, ur NEGATIVE NEGATIVE mg/dL   Nitrite NEGATIVE NEGATIVE   Leukocytes,Ua NEGATIVE NEGATIVE    Comment: Performed at [facility] [address] [place], [place] [address]  Lactic acid, plasma     Status: Abnormal   Collection Time: 01/28/22  4:26 AM  Result Value Ref Range   Lactic Acid, Venous 4.0 (HH) 0.5 - 1.9 mmol/L    Comment: CRITICAL VALUE NOTED. VALUE IS CONSISTENT WITH PREVIOUSLY REPORTED/CALLED VALUE Performed at [facility], [place] [address], [place], [place] [address]   Basic metabolic panel     Status: Abnormal   Collection Time: 01/28/22  5:34 AM  Result Value Ref Range   Sodium 137 135 - 145 mmol/L   Potassium 4.2 3.5 - 5.1 mmol/L   Chloride 108 98 - 111 mmol/L   CO2 18 (L) 22 - 32 mmol/L   Glucose, Bld 151 (H) 70 - 99 mg/dL    Comment: Glucose reference range applies only to samples taken after fasting for at least 8 hours.   BUN 10 8 - 23 mg/dL   Creatinine, Ser 0.78 0.61 - 1.24 mg/dL   Calcium 8.7 (L) 8.9 - 10.3 mg/dL   GFR, Estimated >60 >60 mL/min    Comment: (NOTE) Calculated using the CKD-EPI Creatinine Equation (2021)    Anion gap 11 5 - 15    Comment: Performed at [facility] [address], [place], [place] [address]  CBC with Differential/Platelet     Status: None   Collection Time: 01/28/22  5:34 AM  Result Value Ref Range   WBC 7.0 4.0 - 10.5 K/uL   RBC 4.51 4.22 - 5.81 MIL/uL   Hemoglobin 14.4 13.0 - 17.0 g/dL   HCT 42.0 39.0 - 52.0 %   MCV 93.1 80.0 - 100.0 fL   MCH 31.9 26.0 - 34.0 pg   MCHC 34.3 30.0 - 36.0 g/dL   RDW 13.5 11.5 - 15.5 %   Platelets 259 150 - 400 K/uL   nRBC 0.0 0.0 - 0.2 %   Neutrophils Relative % 65 %   Neutro Abs 4.6  1.7 - 7.7 K/uL   Lymphocytes Relative 26 %   Lymphs Abs 1.8 0.7 - 4.0 K/uL   Monocytes Relative 8 %   Monocytes Absolute 0.5 0.1 - 1.0 K/uL   Eosinophils Relative 0 %   Eosinophils Absolute 0.0 0.0 - 0.5 K/uL   Basophils Relative 0 %   Basophils Absolute 0.0 0.0 - 0.1 K/uL   Immature Granulocytes 1 %   Abs Immature Granulocytes 0.04 0.00 - 0.07 K/uL    Comment: Performed at [facility], [address], [place], [place] [address]  CK     Status: None   Collection Time: 01/28/22  9:37 AM  Result Value Ref Range   Total CK 99 49 - 397 U/L    Comment: Performed at [facility], [place] [address] [place], [place] [address]  Hepatic function panel     Status: None   Collection Time: 01/28/22  9:37 AM  Result Value Ref Range   Total Protein 7.4 6.5 - 8.1 g/dL   Albumin 4.0 3.5 - 5.0 g/dL   AST 23 15 -  41 U/L   ALT 17 0 - 44 U/L   Alkaline Phosphatase 75 38 - 126 U/L   Total Bilirubin 0.4 0.3 - 1.2 mg/dL   Bilirubin, Direct 0.1 0.0 - 0.2 mg/dL   Indirect Bilirubin 0.3 0.3 - 0.9 mg/dL    Comment: Performed at [facility] [address] [place], [place] [address]  Procalcitonin - Baseline     Status: None   Collection Time: 01/28/22  9:37 AM  Result Value Ref Range   Procalcitonin <0.10 ng/mL    Comment:        Interpretation: PCT (Procalcitonin) <= 0.5 ng/mL: Systemic infection (sepsis) is not likely. Local bacterial infection is possible. (NOTE)       Sepsis PCT Algorithm           Lower Respiratory Tract                                      Infection PCT Algorithm    ----------------------------     ----------------------------         PCT < 0.25 ng/mL                PCT < 0.10 ng/mL          Strongly encourage             Strongly discourage   discontinuation of antibiotics    initiation of antibiotics    ----------------------------     -----------------------------       PCT 0.25 - 0.50 ng/mL            PCT 0.10 - 0.25 ng/mL               OR       >80%  decrease in PCT            Discourage initiation of                                            antibiotics      Encourage discontinuation           of antibiotics    ----------------------------     -----------------------------         PCT >= 0.50 ng/mL              PCT 0.26 - 0.50 ng/mL               AND        <80% decrease in PCT             Encourage initiation of                                             antibiotics       Encourage continuation           of antibiotics    ----------------------------     -----------------------------        PCT >= 0.50 ng/mL                  PCT > 0.50 ng/mL               AND  increase in PCT                  Strongly encourage                                      initiation of antibiotics    Strongly encourage escalation           of antibiotics                                     -----------------------------                                           PCT <= 0.25 ng/mL                                                 OR                                        > 80% decrease in PCT                                      Discontinue / Do not initiate                                             antibiotics  Performed at [facility], [address], [place], [place] [address]   Sedimentation rate     Status: Abnormal   Collection Time: 01/28/22  9:37 AM  Result Value Ref Range   Sed Rate 19 (H) 0 - 16 mm/hr    Comment: Performed at [facility] [address] [place], [place] [address]  C-reactive protein     Status: None   Collection Time: 01/28/22  9:37 AM  Result Value Ref Range   CRP <0.5 <1.0 mg/dL    Comment: Performed at [facility] [address], [place], [place] [address]  Lactic acid, plasma     Status: Abnormal   Collection Time: 01/28/22  9:37 AM  Result Value Ref Range   Lactic Acid, Venous 2.5 (HH) 0.5 - 1.9 mmol/L    Comment: CRITICAL VALUE NOTED. VALUE IS CONSISTENT WITH PREVIOUSLY  REPORTED/CALLED VALUE Performed at [facility], [place] [address], [place], [place] [address]   Lactic acid, plasma     Status: None   Collection Time: 01/28/22 12:27 PM  Result Value Ref Range   Lactic Acid, Venous 1.6 0.5 - 1.9 mmol/L    Comment: Performed at [facility] [address] [place], [place] [address]   MR Cervical Spine Wo Contrast  Result Date: 01/28/2022 CLINICAL DATA:  Fall yesterday EXAM: MRI CERVICAL SPINE WITHOUT CONTRAST TECHNIQUE: Multiplanar, multisequence MR imaging of the cervical spine was performed. No intravenous contrast  was administered. COMPARISON:  None Available. FINDINGS: Alignment: No traumatic malalignment. Mild degenerative anterolisthesis at C7-T1. Vertebrae: No fracture, evidence of discitis, or bone lesion. Cord: Faint T2 hyperintensity at the level of the central cord at C5 and C6, not confidently seen on the other sequences. Posterior Fossa, vertebral arteries, paraspinal tissues: No strain, spurring, or swelling seen. Disc levels: C2-3: Degenerative facet spurring asymmetric to the right C3-4: Disc narrowing and endplate/uncovertebral ridging with asymmetric right facet spurring. Posterior ridging indents the ventral cord. Left more than right foraminal stenosis C4-5: Disc space narrowing and uncovertebral ridging asymmetric to the left where there is foraminal stenosis. Patent spinal canal with mild narrowing C5-6: Disc narrowing with central protrusion indenting the ventral cord. Asymmetric left uncovertebral ridging causing mild or moderate foraminal stenosis C6-7: Disc narrowing with endplate and uncovertebral ridging. Ridging contacts the ventral cord. Mild bilateral foraminal narrowing C7-T1:Degenerative facet spurring asymmetric to the left with mild anterolisthesis. Disc space narrowing. Mild left foraminal stenosis. IMPRESSION: 1. Equivocal for minor central cord edema/contusion at C5 and C6, but only seen on 1 sequence. 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 No occult fracture or soft tissue injury. 2. Multilevel foraminal stenosis described above. Electronically Signed   By: Jorje Guild M.D.   On: 01/28/2022 04:15   DG Ribs Unilateral W/Chest Left  Result Date: 01/27/2022 CLINICAL DATA:  Anterior left rib pain, initial encounter EXAM: LEFT RIBS AND CHEST - 3+ VIEW COMPARISON:  11/27/2021 FINDINGS: Cardiac shadow is within normal limits. Tortuous thoracic aorta is noted with calcifications. The lungs are clear. No discrete rib fracture is seen. No pneumothorax is noted. IMPRESSION: No rib fracture noted. Electronically Signed   By: Inez Catalina M.D.   On: 01/27/2022 23:57   CT Head Wo Contrast  Result Date: 01/27/2022 CLINICAL DATA:  Trauma. EXAM: CT HEAD WITHOUT CONTRAST CT CERVICAL SPINE WITHOUT CONTRAST TECHNIQUE: Multidetector CT imaging of the head and cervical spine was performed following the standard protocol without intravenous contrast. Multiplanar CT image reconstructions of the cervical spine were also generated. RADIATION DOSE REDUCTION: This exam was performed according to the departmental dose-optimization program which includes automated exposure control, adjustment of the mA and/or kV according to patient size and/or use of iterative reconstruction technique. COMPARISON:  None Available. FINDINGS: CT HEAD FINDINGS Brain: No evidence of acute infarction, hemorrhage, hydrocephalus, extra-axial collection or mass lesion/mass effect. Vascular: Atherosclerotic calcifications are present within the cavernous internal carotid arteries. Skull: Normal. Negative for fracture or focal lesion. Sinuses/Orbits: There are chronic appearing calcifications in the left globe. No acute abnormalities. Visualized paranasal sinuses and mastoid air cells are clear. Other: None. CT CERVICAL SPINE FINDINGS Alignment: Normal. Skull base and vertebrae: No acute fracture. No primary  bone lesion or focal pathologic process. Soft tissues and spinal canal: No prevertebral fluid or swelling. No visible canal hematoma. Disc levels: There is moderate disc space narrowing and endplate osteophyte formation throughout the cervical spine from C3 through T1 compatible with degenerative change. There is moderate severe left-sided neural foraminal stenosis at C3-C4 secondary to facet arthropathy and uncovertebral spurring. There is moderate central canal stenosis at C3-C4, C4-C5 and C5-C6 secondary to posterior calcified disc bulges. Upper chest: There is some patchy ground-glass opacities in the right lung apex. Other: None. IMPRESSION: 1. No acute intracranial process. 2. No acute fracture or traumatic subluxation of the cervical spine. 3. Patchy ground-glass opacities in the right lung apex may be infectious or  inflammatory. Electronically Signed   By: Ronney Asters M.D.   On: 01/27/2022 23:53   CT Cervical Spine Wo Contrast  Result Date: 01/27/2022 CLINICAL DATA:  Trauma. EXAM: CT HEAD WITHOUT CONTRAST CT CERVICAL SPINE WITHOUT CONTRAST TECHNIQUE: Multidetector CT imaging of the head and cervical spine was performed following the standard protocol without intravenous contrast. Multiplanar CT image reconstructions of the cervical spine were also generated. RADIATION DOSE REDUCTION: This exam was performed according to the departmental dose-optimization program which includes automated exposure control, adjustment of the mA and/or kV according to patient size and/or use of iterative reconstruction technique. COMPARISON:  None Available. FINDINGS: CT HEAD FINDINGS Brain: No evidence of acute infarction, hemorrhage, hydrocephalus, extra-axial collection or mass lesion/mass effect. Vascular: Atherosclerotic calcifications are present within the cavernous internal carotid arteries. Skull: Normal. Negative for fracture or focal lesion. Sinuses/Orbits: There are chronic appearing calcifications in the left  globe. No acute abnormalities. Visualized paranasal sinuses and mastoid air cells are clear. Other: None. CT CERVICAL SPINE FINDINGS Alignment: Normal. Skull base and vertebrae: No acute fracture. No primary bone lesion or focal pathologic process. Soft tissues and spinal canal: No prevertebral fluid or swelling. No visible canal hematoma. Disc levels: There is moderate disc space narrowing and endplate osteophyte formation throughout the cervical spine from C3 through T1 compatible with degenerative change. There is moderate severe left-sided neural foraminal stenosis at C3-C4 secondary to facet arthropathy and uncovertebral spurring. There is moderate central canal stenosis at C3-C4, C4-C5 and C5-C6 secondary to posterior calcified disc bulges. Upper chest: There is some patchy ground-glass opacities in the right lung apex. Other: None. IMPRESSION: 1. No acute intracranial process. 2. No acute fracture or traumatic subluxation of the cervical spine. 3. Patchy ground-glass opacities in the right lung apex may be infectious or inflammatory. Electronically Signed   By: Ronney Asters M.D.   On: 01/27/2022 23:53    Pending Labs Unresulted Labs (From admission, onward)     Start     Ordered   01/29/22 0500  CBC  Tomorrow morning,   R        01/28/22 0848   01/28/22 0840  Culture, blood (single) w Reflex to ID Panel  Once,   R        01/28/22 0840   01/28/22 0044  Blood gas, venous  Once,   R        01/28/22 0043            Vitals/Pain Today's Vitals   01/28/22 1225 01/28/22 1300 01/28/22 1315 01/28/22 1330  BP: (!) 152/85 (!) 153/69    Pulse: 75 88 88 95  Resp: 19 (!) '22 19 15  '$ Temp:      TempSrc:      SpO2: 100% 98% 100% 96%  Weight:      Height:      PainSc:        Isolation Precautions No active isolations  Medications Medications  acetaminophen (TYLENOL) tablet 650 mg (has no administration in time range)    Or  acetaminophen (TYLENOL) suppository 650 mg (has no administration  in time range)  enoxaparin (LOVENOX) injection 40 mg (40 mg Subcutaneous Given 01/28/22 1035)  sodium chloride flush (NS) 0.9 % injection 3 mL (3 mLs Intravenous Given 01/28/22 0940)  ondansetron (ZOFRAN) tablet 4 mg (has no administration in time range)    Or  ondansetron (ZOFRAN) injection 4 mg (has no administration in time range)  albuterol (PROVENTIL) (2.5 MG/3ML) 0.083% nebulizer solution 2.5 mg (  has no administration in time range)  hydrALAZINE (APRESOLINE) injection 10 mg (has no administration in time range)  aspirin EC tablet 325 mg (325 mg Oral Given 01/28/22 1217)  atorvastatin (LIPITOR) tablet 20 mg (20 mg Oral Given 01/28/22 1217)  traMADol (ULTRAM) tablet 50 mg (has no administration in time range)  sodium chloride 0.9 % bolus 1,000 mL (0 mLs Intravenous Stopped 01/28/22 0336)  sodium chloride 0.9 % bolus 1,000 mL (0 mLs Intravenous Stopped 01/28/22 0157)    Mobility walks with device Moderate fall risk   Focused Assessments Cardiac Assessment Handoff:    Lab Results  Component Value Date   CKTOTAL 99 01/28/2022   TROPONINI <0.03 11/07/2017   No results found for: "DDIMER" Does the Patient currently have chest pain? No   , Neuro Assessment Handoff:  Swallow screen pass? Yes          Neuro Assessment: Exceptions to WDL Neuro Checks:      Last Documented NIHSS Modified Score:   Has TPA been given? No If patient is a Neuro Trauma and patient is going to OR before floor call report to [facility] nurse: [phone] or [phone]  , Pulmonary Assessment Handoff:  Lung sounds:   O2 Device: Room Air      R Recommendations: See Admitting Provider Note  Report given to:   Additional Notes:

## 2022-01-28 NOTE — ED Provider Notes (Signed)
[facility] EMERGENCY DEPARTMENT Provider Note   CSN: [id number] Arrival date & time: 01/27/22  2214     History  Chief Complaint  Patient presents with   Fall   Chest Pain    Samuel [name] is a 76 y.o. male.  76 yo M with a chief complaints of a fall.  The patient states he was ambulating and he turned too quickly and he lost his balance and fell landed on his right side.  Complaining of pain to the left side of the chest wall.  He also tells me that he is having trouble moving his hand since the event.  They feel very tingly.  He denies being stuck in a certain position after the fall.  Says he was able to crawl and then was eventually able to get up and walk.  He denies any injury to his legs.  Denies any back pain neck pain.  Denies headache.   Fall Associated symptoms include chest pain.  Chest Pain      Home Medications Prior to Admission medications   Medication Sig Start Date End Date Taking? Authorizing Provider  aspirin EC 325 MG tablet Take 1 tablet (325 mg total) by mouth daily. 05/29/21   [name], NP  atorvastatin (LIPITOR) 20 MG tablet Take 1 tablet (20 mg total) by mouth every morning. 11/10/21   [name], NP  losartan (COZAAR) 25 MG tablet Take 1 tablet (25 mg total) by mouth daily. 11/27/21   [name], NP  triptorelin (TRELESTAR LA) 11.25 MG injection Inject 11.25 mg into the muscle every 6 (six) months.    [provider]  XTANDI 40 MG tablet Take 4 tablets ('160mg'$ ) by mouth once daily as directed by physician. 01/13/22   Wyatt Portela, MD      Allergies    Other and Peach [prunus persica]    Review of Systems   Review of Systems  Cardiovascular:  Positive for chest pain.    Physical Exam Updated Vital Signs BP (!) 155/84   Pulse 74   Temp 98 F (36.7 C) (Oral)   Resp 14   Ht '5\' 9"'$  (1.753 m)   Wt 112.3 kg   SpO2 93%   BMI 36.56 kg/m  Physical Exam Vitals and nursing note reviewed.   Constitutional:      Appearance: He is well-developed.  HENT:     Head: Normocephalic and atraumatic.  Eyes:     Pupils: Pupils are equal, round, and reactive to light.  Neck:     Vascular: No JVD.  Cardiovascular:     Rate and Rhythm: Normal rate and regular rhythm.     Heart sounds: No murmur heard.    No friction rub. No gallop.  Pulmonary:     Effort: No respiratory distress.     Breath sounds: No wheezing.  Abdominal:     General: There is no distension.     Tenderness: There is no abdominal tenderness. There is no guarding or rebound.  Musculoskeletal:        General: Normal range of motion.     Cervical back: Normal range of motion and neck supple.     Comments: Hands are held in a neutral position but tells me that he is unable to move the fingers.  He is able to range at the wrist elbows and shoulders up to about 90 degrees.  Has intact sensation and pulse but tells me he cannot move the  fingers at all.  Skin:    Coloration: Skin is not pale.     Findings: No rash.  Neurological:     Mental Status: He is alert and oriented to person, place, and time.  Psychiatric:        Behavior: Behavior normal.     ED Results / Procedures / Treatments   Labs (all labs ordered are listed, but only abnormal results are displayed) Labs Reviewed  BASIC METABOLIC PANEL - Abnormal; Notable for the following components:      Result Value   CO2 16 (*)    Glucose, Bld 201 (*)    All other components within normal limits  CBC WITH DIFFERENTIAL/PLATELET - Abnormal; Notable for the following components:   Abs Immature Granulocytes 0.09 (*)    All other components within normal limits  LACTIC ACID, PLASMA - Abnormal; Notable for the following components:   Lactic Acid, Venous 3.3 (*)    All other components within normal limits  URINALYSIS, ROUTINE W REFLEX MICROSCOPIC - Abnormal; Notable for the following components:   Ketones, ur 20 (*)    All other components within normal limits   SALICYLATE LEVEL - Abnormal; Notable for the following components:   Salicylate Lvl <5.0 (*)    All other components within normal limits  ETHANOL  BLOOD GAS, VENOUS  LACTIC ACID, PLASMA    EKG EKG Interpretation  Date/Time:  Wednesday January 27 2022 22:37:55 EST Ventricular Rate:  88 PR Interval:  170 QRS Duration: 90 QT Interval:  396 QTC Calculation: 479 R Axis:   -22 Text Interpretation: Sinus rhythm with Premature atrial complexes Minimal voltage criteria for LVH, may be normal variant ( R in aVL ) Borderline ECG No significant change since last tracing Confirmed by Deno Etienne [phone]) on 01/27/2022 11:04:23 PM  Radiology MR Cervical Spine Wo Contrast  Result Date: 01/28/2022 CLINICAL DATA:  Fall yesterday EXAM: MRI CERVICAL SPINE WITHOUT CONTRAST TECHNIQUE: Multiplanar, multisequence MR imaging of the cervical spine was performed. No intravenous contrast was administered. COMPARISON:  None Available. FINDINGS: Alignment: No traumatic malalignment. Mild degenerative anterolisthesis at C7-T1. Vertebrae: No fracture, evidence of discitis, or bone lesion. Cord: Faint T2 hyperintensity at the level of the central cord at C5 and C6, not confidently seen on the other sequences. Posterior Fossa, vertebral arteries, paraspinal tissues: No strain, spurring, or swelling seen. Disc levels: C2-3: Degenerative facet spurring asymmetric to the right C3-4: Disc narrowing and endplate/uncovertebral ridging with asymmetric right facet spurring. Posterior ridging indents the ventral cord. Left more than right foraminal stenosis C4-5: Disc space narrowing and uncovertebral ridging asymmetric to the left where there is foraminal stenosis. Patent spinal canal with mild narrowing C5-6: Disc narrowing with central protrusion indenting the ventral cord. Asymmetric left uncovertebral ridging causing mild or moderate foraminal stenosis C6-7: Disc narrowing with endplate and uncovertebral ridging. Ridging  contacts the ventral cord. Mild bilateral foraminal narrowing C7-T1:Degenerative facet spurring asymmetric to the left with mild anterolisthesis. Disc space narrowing. Mild left foraminal stenosis. IMPRESSION: 1. Equivocal for minor central cord edema/contusion at C5 and C6, but only seen on 1 sequence. 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 No occult fracture or soft tissue injury. 2. Multilevel foraminal stenosis described above. Electronically Signed   By: Jorje Guild M.D.   On: 01/28/2022 04:15   DG Ribs Unilateral W/Chest Left  Result Date: 01/27/2022 CLINICAL DATA:  Anterior left rib pain, initial encounter EXAM: LEFT RIBS AND  CHEST - 3+ VIEW COMPARISON:  11/27/2021 FINDINGS: Cardiac shadow is within normal limits. Tortuous thoracic aorta is noted with calcifications. The lungs are clear. No discrete rib fracture is seen. No pneumothorax is noted. IMPRESSION: No rib fracture noted. Electronically Signed   By: Inez Catalina M.D.   On: 01/27/2022 23:57   CT Head Wo Contrast  Result Date: 01/27/2022 CLINICAL DATA:  Trauma. EXAM: CT HEAD WITHOUT CONTRAST CT CERVICAL SPINE WITHOUT CONTRAST TECHNIQUE: Multidetector CT imaging of the head and cervical spine was performed following the standard protocol without intravenous contrast. Multiplanar CT image reconstructions of the cervical spine were also generated. RADIATION DOSE REDUCTION: This exam was performed according to the departmental dose-optimization program which includes automated exposure control, adjustment of the mA and/or kV according to patient size and/or use of iterative reconstruction technique. COMPARISON:  None Available. FINDINGS: CT HEAD FINDINGS Brain: No evidence of acute infarction, hemorrhage, hydrocephalus, extra-axial collection or mass lesion/mass effect. Vascular: Atherosclerotic calcifications are present within the cavernous internal carotid arteries. Skull: Normal.  Negative for fracture or focal lesion. Sinuses/Orbits: There are chronic appearing calcifications in the left globe. No acute abnormalities. Visualized paranasal sinuses and mastoid air cells are clear. Other: None. CT CERVICAL SPINE FINDINGS Alignment: Normal. Skull base and vertebrae: No acute fracture. No primary bone lesion or focal pathologic process. Soft tissues and spinal canal: No prevertebral fluid or swelling. No visible canal hematoma. Disc levels: There is moderate disc space narrowing and endplate osteophyte formation throughout the cervical spine from C3 through T1 compatible with degenerative change. There is moderate severe left-sided neural foraminal stenosis at C3-C4 secondary to facet arthropathy and uncovertebral spurring. There is moderate central canal stenosis at C3-C4, C4-C5 and C5-C6 secondary to posterior calcified disc bulges. Upper chest: There is some patchy ground-glass opacities in the right lung apex. Other: None. IMPRESSION: 1. No acute intracranial process. 2. No acute fracture or traumatic subluxation of the cervical spine. 3. Patchy ground-glass opacities in the right lung apex may be infectious or inflammatory. Electronically Signed   By: Ronney Asters M.D.   On: 01/27/2022 23:53   CT Cervical Spine Wo Contrast  Result Date: 01/27/2022 CLINICAL DATA:  Trauma. EXAM: CT HEAD WITHOUT CONTRAST CT CERVICAL SPINE WITHOUT CONTRAST TECHNIQUE: Multidetector CT imaging of the head and cervical spine was performed following the standard protocol without intravenous contrast. Multiplanar CT image reconstructions of the cervical spine were also generated. RADIATION DOSE REDUCTION: This exam was performed according to the departmental dose-optimization program which includes automated exposure control, adjustment of the mA and/or kV according to patient size and/or use of iterative reconstruction technique. COMPARISON:  None Available. FINDINGS: CT HEAD FINDINGS Brain: No evidence of  acute infarction, hemorrhage, hydrocephalus, extra-axial collection or mass lesion/mass effect. Vascular: Atherosclerotic calcifications are present within the cavernous internal carotid arteries. Skull: Normal. Negative for fracture or focal lesion. Sinuses/Orbits: There are chronic appearing calcifications in the left globe. No acute abnormalities. Visualized paranasal sinuses and mastoid air cells are clear. Other: None. CT CERVICAL SPINE FINDINGS Alignment: Normal. Skull base and vertebrae: No acute fracture. No primary bone lesion or focal pathologic process. Soft tissues and spinal canal: No prevertebral fluid or swelling. No visible canal hematoma. Disc levels: There is moderate disc space narrowing and endplate osteophyte formation throughout the cervical spine from C3 through T1 compatible with degenerative change. There is moderate severe left-sided neural foraminal stenosis at C3-C4 secondary to facet arthropathy and uncovertebral spurring. There is moderate central canal stenosis at C3-C4,  C4-C5 and C5-C6 secondary to posterior calcified disc bulges. Upper chest: There is some patchy ground-glass opacities in the right lung apex. Other: None. IMPRESSION: 1. No acute intracranial process. 2. No acute fracture or traumatic subluxation of the cervical spine. 3. Patchy ground-glass opacities in the right lung apex may be infectious or inflammatory. Electronically Signed   By: Ronney Asters M.D.   On: 01/27/2022 23:53    Procedures .Critical Care  Performed by: Deno Etienne, DO Authorized by: Deno Etienne, DO   Critical care provider statement:    Critical care time (minutes):  35   Critical care time was exclusive of:  Separately billable procedures and treating other patients   Critical care was time spent personally by me on the following activities:  Development of treatment plan with patient or surrogate, discussions with consultants, evaluation of patient's response to treatment, examination of  patient, ordering and review of laboratory studies, ordering and review of radiographic studies, ordering and performing treatments and interventions, pulse oximetry, re-evaluation of patient's condition and review of old charts   Care discussed with: admitting provider       Medications Ordered in ED Medications  sodium chloride 0.9 % bolus 1,000 mL (0 mLs Intravenous Stopped 01/28/22 0336)  sodium chloride 0.9 % bolus 1,000 mL (0 mLs Intravenous Stopped 01/28/22 0157)    ED Course/ Medical Decision Making/ A&P                           Medical Decision Making Amount and/or Complexity of Data Reviewed Labs: ordered. Radiology: ordered.   76 yo M with a chief complaint of a fall.  Patient lost his balance while ambulating.  He tells me landed on his right side but that his left chest wall hurts.  No obvious pain on palpation.  He tells me he cannot move his hands bilaterally.  I have trouble explaining this clinically.  He is describing paresthesias to bilateral hands which could have been a compressive neuropathy though he denies any specific position that he was stuck in.  He also has no neck pain.  I would consider central cord syndrome though it seems that would be atypical to just have symptoms to the hands.  Will obtain an MRI to further evaluate.  CT of the head and C-spine were negative for acute pathology.  Patient has a significant metabolic acidosis with a bicarb of 16.  He has no renal dysfunction, no anion gap elevation.  He is not complaining of diarrhea.  Will give a bolus of IV fluids.  Check salicylate level EtOH lactate.  Lactic elevated.  MRI concerning for central cord.  Discussed with Dr. Kathyrn Sheriff, neurosurgery.  He felt this could be safely handled as an outpatient and he would see the patient in clinic.  As the patient still has some significant weakness to both of his hands he felt reasonable to discuss with medicine for observation.   The patients results and  plan were reviewed and discussed.   Any x-rays performed were independently reviewed by myself.   Differential diagnosis were considered with the presenting HPI.  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

## 2022-01-28 NOTE — Progress Notes (Addendum)
  Carryover admission to the Day Admitter.  I discussed this case with the EDP, Dr.  Tyrone Nine.  Per these discussions:   This is a  45 M who presented for evaluation of acute weakness in the bilateral hands following ground-level mechanical fall earlier in the day Without loss consciousness, with patient reporting that he did not hit his head.  Is on full dose aspirin as an outpatient.  CT head showed no evidence of acute process.  MRI cervical spine was equivocal for central minor central cord edema at C5/C6, without evidence of acute fracture or soft tissue injury.  EDP discussed patient's case with the on-call neurosurgeon, Dr. Kathyrn Sheriff, Who conveyed that, in the absence of acute fracture, that there is no indication for acute surgical intervention.  Rather, he recommended overnight observation to the hospitalist service for physical therapy, and recommended outpatient follow-up in neurosurgery clinic upon discharge.  Additionally, Dr. Kathyrn Sheriff conveyed that systemic corticosteroids would be unlikely to be of benefit, and specifically recommended against initiation of steroids. Dr.Nundkumar conveyed that neurosurgery will be available for additional consultation, if needed.   I have placed an order for observation for further evaluation/management of the above.  I have placed some additional preliminary admit orders via the adult multi-morbid admission order set. I have also ordered blood precautions, physical therapy/Occupational Therapy consults from this morning.  Every 4 hours neurochecks.    Babs Bertin, DO Hospitalist

## 2022-01-28 NOTE — Progress Notes (Signed)
  NEUROSURGERY PROGRESS NOTE   Full consult to follow. I have spoken with ED personnel and personally reviewed the cervical MRI. Patient fell from standing height and presented with bilateral distal UE weakness clinically c/w central cord type SCI. Imaging reveals mod-severe degenerative cervical stenosis C3-C6 with some spinal cord compression and subtle T2 signal change. No acute fractures seen. Would plan on subacute decompression likely via posterior decompression and instrumented stabilization likely in 10-14 days.   Consuella Lose, MD [facility]

## 2022-01-28 NOTE — Evaluation (Signed)
Occupational Therapy Evaluation Patient Details Name: Samuel [name] MRN: [id number] DOB: 1945/07/27 Today's Date: 01/28/2022   History of Present Illness Pt is a 76 y/o male who presented after a fall at home with development of impaired B hand use. MRI cervical spine showed minor central cord edema at C5-6, stenosis at C3-4 to C6-7 w/ cord indentation. Plan for subacute decompression in 10-14 days ner NSG. PMH: prostate cancer, Hep A, DM, B TKA   Clinical Impression   PTA, pt lives with significant other, typically Modified Independent with ADLs, IADLs, driving and mobility using cane. Pt with impairments in B hand coordination and strength though does endorse some improvements since initial presentation to ED. Overall, pt requires min guard for mobility without AD and Min A for UB/LB ADLs d/t deficits. Provided initial education for UE HEP w/ theraputty, squeeze ball and built up grips provided for utensils. Will continue to follow acutely and anticipate good return of B hand function w/ daily tasks. Rec OP OT follow up at DC.     Recommendations for follow up therapy are one component of a multi-disciplinary discharge planning process, led by the attending physician.  Recommendations may be updated based on patient status, additional functional criteria and insurance authorization.   Follow Up Recommendations  Outpatient OT     Assistance Recommended at Discharge Intermittent Supervision/Assistance  Patient can return home with the following A little help with bathing/dressing/bathroom;Assistance with cooking/housework    Functional Status Assessment  Patient has had a recent decline in their functional status and demonstrates the ability to make significant improvements in function in a reasonable and predictable amount of time.  Equipment Recommendations  None recommended by OT    Recommendations for Other Services       Precautions / Restrictions Precautions Precautions:  Fall Restrictions Weight Bearing Restrictions: No      Mobility Bed Mobility Overal bed mobility: Modified Independent Bed Mobility: Supine to Sit, Sit to Supine                Transfers Overall transfer level: Needs assistance Equipment used: None Transfers: Sit to/from Stand Sit to Stand: Supervision                  Balance Overall balance assessment: Mild deficits observed, not formally tested                                         ADL either performed or assessed with clinical judgement   ADL Overall ADL's : Needs assistance/impaired Eating/Feeding: Minimal assistance;Sitting   Grooming: Minimal assistance;Standing   Upper Body Bathing: Minimal assistance;Sitting   Lower Body Bathing: Minimal assistance;Sit to/from stand   Upper Body Dressing : Minimal assistance   Lower Body Dressing: Minimal assistance   Toilet Transfer: Min guard;Ambulation Toilet Transfer Details (indicate cue type and reason): to/from bathroom without AD Toileting- Clothing Manipulation and Hygiene: Supervision/safety;Sitting/lateral lean;Sit to/from stand Toileting - Clothing Manipulation Details (indicate cue type and reason): able to manage clothing with increased time (needed assist with initial presentation)     Functional mobility during ADLs: Min guard General ADL Comments: Limited by impaired B hand use that has improved since admission     Vision Baseline Vision/History: 2 Legally blind (L eye blind since age 33) Ability to See in Adequate Light: 1 Impaired Patient Visual Report: No change from baseline Vision Assessment?: No apparent visual deficits Additional  Comments: no new changes     Perception     Praxis      Pertinent Vitals/Pain       Hand Dominance Right   Extremity/Trunk Assessment Upper Extremity Assessment Upper Extremity Assessment: RUE deficits/detail;LUE deficits/detail RUE Deficits / Details: impairments > L, barely  able to make full fist. digit opposition impaired. AROM WFL at all other joints. denies any sensation deficits LUE Deficits / Details: able to make light fist, digit opposition slow though intact. AROM WFL at all other joints   Lower Extremity Assessment Lower Extremity Assessment: Defer to PT evaluation   Cervical / Trunk Assessment Cervical / Trunk Assessment: Normal   Communication Communication Communication: No difficulties   Cognition Arousal/Alertness: Awake/alert Behavior During Therapy: WFL for tasks assessed/performed Overall Cognitive Status: Within Functional Limits for tasks assessed                                       General Comments  significant other present, supportive    Exercises     Shoulder Instructions      Home Living Family/patient expects to be discharged to:: Private residence Living Arrangements: Spouse/significant other Available Help at Discharge: Family;Available 24 hours/day Type of Home: House Home Access: Stairs to enter [employer] of Steps: 1   Home Layout: One level     Bathroom Shower/Tub: Walk-in shower;Tub/shower unit   Bathroom Toilet: Standard     Home Equipment: Cane - single point;Shower seat - built in;Grab bars - tub/shower          Prior Functioning/Environment Prior Level of Function : Independent/Modified Independent;Driving             Mobility Comments: using cane more recently due to balance deficits ADLs Comments: Independent with ADLs, shares iADLs with SO, drives, grocery shops        OT Problem List: Decreased strength;Impaired UE functional use;Decreased coordination      OT Treatment/Interventions: Self-care/ADL training;Therapeutic exercise;DME and/or AE instruction;Therapeutic activities    OT Goals(Current goals can be found in the care plan section) Acute Rehab OT Goals Patient Stated Goal: improve B hand use OT Goal Formulation: With patient/family Time For  Goal Achievement: 02/11/22 Potential to Achieve Goals: Good ADL Goals Pt Will Perform Eating: with modified independence;sitting Pt Will Perform Grooming: with modified independence;standing Pt Will Perform Lower Body Dressing: with modified independence;sit to/from stand Pt/caregiver will Perform Home Exercise Program: Increased strength;Increased ROM;Both right and left upper extremity;Independently;With written HEP provided  OT Frequency: Min 2X/week    Co-evaluation              AM-PAC OT "6 Clicks" Daily Activity     Outcome Measure Help from another person eating meals?: A Little Help from another person taking care of personal grooming?: A Little Help from another person toileting, which includes using toliet, bedpan, or urinal?: A Little Help from another person bathing (including washing, rinsing, drying)?: A Little Help from another person to put on and taking off regular upper body clothing?: A Little Help from another person to put on and taking off regular lower body clothing?: A Little 6 Click Score: 18   End of Session Nurse Communication: Mobility status  Activity Tolerance: Patient tolerated treatment well Patient left: in bed;with call bell/phone within reach;with family/visitor present;Other (comment) (lab at bedside)  OT Visit Diagnosis: Muscle weakness (generalized) (M62.81)  Time: 1735-6701 OT Time Calculation (min): 24 min Charges:  OT General Charges $OT Visit: 1 Visit OT Evaluation $OT Eval Low Complexity: 1 Low OT Treatments $Self Care/Home Management : 8-22 mins  Malachy Chamber, OTR/L Acute Rehab Services Office: [phone]   Layla Maw 01/28/2022, 9:45 AM

## 2022-01-28 NOTE — H&P (Signed)
History and Physical    Patient: Samuel [name] QQV:[id number] DOB: Jul 30, 1945 DOA: 01/27/2022 DOS: the patient was seen and examined on 01/28/2022 PCP: [name], NP  Patient coming from: Home  Chief Complaint:  Chief Complaint  Patient presents with   Fall   Chest Pain   HPI: Samuel [name] is a 76 y.o. male with medical history significant of hyperlipidemia, diabetes mellitus type II, remote history of hepatitis A, prostate cancer presents after having a fall at home 2 days ago with complaints of bilateral upper extremity weakness.  He was in the process of taking trash to 1 trash can when he changed his mind and turned around to take it to a different location.  In doing so he lost his balance and fell hitting his head into the door and landing on his right shoulder.  He reported having pain in his shoulders and chest.  Denies any loss of consciousness and he was able to crawl and get back up.  Thereafter he noticed that he was having difficulty moving his fingers and reported having tingling sensation.  Denies back pain nausea, vomiting, diarrhea, fever, or chills.  He does report being a little off balance on his feet and utilizes a cane from time to time when outside of the house.  In the house he normally ambulates without assistance.   In the emergency department patient was noted to be tachypneic with blood pressures elevated up to 183/91.  Labs significant for CBC with differential within normal limits, CO2 16 -18, lactic acid 3.3->4, and anion gap is within normal limits.  X-rays of the ribs on the left noted no signs of acute fracture.  CT scan of the head and cervical spine did not note any acute abnormality, but did make note of patchy ground glass opacities in the right lung apex which may be infectious or inflammatory.  MRI cervical spine was equivocal for central minor central cord edema at C5/C6, without evidence of acute fracture or soft tissue injury.  Dr. Kathyrn Sheriff  neurosurgery was consulted and recommended admission for observation to hospitalist service for physical therapy and outpatient follow-up with neurosurgery in the clinic upon discharge.  Steroids were thought to be of unlikely benefit and actually recommended against initiation.  Patient has been given 2 L of normal saline IV fluids.  Review of Systems: As mentioned in the history of present illness. All other systems reviewed and are negative. Past Medical History:  Diagnosis Date   Arthritis    Blind left eye    Cyst in hand 10/2013   left hand, treated with antibiotic & pain medicine   Diabetes mellitus without complication (HCC)    P3I 7.1% 04/28/20   Heart murmur    Hepatitis A    25 YRS AGO   Hyperlipidemia    Pre-diabetes    Prostate cancer ([place])    Sleep apnea    ????   O2 DESAT  10/26/13  IN ED , no sleep study ever done per pt- no cpap   Past Surgical History:  Procedure Laterality Date   COLONOSCOPY     EYE SURGERY     LEFT AYE AS CHILD    JOINT REPLACEMENT Bilateral    LT KNEE 3 YRS AGO    MOUTH SURGERY  09/24/2020   MULTIPLE EXTRACTIONS WITH ALVEOLOPLASTY N/A 08/21/2020   Procedure: MULTIPLE EXTRACTION WITH ALVEOLOPLASTY;  Surgeon: Charlaine Dalton, DMD;  Location: [facility];  Service: Dentistry;  Laterality: N/A;   TOTAL  KNEE ARTHROPLASTY Right 12/04/2013   dr Rhona Raider   TOTAL KNEE ARTHROPLASTY Right 12/04/2013   Procedure: TOTAL KNEE ARTHROPLASTY;  Surgeon: Hessie Dibble, MD;  Location: [facility];  Service: Orthopedics;  Laterality: Right;   Social History:  reports that he quit smoking about 38 years ago. His smoking use included cigarettes. He has a 2.50 pack-year smoking history. He has never used smokeless tobacco. He reports that he does not drink alcohol and does not use drugs.  Allergies  Allergen Reactions   Other Other (See Comments)    Patient stated,"if I touch felt (material) I get "knots" on my body."   Peach [Prunus Persica] Other (See Comments)     Patient stated,"if I touch or eat peach fuzz, I get "knots" all over my face and mouth."     Family History  Problem Relation Age of Onset   Stroke Mother    Heart attack Mother    Diabetes Mother    Stroke Father    Stomach cancer Brother    Prostate cancer Brother    Throat cancer Sister    Colon cancer Neg Hx    Esophageal cancer Neg Hx    Rectal cancer Neg Hx     Prior to Admission medications   Medication Sig Start Date End Date Taking? Authorizing Provider  aspirin EC 325 MG tablet Take 1 tablet (325 mg total) by mouth daily. 05/29/21   [name], NP  atorvastatin (LIPITOR) 20 MG tablet Take 1 tablet (20 mg total) by mouth every morning. 11/10/21   [name], NP  losartan (COZAAR) 25 MG tablet Take 1 tablet (25 mg total) by mouth daily. 11/27/21   [name], NP  triptorelin (TRELESTAR LA) 11.25 MG injection Inject 11.25 mg into the muscle every 6 (six) months.    [provider]  XTANDI 40 MG tablet Take 4 tablets (193m) by mouth once daily as directed by physician. 01/13/22   SWyatt Portela MD    Physical Exam: Vitals:   01/28/22 0419 01/28/22 0430 01/28/22 0500 01/28/22 0545  BP:  (!) 174/85 (!) 155/84 (!) 170/85  Pulse:  69 74 74  Resp:      Temp: 98 F (36.7 C)     TempSrc: Oral     SpO2:  100% 93% 100%  Weight:      Height:        Constitutional: Elderly male currently in no acute distress Eyes: PERRL, lids and conjunctivae normal ENMT: Mucous membranes are  . Posterior pharynx clear of any exudate or lesions.Normal dentition.  Neck: normal, supple, no masses, no thyromegaly Respiratory: clear to auscultation bilaterally, no wheezing, no crackles. Normal respiratory effort. No accessory muscle use.  Cardiovascular: Regular rate and rhythm, no murmurs / rubs / gallops. No extremity edema. 2+ pedal pulses. No carotid bruits.  Abdomen: no tenderness, no masses palpated. No hepatosplenomegaly. Bowel sounds positive.   Musculoskeletal: no clubbing / cyanosis. No joint deformity upper and lower extremities. Good ROM, no contractures. Normal muscle tone.  Skin: no rashes, lesions, ulcers. No induration Neurologic: CN 2-12 grossly intact. Sensation intact, DTR normal. Strength 5/5 in all 4.  Psychiatric: Normal judgment and insight. Alert and oriented x 3. Normal mood.   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  Patient presents with complaints of bilateral upper extremity weakness after having a fall 2 days ago.  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  Neurosurgery recommended recommended patient to follow-up with plans for subacute decompression likely via posterior decompression and instrumentation stabilization in 10 to 14 days. -Admit to a telemetry bed -Check orthostatic vital signs -PT/OT consulted to evaluate and treat -Follow-up with neurosurgery in am to confirm plan for patient   Lactic acidosis Acute.  Lactic acid Level   3.3->4.  CTA of the chest that noted concern for groundglass opacities in the right lung apex which may be infectious or inflammatory.  Patient had -Check a single blood culture -Check CRP, ESR, and repeat lactic acid levels -Normal saline IV fluids at 75 mL/h  Hypertensive urgency Acute.  Blood pressures initially elevated up to 183/91.  Home medication regimen previously had included losartan 25 mg daily, but patient no longer taking the medication -Hydralazine IV as needed  Mixed Hyperlipidemia -Continue atorvastatin  Prostate cancer Initially diagnosed in 2010 treated with external beam radiation and androgen deprivation.  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  Currently on androgen deprivation under  the care of Dr.  Junious Silk. Xtandi 160 mg daily in May 2018. -Continue [name] -Continue outpatient follow-up  DVT prophylaxis: Lovenox Advance Care Planning:   Code Status: Full Code    Consults: Neurosurgery  Family Communication:   Severity of Illness: The appropriate patient status for this patient is OBSERVATION. Observation status is judged to be reasonable and necessary in order to provide the required intensity of service to ensure the patient's safety. The patient's presenting symptoms, physical exam findings, and initial radiographic and laboratory data in the context of their medical condition is felt to place them at decreased risk for further clinical deterioration. Furthermore, it is anticipated that the patient will be medically stable for discharge from the hospital within 2 midnights of admission.   Author: Norval Morton, MD 01/28/2022 8:29 AM  For on call review [email or web address].

## 2022-01-29 ENCOUNTER — Other Ambulatory Visit: Payer: Self-pay | Admitting: Oncology

## 2022-01-29 DIAGNOSIS — M4802 Spinal stenosis, cervical region: Secondary | ICD-10-CM | POA: Diagnosis not present

## 2022-01-29 DIAGNOSIS — G992 Myelopathy in diseases classified elsewhere: Secondary | ICD-10-CM | POA: Diagnosis not present

## 2022-01-29 DIAGNOSIS — E872 Acidosis, unspecified: Secondary | ICD-10-CM | POA: Diagnosis not present

## 2022-01-29 DIAGNOSIS — C61 Malignant neoplasm of prostate: Secondary | ICD-10-CM

## 2022-01-29 DIAGNOSIS — G9520 Unspecified cord compression: Secondary | ICD-10-CM | POA: Diagnosis not present

## 2022-01-29 DIAGNOSIS — I160 Hypertensive urgency: Secondary | ICD-10-CM | POA: Diagnosis not present

## 2022-01-29 LAB — CBC
HCT: 39.2 % (ref 39.0–52.0)
Hemoglobin: 13 g/dL (ref 13.0–17.0)
MCH: 31.3 pg (ref 26.0–34.0)
MCHC: 33.2 g/dL (ref 30.0–36.0)
MCV: 94.2 fL (ref 80.0–100.0)
Platelets: 246 10*3/uL (ref 150–400)
RBC: 4.16 MIL/uL — ABNORMAL LOW (ref 4.22–5.81)
RDW: 13.8 % (ref 11.5–15.5)
WBC: 5.3 10*3/uL (ref 4.0–10.5)
nRBC: 0 % (ref 0.0–0.2)

## 2022-01-29 NOTE — Progress Notes (Signed)
Discharge instructions reviewed with pt.  Copy of instructions given to pt. Pt has 1 script sent to his out of state pharmacy---pt states med has to come from this pharmacy and will me mailed to him.  (Pt already has some of the medication).  Pt's daughter will be picking him up at main entrance.  Pt d/c'd via wheelchair with belongings, to discharge lounge. Daughter will be here shortly.            Escorted by staff.

## 2022-01-29 NOTE — Plan of Care (Signed)
  Problem: Activity: Goal: Risk for activity intolerance will decrease Outcome: Not Progressing   Problem: Safety: Goal: Ability to remain free from injury will improve Outcome: Not Progressing

## 2022-01-29 NOTE — Discharge Summary (Signed)
Physician Discharge Summary  Samuel [name] SNK:[id number] DOB: 1945-11-28 DOA: 01/27/2022  PCP: [name], NP  Admit date: 01/27/2022 Discharge date: 01/29/2022  Admitted From: Home Disposition: Home  Recommendations for Outpatient Follow-up:  Follow up with PCP in 1 week with repeat CBC/BMP Outpatient follow-up with neurosurgery Follow up in ED if symptoms worsen or new appear   Home Health: No.  Will need outpatient OT Equipment/Devices: None  Discharge Condition: Stable CODE STATUS: Full Diet recommendation: Heart healthy  Brief/Interim Summary: 76 y.o. male with medical history significant of hyperlipidemia, diabetes mellitus type II, remote history of hepatitis A, prostate cancer presented with fall and chest pain.  On presentation, CT scan of the head and cervical spine did not show any acute abnormity.  MRI cervical spine was equivocal for central cord edema at C5-C6 without evidence of fracture or soft tissue injury.  Neurosurgery/Dr. Kathyrn Sheriff recommended outpatient follow-up with neurosurgery for possible surgical intervention.  OT recommended outpatient OT.  Patient is ambulating well and feels okay to go home today.  He will be discharged home today with outpatient follow-up with neurosurgery.  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 -Neurosurgery/Dr. Kathyrn Sheriff recommended outpatient follow-up with neurosurgery for possible surgical intervention.  OT recommended outpatient OT.  Patient is ambulating well and feels okay to go home today.  He will be discharged home today with outpatient follow-up with neurosurgery.  Lactic acidosis -Improved.  No signs of infection.  Monitored off antibiotics  Hypertensive urgency -Improved.  Blood pressure currently on the lower side.  Not taking losartan at home.  Outpatient follow-up with PCP.  Hyperlipidemia Continue statin  Prostate cancer -Continue Xtandi.   Outpatient follow-up with urology   Discharge Instructions  Discharge Instructions     Ambulatory referral to Occupational Therapy   Complete by: As directed    Diet - low sodium heart healthy   Complete by: As directed    Increase activity slowly   Complete by: As directed       Allergies as of 01/29/2022       Reactions   Other Other (See Comments)   Patient stated,"if I touch felt (material) I get "knots" on my body."   Peach [prunus Persica] Other (See Comments)   Patient stated,"if I touch or eat peach fuzz, I get "knots" all over my face and mouth."         Medication List     STOP taking these medications    losartan 25 MG tablet Commonly known as: COZAAR       TAKE these medications    aspirin EC 325 MG tablet Take 1 tablet (325 mg total) by mouth daily.   atorvastatin 20 MG tablet Commonly known as: LIPITOR Take 1 tablet (20 mg total) by mouth every morning.   triptorelin 11.25 MG injection Commonly known as: TRELESTAR LA Inject 11.25 mg into the muscle every 6 (six) months.   Xtandi 40 MG tablet Generic drug: enzalutamide Take 4 tablets ('160mg'$ ) by mouth once daily as directed by physician. What changed: See the new instructions.        Follow-up Information     [name], NP Follow up.   Specialty: Geriatric Medicine Contact information: [place] [place] [phone]         [place] Follow up.   Specialty: Rehabilitation Why: Referral for outpatient occupational therapy made. Please call and setup appointment time. Contact information:  [place]        Consuella Lose, MD. Schedule an appointment as soon as possible for a visit in 1 week(s).   Specialty: Neurosurgery Contact information: [address] [place]  [address] [phone]                 Allergies  Allergen Reactions   Other Other (See Comments)    Patient stated,"if I touch felt (material) I get "knots" on my body."   Peach [Prunus Persica] Other (See Comments)    Patient stated,"if I touch or eat peach fuzz, I get "knots" all over my face and mouth."     Consultations: Neurosurgery   Procedures/Studies: MR Cervical Spine Wo Contrast  Result Date: 01/28/2022 CLINICAL DATA:  Fall yesterday EXAM: MRI CERVICAL SPINE WITHOUT CONTRAST TECHNIQUE: Multiplanar, multisequence MR imaging of the cervical spine was performed. No intravenous contrast was administered. COMPARISON:  None Available. FINDINGS: Alignment: No traumatic malalignment. Mild degenerative anterolisthesis at C7-T1. Vertebrae: No fracture, evidence of discitis, or bone lesion. Cord: Faint T2 hyperintensity at the level of the central cord at C5 and C6, not confidently seen on the other sequences. Posterior Fossa, vertebral arteries, paraspinal tissues: No strain, spurring, or swelling seen. Disc levels: C2-3: Degenerative facet spurring asymmetric to the right C3-4: Disc narrowing and endplate/uncovertebral ridging with asymmetric right facet spurring. Posterior ridging indents the ventral cord. Left more than right foraminal stenosis C4-5: Disc space narrowing and uncovertebral ridging asymmetric to the left where there is foraminal stenosis. Patent spinal canal with mild narrowing C5-6: Disc narrowing with central protrusion indenting the ventral cord. Asymmetric left uncovertebral ridging causing mild or moderate foraminal stenosis C6-7: Disc narrowing with endplate and uncovertebral ridging. Ridging contacts the ventral cord. Mild bilateral foraminal narrowing C7-T1:Degenerative facet spurring asymmetric to the left with mild anterolisthesis. Disc space narrowing. Mild left foraminal stenosis. IMPRESSION: 1. Equivocal for minor central cord edema/contusion at C5 and C6, but only seen on 1 sequence. 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 No occult fracture or soft tissue injury. 2. Multilevel foraminal stenosis described above. Electronically Signed   By: Jorje Guild M.D.   On: 01/28/2022 04:15   DG Ribs Unilateral W/Chest Left  Result Date: 01/27/2022 CLINICAL DATA:  Anterior left rib pain, initial encounter EXAM: LEFT RIBS AND CHEST - 3+ VIEW COMPARISON:  11/27/2021 FINDINGS: Cardiac shadow is within normal limits. Tortuous thoracic aorta is noted with calcifications. The lungs are clear. No discrete rib fracture is seen. No pneumothorax is noted. IMPRESSION: No rib fracture noted. Electronically Signed   By: Inez Catalina M.D.   On: 01/27/2022 23:57   CT Head Wo Contrast  Result Date: 01/27/2022 CLINICAL DATA:  Trauma. EXAM: CT HEAD WITHOUT CONTRAST CT CERVICAL SPINE WITHOUT CONTRAST TECHNIQUE: Multidetector CT imaging of the head and cervical spine was performed following the standard protocol without intravenous contrast. Multiplanar CT image reconstructions of the cervical spine were also generated. RADIATION DOSE REDUCTION: This exam was performed according to the departmental dose-optimization program which includes automated exposure control, adjustment of the mA and/or kV according to patient size and/or use of iterative reconstruction technique. COMPARISON:  None Available. FINDINGS: CT HEAD FINDINGS Brain: No evidence of acute infarction, hemorrhage, hydrocephalus, extra-axial collection or mass lesion/mass effect. Vascular: Atherosclerotic calcifications are present within the cavernous internal carotid arteries. Skull: Normal. Negative for fracture or focal lesion. Sinuses/Orbits:  There are chronic appearing calcifications in the left globe. No acute abnormalities. Visualized paranasal sinuses and mastoid air cells are clear. Other: None. CT CERVICAL SPINE FINDINGS Alignment: Normal. Skull base and vertebrae: No acute fracture. No primary  bone lesion or focal pathologic process. Soft tissues and spinal canal: No prevertebral fluid or swelling. No visible canal hematoma. Disc levels: There is moderate disc space narrowing and endplate osteophyte formation throughout the cervical spine from C3 through T1 compatible with degenerative change. There is moderate severe left-sided neural foraminal stenosis at C3-C4 secondary to facet arthropathy and uncovertebral spurring. There is moderate central canal stenosis at C3-C4, C4-C5 and C5-C6 secondary to posterior calcified disc bulges. Upper chest: There is some patchy ground-glass opacities in the right lung apex. Other: None. IMPRESSION: 1. No acute intracranial process. 2. No acute fracture or traumatic subluxation of the cervical spine. 3. Patchy ground-glass opacities in the right lung apex may be infectious or inflammatory. Electronically Signed   By: Ronney Asters M.D.   On: 01/27/2022 23:53   CT Cervical Spine Wo Contrast  Result Date: 01/27/2022 CLINICAL DATA:  Trauma. EXAM: CT HEAD WITHOUT CONTRAST CT CERVICAL SPINE WITHOUT CONTRAST TECHNIQUE: Multidetector CT imaging of the head and cervical spine was performed following the standard protocol without intravenous contrast. Multiplanar CT image reconstructions of the cervical spine were also generated. RADIATION DOSE REDUCTION: This exam was performed according to the departmental dose-optimization program which includes automated exposure control, adjustment of the mA and/or kV according to patient size and/or use of iterative reconstruction technique. COMPARISON:  None Available. FINDINGS: CT HEAD FINDINGS Brain: No evidence of acute infarction, hemorrhage, hydrocephalus, extra-axial collection or mass lesion/mass effect. Vascular: Atherosclerotic calcifications are present within the cavernous internal carotid arteries. Skull: Normal. Negative for fracture or focal lesion. Sinuses/Orbits: There are chronic appearing calcifications in the left  globe. No acute abnormalities. Visualized paranasal sinuses and mastoid air cells are clear. Other: None. CT CERVICAL SPINE FINDINGS Alignment: Normal. Skull base and vertebrae: No acute fracture. No primary bone lesion or focal pathologic process. Soft tissues and spinal canal: No prevertebral fluid or swelling. No visible canal hematoma. Disc levels: There is moderate disc space narrowing and endplate osteophyte formation throughout the cervical spine from C3 through T1 compatible with degenerative change. There is moderate severe left-sided neural foraminal stenosis at C3-C4 secondary to facet arthropathy and uncovertebral spurring. There is moderate central canal stenosis at C3-C4, C4-C5 and C5-C6 secondary to posterior calcified disc bulges. Upper chest: There is some patchy ground-glass opacities in the right lung apex. Other: None. IMPRESSION: 1. No acute intracranial process. 2. No acute fracture or traumatic subluxation of the cervical spine. 3. Patchy ground-glass opacities in the right lung apex may be infectious or inflammatory. Electronically Signed   By: Ronney Asters M.D.   On: 01/27/2022 23:53      Subjective: Patient seen and examined at bedside.  Denies any overnight fever, nausea, vomiting.  Feels okay to go home today.  Discharge Exam: Vitals:   01/28/22 2029 01/29/22 0830  BP: 133/72 (!) 127/59  Pulse: 72 65  Resp: 18 20  Temp: 97.8 F (36.6 C) 98.7 F (37.1 C)  SpO2: 98% 99%    General: Pt is alert, awake, not in acute distress Cardiovascular: rate controlled, S1/S2 + Respiratory: bilateral decreased breath sounds at bases Abdominal: Soft, NT, ND, bowel sounds + Extremities: no edema, no cyanosis    The results of significant diagnostics from this hospitalization (including imaging, microbiology, ancillary and  laboratory) are listed below for reference.     Microbiology: Recent Results (from the past 240 hour(s))  Culture, blood (single) w Reflex to ID Panel      Status: None (Preliminary result)   Collection Time: 01/28/22  8:40 AM   Specimen: BLOOD  Result Value Ref Range Status   Specimen Description BLOOD SITE NOT SPECIFIED  Final   Special Requests   Final    BOTTLES DRAWN AEROBIC AND ANAEROBIC Blood Culture adequate volume   Culture   Final    NO GROWTH < 24 HOURS Performed at [facility], [address] [place], [place] [address]    Report Status PENDING  Incomplete     Labs: BNP (last 3 results) No results for input(s): "BNP" in the last 8760 hours. Basic Metabolic Panel: Recent Labs  Lab 01/27/22 2320 01/28/22 0534  NA 136 137  K 4.3 4.2  CL 105 108  CO2 16* 18*  GLUCOSE 201* 151*  BUN 16 10  CREATININE 0.92 0.78  CALCIUM 9.1 8.7*   Liver Function Tests: Recent Labs  Lab 01/28/22 0937  AST 23  ALT 17  ALKPHOS 75  BILITOT 0.4  PROT 7.4  ALBUMIN 4.0   No results for input(s): "LIPASE", "AMYLASE" in the last 168 hours. No results for input(s): "AMMONIA" in the last 168 hours. CBC: Recent Labs  Lab 01/27/22 2320 01/28/22 0534 01/29/22 0350  WBC 8.6 7.0 5.3  NEUTROABS 4.0 4.6  --   HGB 14.4 14.4 13.0  HCT 41.0 42.0 39.2  MCV 90.1 93.1 94.2  PLT 289 259 246   Cardiac Enzymes: Recent Labs  Lab 01/28/22 0937  CKTOTAL 99   BNP: Invalid input(s): "POCBNP" CBG: No results for input(s): "GLUCAP" in the last 168 hours. D-Dimer No results for input(s): "DDIMER" in the last 72 hours. Hgb A1c No results for input(s): "HGBA1C" in the last 72 hours. Lipid Profile No results for input(s): "CHOL", "HDL", "LDLCALC", "TRIG", "CHOLHDL", "LDLDIRECT" in the last 72 hours. Thyroid function studies No results for input(s): "TSH", "T4TOTAL", "T3FREE", "THYROIDAB" in the last 72 hours.  Invalid input(s): "FREET3" Anemia work up No results for input(s): "VITAMINB12", "FOLATE", "FERRITIN", "TIBC", "IRON", "RETICCTPCT" in the last 72 hours. Urinalysis    Component Value Date/Time   COLORURINE YELLOW  01/28/2022 0157   APPEARANCEUR CLEAR 01/28/2022 0157   LABSPEC 1.020 01/28/2022 0157   PHURINE 7.0 01/28/2022 0157   GLUCOSEU NEGATIVE 01/28/2022 0157   HGBUR NEGATIVE 01/28/2022 0157   BILIRUBINUR NEGATIVE 01/28/2022 0157   KETONESUR 20 (A) 01/28/2022 0157   PROTEINUR NEGATIVE 01/28/2022 0157   UROBILINOGEN 0.2 11/23/2013 1156   NITRITE NEGATIVE 01/28/2022 0157   LEUKOCYTESUR NEGATIVE 01/28/2022 0157   Sepsis Labs Recent Labs  Lab 01/27/22 2320 01/28/22 0534 01/29/22 0350  WBC 8.6 7.0 5.3   Microbiology Recent Results (from the past 240 hour(s))  Culture, blood (single) w Reflex to ID Panel     Status: None (Preliminary result)   Collection Time: 01/28/22  8:40 AM   Specimen: BLOOD  Result Value Ref Range Status   Specimen Description BLOOD SITE NOT SPECIFIED  Final   Special Requests   Final    BOTTLES DRAWN AEROBIC AND ANAEROBIC Blood Culture adequate volume   Culture   Final    NO GROWTH < 24 HOURS Performed at [facility], [place] [address] [place], [place] [address]    Report Status PENDING  Incomplete     Time coordinating discharge: 30  minutes  SIGNED:   Aline August, MD  [facility] 01/29/2022, 10:14 AM

## 2022-01-29 NOTE — Progress Notes (Signed)
Pt had an IV in left hand, site was d/c'd, cath intact, site unremarkable, CDI.

## 2022-01-29 NOTE — Consult Note (Signed)
Chief Complaint   Chief Complaint  Patient presents with   Fall   Chest Pain    History of Present Illness  Samuel [name] is a 76 y.o. male Presenting to the emergency department after a fall.  He was wearing some house slippers and slipped on his floor, landing on his right shoulder and the side of his head.  Immediately after the fall he noted severe weakness of the [name] bilaterally, with some more mild proximal weakness.  He was admitted through the emergency department after workup included MRI of the cervical spine demonstrating severe degenerative stenosis.  Over the last 36 hours the patient has noted drastic improvement in his symptoms.  He now has nearly normal proximal upper extremity strength, and has regained significant strength in the [name].  He does continue to note some grip strength weakness and some clumsiness.  He does not report any difficulty walking or changes in bladder function.  Of note, the patient does not report any significant medical history of hypertension, previous heart disease or stroke.  The patient does report a diagnosis of prediabetes.  He does not report any known lung, liver, or kidney disease.  He reports a history of prostate cancer without any known metastases.  He does take a full dose aspirin on a daily basis but no other anticoagulation.  Past Medical History   Past Medical History:  Diagnosis Date   Arthritis    Blind left eye    Cyst in hand 10/2013   left hand, treated with antibiotic & pain medicine   Diabetes mellitus without complication (HCC)    W9N 7.1% 04/28/20   Heart murmur    Hepatitis A    25 YRS AGO   Hyperlipidemia    Pre-diabetes    Prostate cancer ([facility])    Sleep apnea    ????   O2 DESAT  10/26/13  IN ED , no sleep study ever done per pt- no cpap    Past Surgical History   Past Surgical History:  Procedure Laterality Date   COLONOSCOPY     EYE SURGERY     LEFT AYE AS CHILD    JOINT REPLACEMENT Bilateral    LT KNEE  3 YRS AGO    MOUTH SURGERY  09/24/2020   MULTIPLE EXTRACTIONS WITH ALVEOLOPLASTY N/A 08/21/2020   Procedure: MULTIPLE EXTRACTION WITH ALVEOLOPLASTY;  Surgeon: Charlaine Dalton, DMD;  Location: [facility];  Service: Dentistry;  Laterality: N/A;   TOTAL KNEE ARTHROPLASTY Right 12/04/2013   dr Rhona Raider   TOTAL KNEE ARTHROPLASTY Right 12/04/2013   Procedure: TOTAL KNEE ARTHROPLASTY;  Surgeon: Hessie Dibble, MD;  Location: [facility];  Service: Orthopedics;  Laterality: Right;    Social History   Social History   Tobacco Use   Smoking status: Former    Packs/day: 0.50    Years: 5.00    Total pack years: 2.50    Types: Cigarettes    Quit date: 02/16/1983    Years since quitting: 38.9   Smokeless tobacco: Never  Vaping Use   Vaping Use: Never used  Substance Use Topics   Alcohol use: No    Alcohol/week: 0.0 standard drinks of alcohol   Drug use: No    Medications   Prior to Admission medications   Medication Sig Start Date End Date Taking? Authorizing Provider  aspirin EC 325 MG tablet Take 1 tablet (325 mg total) by mouth daily. 05/29/21  Yes [name], NP  atorvastatin (LIPITOR) 20 MG tablet  Take 1 tablet (20 mg total) by mouth every morning. 11/10/21  Yes Eubanks, [name], NP  triptorelin (TRELESTAR LA) 11.25 MG injection Inject 11.25 mg into the muscle every 6 (six) months.   Yes [provider]  XTANDI 40 MG tablet Take 4 tablets ('160mg'$ ) by mouth once daily as directed by physician. Patient taking differently: Take 160 mg by mouth daily. 01/13/22  Yes Wyatt Portela, MD  losartan (COZAAR) 25 MG tablet Take 1 tablet (25 mg total) by mouth daily. Patient not taking: Reported on 01/28/2022 11/27/21   [name], NP    Allergies   Allergies  Allergen Reactions   Other Other (See Comments)    Patient stated,"if I touch felt (material) I get "knots" on my body."   Peach [Prunus Persica] Other (See Comments)    Patient stated,"if I touch or eat peach fuzz,  I get "knots" all over my face and mouth."     Review of Systems  ROS  Neurologic Exam  Awake, alert, oriented Memory and concentration grossly intact Speech fluent, appropriate CN grossly intact Motor exam: Upper Extremities Deltoid Bicep Tricep Grip  Right 5/5 5/5 4/5 4-/5  Left 5/5 5/5 4/5 4-/5   Lower Extremities IP Quad PF DF EHL  Right 5/5 5/5 5/5 5/5 5/5  Left 5/5 5/5 5/5 5/5 5/5   Sensation grossly intact to LT  Imaging  MRI of the cervical spine personally reviewed and demonstrates severe degenerative stenosis from C3-4 through about C6-7 with spinal cord compression.  There is subtle T2 signal change within the spinal cord at approximately the C5-6 level.  Impression  - 76 y.o. male Status post fall with clinical central cord syndrome which is rapidly improving.  His imaging does reveal severe underlying degenerative cervical stenosis with spinal cord compression.  I therefore do think that he will require operative decompression on a subacute basis.  Plan  - I will plan on seeing him in the outpatient clinic next week, likely to plan for posterior cervical decompression and fusion after that - Pt appears stable for d/c from a neurosurgical standpoint  I have reviewed the situation including the imaging findings with the patient.  I told him that given his imaging findings and recent spinal cord injury he will require operative decompression.  I told him that this is generally done on a subacute basis.  I will therefore plan on seeing him in the outpatient clinic.  Patient appeared to understand our discussion.  All his questions today were answered.  He is agreeable to our plan above.  Consuella Lose, MD [facility]

## 2022-01-29 NOTE — Progress Notes (Signed)
Occupational Therapy Treatment Patient Details Name: Samuel [name] MRN: [id number] DOB: Jan 10, 1946 Today's Date: 01/29/2022   History of present illness Pt is a 76 y/o male who presented after a fall at home with development of impaired B hand use. MRI cervical spine showed minor central cord edema at C5-6, stenosis at C3-4 to C6-7 w/ cord indentation. Plan for subacute decompression in 10-14 days ner NSG. PMH: prostate cancer, Hep A, DM, B TKA   OT comments  Pt progressing well towards OT goals, reports further slight improvements in B hand function. Focus on fine motor coordination and use of AE to maximize independence with tasks. Pt able to manage preparing toast, opening small packages and self feeding w/ built up grips on utensils. Increased time required to complete some tasks though pt reports "I will get it done". Provided additional HEPs for fine motor coordination and AROM of digits. Continue to rec OP OT follow up at DC to progress B hand function.    Recommendations for follow up therapy are one component of a multi-disciplinary discharge planning process, led by the attending physician.  Recommendations may be updated based on patient status, additional functional criteria and insurance authorization.    Follow Up Recommendations  Outpatient OT     Assistance Recommended at Discharge Set up Supervision/Assistance  Patient can return home with the following  A little help with bathing/dressing/bathroom;Assistance with cooking/housework   Equipment Recommendations  None recommended by OT    Recommendations for Other Services      Precautions / Restrictions Precautions Precautions: Fall Restrictions Weight Bearing Restrictions: No       Mobility Bed Mobility               General bed mobility comments: EOB on enry    Transfers                         Balance Overall balance assessment: Mild deficits observed, not formally tested                                          ADL either performed or assessed with clinical judgement   ADL Overall ADL's : Needs assistance/impaired Eating/Feeding: Modified independent;Sitting Eating/Feeding Details (indicate cue type and reason): EOB w/ breakfast on entry. pt using built up grips on utensils with success. with increased time, pt able to open butter and jelly packaging, place jelly on toast (though difficult). pt reports " i will get it done somehow)   Grooming Details (indicate cue type and reason): provided toothbrush for use after breakfast and educated onuse of built up  grip on toothbrush, pop open toothpaste tubes if possible           Upper Body Dressing Details (indicate cue type and reason): discussed use of pullover shirts vs button ups; strategies for this                   General ADL Comments: Provided additional handouts for fine motor coordination, AROM of digits and additional built up grips    Extremity/Trunk Assessment Upper Extremity Assessment Upper Extremity Assessment: RUE deficits/detail;LUE deficits/detail RUE Deficits / Details: impairments > L, able to make full fist. digit opposition impaired. AROM WFL at all other joints. denies any sensation deficits. RUE Coordination: decreased fine motor LUE Deficits / Details: able to make light fist, digit opposition slow  though intact. AROM WFL at all other joints LUE Coordination: decreased fine motor   Lower Extremity Assessment Lower Extremity Assessment: Defer to PT evaluation        Vision   Vision Assessment?: No apparent visual deficits   Perception     Praxis      Cognition Arousal/Alertness: Awake/alert Behavior During Therapy: WFL for tasks assessed/performed Overall Cognitive Status: Within Functional Limits for tasks assessed                                          Exercises      Shoulder Instructions       General Comments Pt reporting hesitant  for surgery, may just try outpatient therapy first for his hands    Pertinent Vitals/ Pain       Pain Assessment Pain Assessment: No/denies pain  Home Living                                          Prior Functioning/Environment              Frequency  Min 2X/week        Progress Toward Goals  OT Goals(current goals can now be found in the care plan section)  Progress towards OT goals: Progressing toward goals  Acute Rehab OT Goals Patient Stated Goal: avoid surgery if possible OT Goal Formulation: With patient/family Time For Goal Achievement: 02/11/22 Potential to Achieve Goals: Good ADL Goals Pt Will Perform Eating: with modified independence;sitting Pt Will Perform Grooming: with modified independence;standing Pt Will Perform Lower Body Dressing: with modified independence;sit to/from stand Pt/caregiver will Perform Home Exercise Program: Increased strength;Increased ROM;Both right and left upper extremity;Independently;With written HEP provided  Plan Discharge plan remains appropriate    Co-evaluation                 AM-PAC OT "6 Clicks" Daily Activity     Outcome Measure   Help from another person eating meals?: None Help from another person taking care of personal grooming?: None Help from another person toileting, which includes using toliet, bedpan, or urinal?: None Help from another person bathing (including washing, rinsing, drying)?: A Little Help from another person to put on and taking off regular upper body clothing?: A Little Help from another person to put on and taking off regular lower body clothing?: A Little 6 Click Score: 21    End of Session    OT Visit Diagnosis: Muscle weakness (generalized) (M62.81)   Activity Tolerance Patient tolerated treatment well   Patient Left in bed;with call bell/phone within reach   Nurse Communication Mobility status        Time: 3382-5053 OT Time Calculation (min): 11  min  Charges: OT General Charges $OT Visit: 1 Visit OT Treatments $Self Care/Home Management : 8-22 mins  Malachy Chamber, OTR/L Acute Rehab Services Office: [phone]   Layla Maw 01/29/2022, 7:48 AM

## 2022-02-01 DIAGNOSIS — Z6837 Body mass index (BMI) 37.0-37.9, adult: Secondary | ICD-10-CM | POA: Diagnosis not present

## 2022-02-01 DIAGNOSIS — M4802 Spinal stenosis, cervical region: Secondary | ICD-10-CM | POA: Diagnosis not present

## 2022-02-01 DIAGNOSIS — G992 Myelopathy in diseases classified elsewhere: Secondary | ICD-10-CM | POA: Diagnosis not present

## 2022-02-02 ENCOUNTER — Telehealth: Payer: Self-pay | Admitting: *Deleted

## 2022-02-02 ENCOUNTER — Telehealth: Payer: Self-pay

## 2022-02-02 LAB — CULTURE, BLOOD (SINGLE)
Culture: NO GROWTH
Special Requests: ADEQUATE

## 2022-02-02 NOTE — Progress Notes (Signed)
  Care Coordination  Note  02/02/2022 Name: Samuel [name] MRN: [id number] DOB: 04-30-1945  Samuel [name] is a 76 y.o. year old primary care patient of [name], NP. I reached out to Gerald Leitz by phone today to assist with scheduling a follow up appointment. Samuel [name] verbally consented to my assistance.       Follow up plan: Hospital Follow Up appointment scheduled with Sherrie Mustache) on (02/17/2022) at (240pm). Pt requested afternoon appointment  Julian Hy, [place] Direct Dial: [phone]

## 2022-02-02 NOTE — Patient Outreach (Signed)
  Care Coordination TOC Note Transition Care Management Follow-up Telephone Call Date of discharge and from where: 01/29/22-[facility]  Dx: "central cord syndrome" How have you been since you were released from the hospital? Patient states he is doing well.He has had no pain. He voices that he is getting around-ambulating without any issues. Patient shares that he saw surgeon yesterday. He has decided to hold off on having neck surgery for now as he is concerned about recovery and the pain. He voices that he was also supposed to have outpt rehab. He called office this morning and cancelled appt. He does not feel like he needs services right now.  Any questions or concerns? No  Items Reviewed: Did the pt receive and understand the discharge instructions provided? Yes  Medications obtained and verified? Yes  Other? Yes  Any new allergies since your discharge? No  Dietary orders reviewed? Yes Do you have support at home? Yes   Home Care and Equipment/Supplies: Were home health services ordered? not applicable If so, what is the name of the agency? N/A  Has the agency set up a time to come to the patient's home? not applicable Were any new equipment or medical supplies ordered?  No What is the name of the medical supply agency? N/A Were you able to get the supplies/equipment? not applicable Do you have any questions related to the use of the equipment or supplies? No  Functional Questionnaire: (I = Independent and D = Dependent) ADLs: A  Bathing/Dressing- A  Meal Prep- A  Eating- I  Maintaining continence- I  Transferring/Ambulation- I  Managing Meds- I  Follow up appointments reviewed:  PCP Hospital f/u appt confirmed? No Agreeable to care guide assisting with scheduling appt. [facility] f/u appt confirmed? Yes  patient state he saw Dr. Kathyrn Sheriff on yesterday.. Are transportation arrangements needed? No  If their condition worsens, is the pt aware to call PCP or go  to the Emergency Dept.? Yes Was the patient provided with contact information for the PCP's office or ED? Yes Was to pt encouraged to call back with questions or concerns? Yes  SDOH assessments and interventions completed:   Yes SDOH Interventions Today    Flowsheet Row Most Recent Value  SDOH Interventions   Food Insecurity Interventions Intervention Not Indicated  Transportation Interventions Intervention Not Indicated       Care Coordination Interventions:  PCP follow up appointment requested Education provided    Encounter Outcome:  Pt. Visit Completed    Enzo Montgomery, RN,BSN,CCM [facility] Telephonic Care Management Coordinator Direct Phone: [phone] Toll Free: [phone] Fax: [phone]

## 2022-02-03 ENCOUNTER — Other Ambulatory Visit: Payer: Self-pay | Admitting: Oncology

## 2022-02-03 ENCOUNTER — Encounter: Payer: Medicare HMO | Admitting: Occupational Therapy

## 2022-02-03 DIAGNOSIS — C61 Malignant neoplasm of prostate: Secondary | ICD-10-CM

## 2022-02-16 ENCOUNTER — Encounter: Payer: Self-pay | Admitting: Nurse Practitioner

## 2022-02-17 ENCOUNTER — Encounter: Payer: Self-pay | Admitting: Nurse Practitioner

## 2022-02-17 ENCOUNTER — Ambulatory Visit (INDEPENDENT_AMBULATORY_CARE_PROVIDER_SITE_OTHER): Payer: Medicare HMO | Admitting: Nurse Practitioner

## 2022-02-17 VITALS — BP 132/84 | HR 71 | Temp 97.8°F | Ht 69.0 in | Wt 247.0 lb

## 2022-02-17 DIAGNOSIS — W19XXXD Unspecified fall, subsequent encounter: Secondary | ICD-10-CM | POA: Diagnosis not present

## 2022-02-17 DIAGNOSIS — I10 Essential (primary) hypertension: Secondary | ICD-10-CM | POA: Diagnosis not present

## 2022-02-17 DIAGNOSIS — G9520 Unspecified cord compression: Secondary | ICD-10-CM | POA: Diagnosis not present

## 2022-02-17 NOTE — Progress Notes (Signed)
Careteam: Patient Care Team: Samuel Chandler, NP as PCP - General (Geriatric Medicine) Samuel Aloe, MD as Consulting Physician (Urology)  PLACE OF SERVICE:  [place] Directive information Does Patient Have a Medical Advance Directive?: No, Would patient like information on creating a medical advance directive?: Yes (MAU/Ambulatory/Procedural Areas - Information given) (Papers given at a previous visit)  Allergies  Allergen Reactions   Other Other (See Comments)    Patient stated,"if I touch felt (material) I get "knots" on my body."   Peach [Prunus Persica] Other (See Comments)    Patient stated,"if I touch or eat peach fuzz, I get "knots" all over my face and mouth."     Chief Complaint  Patient presents with   Hospitalization Follow-up    Follow-up from 01/27/22-01/29/22 hospital stay. Moderate fall risk. Patient denies receiving any vaccines since last visit.       HPI: Patient is a 77 y.o. male for hospital follow up.  He reports his feet came out from under him because he changed positions quickly and he feel.  He pulled himself up and was okay but the next day he was having a lot of pain, numbness in his fingers- could not move them. His stepdaughter drove him to the hospital.   He reports now he can moved and work his hands but does not have much strength.  States he followed up with neurosurgery last Wednesday and the doctor was wanting to do surgery on the neck but wants to hold off. He was told there is a chance it does not even work.  Reports he has exercises to do at home and every day its getting better.  States he was told to do PT but wants to hold off on that for now as he is getting better.   No pain at this time.   Review of Systems:  Review of Systems  Constitutional:  Negative for chills, fever and weight loss.  HENT:  Negative for tinnitus.   Respiratory:  Negative for cough, sputum production and shortness of breath.    Cardiovascular:  Negative for chest pain, palpitations and leg swelling.  Gastrointestinal:  Negative for abdominal pain, constipation, diarrhea and heartburn.  Genitourinary:  Negative for dysuria, frequency and urgency.  Musculoskeletal:  Negative for back pain, falls, joint pain and myalgias.  Skin: Negative.   Neurological:  Positive for weakness. Negative for dizziness and headaches.  Psychiatric/Behavioral:  Negative for depression and memory loss. The patient does not have insomnia.     Past Medical History:  Diagnosis Date   Arthritis    Blind left eye    Cyst in hand 10/2013   left hand, treated with antibiotic & pain medicine   Diabetes mellitus without complication (HCC)    Z6W 7.1% 04/28/20   Heart murmur    Hepatitis A    25 YRS AGO   Hyperlipidemia    Pre-diabetes    Prostate cancer ([place])    Sleep apnea    ????   O2 DESAT  10/26/13  IN ED , no sleep study ever done per pt- no cpap   Past Surgical History:  Procedure Laterality Date   COLONOSCOPY     EYE SURGERY     LEFT AYE AS CHILD    JOINT REPLACEMENT Bilateral    LT KNEE 3 YRS AGO    MOUTH SURGERY  09/24/2020   MULTIPLE EXTRACTIONS WITH ALVEOLOPLASTY N/A 08/21/2020   Procedure: MULTIPLE EXTRACTION WITH ALVEOLOPLASTY;  Surgeon: Charlaine Dalton, DMD;  Location: [facility];  Service: Dentistry;  Laterality: N/A;   TOTAL KNEE ARTHROPLASTY Right 12/04/2013   dr Rhona Raider   TOTAL KNEE ARTHROPLASTY Right 12/04/2013   Procedure: TOTAL KNEE ARTHROPLASTY;  Surgeon: Hessie Dibble, MD;  Location: [facility];  Service: Orthopedics;  Laterality: Right;   Social History:   reports that he quit smoking about 39 years ago. His smoking use included cigarettes. He has a 2.50 pack-year smoking history. He has never used smokeless tobacco. He reports that he does not drink alcohol and does not use drugs.  Family History  Problem Relation Age of Onset   Stroke Mother    Heart attack Mother    Diabetes Mother    Stroke Father     Stomach cancer Brother    Prostate cancer Brother    Throat cancer Sister    Colon cancer Neg Hx    Esophageal cancer Neg Hx    Rectal cancer Neg Hx     Medications: Patient's Medications  New Prescriptions   No medications on file  Previous Medications   ASPIRIN EC 325 MG TABLET    Take 1 tablet (325 mg total) by mouth daily.   ATORVASTATIN (LIPITOR) 20 MG TABLET    Take 1 tablet (20 mg total) by mouth every morning.   TRIPTORELIN (TRELESTAR LA) 11.25 MG INJECTION    Inject 11.25 mg into the muscle every 6 (six) months.   XTANDI 40 MG TABLET    Take 4 tablets ('160mg'$ ) by mouth once daily as directed by physician.  Modified Medications   No medications on file  Discontinued Medications   No medications on file    Physical Exam:  Vitals:   02/17/22 1430  BP: 132/84  Pulse: 71  Temp: 97.8 F (36.6 C)  TempSrc: Temporal  SpO2: 97%  Weight: 247 lb (112 kg)  Height: '5\' 9"'$  (1.753 m)   Body mass index is 36.48 kg/m. Wt Readings from Last 3 Encounters:  02/17/22 247 lb (112 kg)  01/29/22 241 lb 13.5 oz (109.7 kg)  01/06/22 247 lb 8 oz (112.3 kg)    Physical Exam Constitutional:      General: He is not in acute distress.    Appearance: He is well-developed. He is not diaphoretic.  HENT:     Head: Normocephalic and atraumatic.     Right Ear: External ear normal.     Left Ear: External ear normal.     Mouth/Throat:     Pharynx: No oropharyngeal exudate.  Eyes:     Conjunctiva/sclera: Conjunctivae normal.     Pupils: Pupils are equal, round, and reactive to light.  Cardiovascular:     Rate and Rhythm: Normal rate and regular rhythm.     Heart sounds: Normal heart sounds.  Pulmonary:     Effort: Pulmonary effort is normal.     Breath sounds: Normal breath sounds.  Abdominal:     General: Bowel sounds are normal.     Palpations: Abdomen is soft.  Musculoskeletal:        General: No tenderness.     Cervical back: Normal range of motion and neck supple.     Right  lower leg: No edema.     Left lower leg: No edema.  Skin:    General: Skin is warm and dry.  Neurological:     Mental Status: He is alert and oriented to person, place, and time.     Comments: Able to grip  but strength weak bilaterally     Labs reviewed: Basic Metabolic Panel: Recent Labs    12/30/21 1210 01/27/22 2320 01/28/22 0534  NA 137 136 137  K 4.1 4.3 4.2  CL 107 105 108  CO2 21* 16* 18*  GLUCOSE 110* 201* 151*  BUN '17 16 10  '$ CREATININE 0.85 0.92 0.78  CALCIUM 9.7 9.1 8.7*   Liver Function Tests: Recent Labs    08/19/21 0934 11/27/21 0901 12/30/21 1210 01/28/22 0937  AST 14* 16 14* 23  ALT '10 11 11 17  '$ ALKPHOS 101  --  85 75  BILITOT 0.4 0.4 0.5 0.4  PROT 7.2 6.8 7.5 7.4  ALBUMIN 4.0  --  4.3 4.0   No results for input(s): "LIPASE", "AMYLASE" in the last 8760 hours. No results for input(s): "AMMONIA" in the last 8760 hours. CBC: Recent Labs    12/30/21 1210 01/27/22 2320 01/28/22 0534 01/29/22 0350  WBC 5.5 8.6 7.0 5.3  NEUTROABS 2.3 4.0 4.6  --   HGB 14.0 14.4 14.4 13.0  HCT 40.2 41.0 42.0 39.2  MCV 91.6 90.1 93.1 94.2  PLT 244 289 259 246   Lipid Panel: Recent Labs    11/27/21 0901  CHOL 178  HDL 82  LDLCALC 82  TRIG 63  CHOLHDL 2.2   TSH: No results for input(s): "TSH" in the last 8760 hours. A1C: Lab Results  Component Value Date   HGBA1C 6.7 (H) 11/27/2021   MR Cervical Spine Wo Contrast  Result Date: 01/28/2022 CLINICAL DATA:  Fall yesterday EXAM: MRI CERVICAL SPINE WITHOUT CONTRAST TECHNIQUE: Multiplanar, multisequence MR imaging of the cervical spine was performed. No intravenous contrast was administered. COMPARISON:  None Available. FINDINGS: Alignment: No traumatic malalignment. Mild degenerative anterolisthesis at C7-T1. Vertebrae: No fracture, evidence of discitis, or bone lesion. Cord: Faint T2 hyperintensity at the level of the central cord at C5 and C6, not confidently seen on the other sequences. Posterior Fossa,  vertebral arteries, paraspinal tissues: No strain, spurring, or swelling seen. Disc levels: C2-3: Degenerative facet spurring asymmetric to the right C3-4: Disc narrowing and endplate/uncovertebral ridging with asymmetric right facet spurring. Posterior ridging indents the ventral cord. Left more than right foraminal stenosis C4-5: Disc space narrowing and uncovertebral ridging asymmetric to the left where there is foraminal stenosis. Patent spinal canal with mild narrowing C5-6: Disc narrowing with central protrusion indenting the ventral cord. Asymmetric left uncovertebral ridging causing mild or moderate foraminal stenosis C6-7: Disc narrowing with endplate and uncovertebral ridging. Ridging contacts the ventral cord. Mild bilateral foraminal narrowing C7-T1:Degenerative facet spurring asymmetric to the left with mild anterolisthesis. Disc space narrowing. Mild left foraminal stenosis. IMPRESSION: 1. Equivocal for minor central cord edema/contusion at C5 and C6, but only seen on 1 sequence. 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 No occult fracture or soft tissue injury. 2. Multilevel foraminal stenosis described above. Electronically Signed   By: Jorje Guild M.D.   On: 01/28/2022 04:15   DG Ribs Unilateral W/Chest Left  Result Date: 01/27/2022 CLINICAL DATA:  Anterior left rib pain, initial encounter EXAM: LEFT RIBS AND CHEST - 3+ VIEW COMPARISON:  11/27/2021 FINDINGS: Cardiac shadow is within normal limits. Tortuous thoracic aorta is noted with calcifications. The lungs are clear. No discrete rib fracture is seen. No pneumothorax is noted. IMPRESSION: No rib fracture noted. Electronically Signed   By: Inez Catalina M.D.   On: 01/27/2022 23:57   CT Head Wo Contrast  Result Date: 01/27/2022 CLINICAL DATA:  Trauma. EXAM: CT HEAD WITHOUT CONTRAST CT CERVICAL SPINE WITHOUT CONTRAST TECHNIQUE: Multidetector CT imaging of the head and cervical spine  was performed following the standard protocol without intravenous contrast. Multiplanar CT image reconstructions of the cervical spine were also generated. RADIATION DOSE REDUCTION: This exam was performed according to the departmental dose-optimization program which includes automated exposure control, adjustment of the mA and/or kV according to patient size and/or use of iterative reconstruction technique. COMPARISON:  None Available. FINDINGS: CT HEAD FINDINGS Brain: No evidence of acute infarction, hemorrhage, hydrocephalus, extra-axial collection or mass lesion/mass effect. Vascular: Atherosclerotic calcifications are present within the cavernous internal carotid arteries. Skull: Normal. Negative for fracture or focal lesion. Sinuses/Orbits: There are chronic appearing calcifications in the left globe. No acute abnormalities. Visualized paranasal sinuses and mastoid air cells are clear. Other: None. CT CERVICAL SPINE FINDINGS Alignment: Normal. Skull base and vertebrae: No acute fracture. No primary bone lesion or focal pathologic process. Soft tissues and spinal canal: No prevertebral fluid or swelling. No visible canal hematoma. Disc levels: There is moderate disc space narrowing and endplate osteophyte formation throughout the cervical spine from C3 through T1 compatible with degenerative change. There is moderate severe left-sided neural foraminal stenosis at C3-C4 secondary to facet arthropathy and uncovertebral spurring. There is moderate central canal stenosis at C3-C4, C4-C5 and C5-C6 secondary to posterior calcified disc bulges. Upper chest: There is some patchy ground-glass opacities in the right lung apex. Other: None. IMPRESSION: 1. No acute intracranial process. 2. No acute fracture or traumatic subluxation of the cervical spine. 3. Patchy ground-glass opacities in the right lung apex may be infectious or inflammatory. Electronically Signed   By: Ronney Asters M.D.   On: 01/27/2022 23:53   CT  Cervical Spine Wo Contrast  Result Date: 01/27/2022 CLINICAL DATA:  Trauma. EXAM: CT HEAD WITHOUT CONTRAST CT CERVICAL SPINE WITHOUT CONTRAST TECHNIQUE: Multidetector CT imaging of the head and cervical spine was performed following the standard protocol without intravenous contrast. Multiplanar CT image reconstructions of the cervical spine were also generated. RADIATION DOSE REDUCTION: This exam was performed according to the departmental dose-optimization program which includes automated exposure control, adjustment of the mA and/or kV according to patient size and/or use of iterative reconstruction technique. COMPARISON:  None Available. FINDINGS: CT HEAD FINDINGS Brain: No evidence of acute infarction, hemorrhage, hydrocephalus, extra-axial collection or mass lesion/mass effect. Vascular: Atherosclerotic calcifications are present within the cavernous internal carotid arteries. Skull: Normal. Negative for fracture or focal lesion. Sinuses/Orbits: There are chronic appearing calcifications in the left globe. No acute abnormalities. Visualized paranasal sinuses and mastoid air cells are clear. Other: None. CT CERVICAL SPINE FINDINGS Alignment: Normal. Skull base and vertebrae: No acute fracture. No primary bone lesion or focal pathologic process. Soft tissues and spinal canal: No prevertebral fluid or swelling. No visible canal hematoma. Disc levels: There is moderate disc space narrowing and endplate osteophyte formation throughout the cervical spine from C3 through T1 compatible with degenerative change. There is moderate severe left-sided neural foraminal stenosis at C3-C4 secondary to facet arthropathy and uncovertebral spurring. There is moderate central canal stenosis at C3-C4, C4-C5 and C5-C6 secondary to posterior calcified disc bulges. Upper chest: There is some patchy ground-glass opacities in the right lung apex. Other: None. IMPRESSION: 1. No acute intracranial process. 2. No acute fracture or  traumatic subluxation of the cervical spine. 3. Patchy ground-glass opacities in the right lung apex may be infectious or inflammatory. Electronically Signed   By:  Ronney Asters M.D.   On: 01/27/2022 23:53     Assessment/Plan 1. Essential hypertension Well controlled at this time.  2. Cervical cord compression with myelopathy (HCC) Noted after fall, he has followed up with neurosurgery but does not wish to have any surgical intervention at this time. He continues exercises that he was taught in the hospital. Does not wish to have any other PT as he is getting stronger daily. Educated to seek immediate medical attention for worsening of symptoms   3. Fall, subsequent encounter Fall precautions discussed    Return in about 6 months (around 08/18/2022) for routine follow up . Samuel [name]. [place], [facility] [phone]

## 2022-02-19 NOTE — Telephone Encounter (Signed)
Oral Oncology Patient Advocate Encounter   Submitted Medicare Extra Help denial letter to [employer] via online portal.  Application for re-enrollment now pending.  I will continue to follow until final determination.  Lady Deutscher, CPhT-Adv Oncology Pharmacy Patient [place] Direct Number: [phone]  Fax: [phone]

## 2022-02-23 NOTE — Telephone Encounter (Signed)
Oral Oncology Patient Advocate Encounter   Received notification re-enrollment for assistance for Xtandi through [name] has been approved. Patient may continue to receive their medication at $0 from this program.    [place] phone number [phone]   Effective dates: 02/15/22 through 02/15/23  I have spoken to the patient.  Samuel [name], CPhT-Adv Oncology Pharmacy Patient [place] Direct Number: [phone]  Fax: [phone]

## 2022-05-03 ENCOUNTER — Telehealth: Payer: Self-pay | Admitting: Hematology and Oncology

## 2022-05-03 NOTE — Telephone Encounter (Signed)
Per 3/18 IB reached out patient, patient is going to let Dr Estrich[sp] handle everything.

## 2022-05-05 ENCOUNTER — Inpatient Hospital Stay: Payer: Medicare HMO

## 2022-05-05 ENCOUNTER — Inpatient Hospital Stay: Payer: Medicare HMO | Admitting: Hematology and Oncology

## 2022-05-21 ENCOUNTER — Other Ambulatory Visit: Payer: Self-pay

## 2022-05-21 DIAGNOSIS — E782 Mixed hyperlipidemia: Secondary | ICD-10-CM

## 2022-05-21 MED ORDER — ATORVASTATIN CALCIUM 20 MG PO TABS: 20.0000 mg | ORAL_TABLET | Freq: Every morning | ORAL | 1 refills | Status: DC

## 2022-05-21 NOTE — Telephone Encounter (Signed)
Patient request refill

## 2022-05-28 ENCOUNTER — Other Ambulatory Visit: Payer: Self-pay | Admitting: Pharmacist

## 2022-05-28 NOTE — Progress Notes (Signed)
   05/28/2022 Name: Samuel [name] MRN: [id number] DOB: 1945/02/18    Samuel [name] is a 77 y.o. year old male who presented for a telephone visit.   They were referred to the pharmacist by a quality report for assistance in managing  quality metrics: control of blood pressure (CBP) and med adherence hypertension [facility]) .    Subjective:  Care Team: Primary Care Provider: Sharon Seller, NP  Medication Access/Adherence  Current Pharmacy:  [name] - [address] ([place]), [place] - [address] [place]) [place] [address] Phone: [phone] Fax: [phone]  [facility] - [place], [place] - [address] [place] [address] Phone: [phone] Fax: [phone]   Patient reports affordability concerns with their medications: No  Patient reports access/transportation concerns to their pharmacy: No  Patient reports adherence concerns with their medications:  No     Hypertension:  Current medications: none at present, patient states he stopped taking them and that he does not have high blood pressure  Patient has a validated, automated, upper arm home BP cuff Current blood pressure readings readings: 130s systolic   Objective:  Lab Results  Component Value Date   HGBA1C 6.7 (H) 11/27/2021    Lab Results  Component Value Date   CREATININE 0.78 01/28/2022   BUN 10 01/28/2022   NA 137 01/28/2022   K 4.2 01/28/2022   CL 108 01/28/2022   CO2 18 (L) 01/28/2022    Lab Results  Component Value Date   CHOL 178 11/27/2021   HDL 82 11/27/2021   LDLCALC 82 11/27/2021   TRIG 63 11/27/2021   CHOLHDL 2.2 11/27/2021    Medications Reviewed Today     Reviewed by Gabriel Carina, RPH (Pharmacist) on 05/28/22 at 1043  Med List Status: <None>   Medication Order Taking? Sig Documenting Provider Last Dose Status Informant  aspirin EC 325 MG tablet [id number] No Take 1 tablet  (325 mg total) by mouth daily. Sharon Seller, NP Taking Active Multiple Informants  atorvastatin (LIPITOR) 20 MG tablet [id number]  Take 1 tablet (20 mg total) by mouth every morning. Sharon Seller, NP  Active   triptorelin (TRELESTAR LA) 11.25 MG injection [id number] No Inject 11.25 mg into the muscle every 6 (six) months. [provider] Taking Active Multiple Informants  XTANDI 40 MG tablet [id number] No Take 4 tablets (160mg ) by mouth once daily as directed by physician. Benjiman Core, MD Taking Active               Assessment/Plan:   Hypertension: - Currently controlled without medication. Reviewed fill history and confirmed usage with patient, no concerns identified at this time - Recommend to continue current regimen   Lynnda Shields, PharmD, BCPS Clinical Pharmacist [facility]

## 2022-06-30 DIAGNOSIS — M8580 Other specified disorders of bone density and structure, unspecified site: Secondary | ICD-10-CM | POA: Diagnosis not present

## 2022-07-21 DIAGNOSIS — M8580 Other specified disorders of bone density and structure, unspecified site: Secondary | ICD-10-CM | POA: Diagnosis not present

## 2022-07-21 DIAGNOSIS — C61 Malignant neoplasm of prostate: Secondary | ICD-10-CM | POA: Diagnosis not present

## 2022-07-21 DIAGNOSIS — R3912 Poor urinary stream: Secondary | ICD-10-CM | POA: Diagnosis not present

## 2022-08-18 ENCOUNTER — Encounter: Payer: Self-pay | Admitting: Nurse Practitioner

## 2022-08-18 ENCOUNTER — Ambulatory Visit (INDEPENDENT_AMBULATORY_CARE_PROVIDER_SITE_OTHER): Payer: Medicare HMO | Admitting: Nurse Practitioner

## 2022-08-18 VITALS — BP 136/90 | HR 73 | Temp 97.5°F | Resp 18 | Ht 69.0 in | Wt 245.0 lb

## 2022-08-18 DIAGNOSIS — E1141 Type 2 diabetes mellitus with diabetic mononeuropathy: Secondary | ICD-10-CM

## 2022-08-18 DIAGNOSIS — C61 Malignant neoplasm of prostate: Secondary | ICD-10-CM | POA: Diagnosis not present

## 2022-08-18 DIAGNOSIS — I10 Essential (primary) hypertension: Secondary | ICD-10-CM | POA: Diagnosis not present

## 2022-08-18 DIAGNOSIS — I700 Atherosclerosis of aorta: Secondary | ICD-10-CM

## 2022-08-18 DIAGNOSIS — E782 Mixed hyperlipidemia: Secondary | ICD-10-CM | POA: Diagnosis not present

## 2022-08-18 MED ORDER — ATORVASTATIN CALCIUM 20 MG PO TABS: 20.0000 mg | ORAL_TABLET | Freq: Every morning | ORAL | 1 refills | Status: DC

## 2022-08-18 NOTE — Progress Notes (Signed)
Careteam: Patient Care Team: Sharon Seller, NP as PCP - General (Geriatric Medicine) Jerilee Field, MD as Consulting Physician (Urology)  PLACE OF SERVICE:  [facility]  Advanced Directive information    Allergies  Allergen Reactions   Other Other (See Comments)    Patient stated,"if I touch felt (material) I get "knots" on my body."   Peach [Prunus Persica] Other (See Comments)    Patient stated,"if I touch or eat peach fuzz, I get "knots" all over my face and mouth."     Chief Complaint  Patient presents with   Medical Management of Chronic Issues    Patient is here for a follow up for chronic conditions Discuss need for diabetic screening and updated vaccines      HPI: Patient is a 77 y.o. male for routine follow up.  Has been doing well since last visit.  Continues to go urologist due to prostate cancer.  Continues on xtandi and treslestar injection. PSA has been stable.   Reports he checks his blood pressure at home and is not high- does not remember the readings.  It was recommended to start losartan but did not get medication.   No chest pains, shortness of breath, LE edema. No pain.  No issues with constipation or diarrhea.   Review of Systems:  Review of Systems  Constitutional:  Negative for chills, fever and weight loss.  HENT:  Negative for tinnitus.   Respiratory:  Negative for cough, sputum production and shortness of breath.   Cardiovascular:  Negative for chest pain, palpitations and leg swelling.  Gastrointestinal:  Negative for abdominal pain, constipation, diarrhea and heartburn.  Genitourinary:  Negative for dysuria, frequency and urgency.  Musculoskeletal:  Negative for back pain, falls, joint pain and myalgias.  Skin: Negative.   Neurological:  Negative for dizziness and headaches.  Psychiatric/Behavioral:  Negative for depression and memory loss. The patient does not have insomnia.    Past Medical History:  Diagnosis Date    Arthritis    Blind left eye    Cyst in hand 10/2013   left hand, treated with antibiotic & pain medicine   Diabetes mellitus without complication (HCC)    A1c 7.1% 04/28/20   Heart murmur    Hepatitis A    25 YRS AGO   Hyperlipidemia    Pre-diabetes    Prostate cancer (HCC)    Sleep apnea    ????   O2 DESAT  10/26/13  IN ED , no sleep study ever done per pt- no cpap   Past Surgical History:  Procedure Laterality Date   COLONOSCOPY     EYE SURGERY     LEFT AYE AS CHILD    JOINT REPLACEMENT Bilateral    LT KNEE 3 YRS AGO    MOUTH SURGERY  09/24/2020   MULTIPLE EXTRACTIONS WITH ALVEOLOPLASTY N/A 08/21/2020   Procedure: MULTIPLE EXTRACTION WITH ALVEOLOPLASTY;  Surgeon: Sharman Cheek, DMD;  Location: [facility] OR;  Service: Dentistry;  Laterality: N/A;   TOTAL KNEE ARTHROPLASTY Right 12/04/2013   dr Jerl Santos   TOTAL KNEE ARTHROPLASTY Right 12/04/2013   Procedure: TOTAL KNEE ARTHROPLASTY;  Surgeon: Velna Ochs, MD;  Location: [facility] OR;  Service: Orthopedics;  Laterality: Right;   Social History:   reports that he quit smoking about 39 years ago. His smoking use included cigarettes. He has a 2.50 pack-year smoking history. He has never used smokeless tobacco. He reports that he does not drink alcohol and does not use drugs.  Family History  Problem Relation Age of Onset   Stroke Mother    Heart attack Mother    Diabetes Mother    Stroke Father    Stomach cancer Brother    Prostate cancer Brother    Throat cancer Sister    Colon cancer Neg Hx    Esophageal cancer Neg Hx    Rectal cancer Neg Hx     Medications: Patient's Medications  New Prescriptions   No medications on file  Previous Medications   ASPIRIN EC 325 MG TABLET    Take 1 tablet (325 mg total) by mouth daily.   TRIPTORELIN (TRELESTAR LA) 11.25 MG INJECTION    Inject 11.25 mg into the muscle every 6 (six) months.   XTANDI 40 MG TABLET    Take 4 tablets (160mg ) by mouth once daily as directed by physician.   Modified Medications   Modified Medication Previous Medication   ATORVASTATIN (LIPITOR) 20 MG TABLET atorvastatin (LIPITOR) 20 MG tablet      Take 1 tablet (20 mg total) by mouth every morning.    Take 1 tablet (20 mg total) by mouth every morning.  Discontinued Medications   No medications on file    Physical Exam:  Vitals:   08/18/22 1350 08/18/22 1406  BP: (!) 139/92 (!) 136/90  Pulse: 73   Resp: 18   Temp: (!) 97.5 F (36.4 C)   SpO2: 97%   Weight: 245 lb (111.1 kg)   Height: 5\' 9"  (1.753 m)    Body mass index is 36.18 kg/m. Wt Readings from Last 3 Encounters:  08/18/22 245 lb (111.1 kg)  02/17/22 247 lb (112 kg)  01/29/22 241 lb 13.5 oz (109.7 kg)    Physical Exam Constitutional:      General: He is not in acute distress.    Appearance: He is well-developed. He is not diaphoretic.  HENT:     Head: Normocephalic and atraumatic.     Right Ear: External ear normal.     Left Ear: External ear normal.     Mouth/Throat:     Pharynx: No oropharyngeal exudate.  Eyes:     Conjunctiva/sclera: Conjunctivae normal.     Pupils: Pupils are equal, round, and reactive to light.  Cardiovascular:     Rate and Rhythm: Normal rate and regular rhythm.     Heart sounds: Normal heart sounds.  Pulmonary:     Effort: Pulmonary effort is normal.     Breath sounds: Normal breath sounds.  Abdominal:     General: Bowel sounds are normal.     Palpations: Abdomen is soft.  Musculoskeletal:        General: No tenderness.     Cervical back: Normal range of motion and neck supple.     Right lower leg: No edema.     Left lower leg: No edema.  Skin:    General: Skin is warm and dry.  Neurological:     Mental Status: He is alert and oriented to person, place, and time.     Labs reviewed: Basic Metabolic Panel: Recent Labs    12/30/21 1210 01/27/22 2320 01/28/22 0534  NA 137 136 137  K 4.1 4.3 4.2  CL 107 105 108  CO2 21* 16* 18*  GLUCOSE 110* 201* 151*  BUN 17 16 10    CREATININE 0.85 0.92 0.78  CALCIUM 9.7 9.1 8.7*   Liver Function Tests: Recent Labs    08/19/21 0934 11/27/21 0901 12/30/21 1210 01/28/22 0937  AST  14* 16 14* 23  ALT 10 11 11 17   ALKPHOS 101  --  85 75  BILITOT 0.4 0.4 0.5 0.4  PROT 7.2 6.8 7.5 7.4  ALBUMIN 4.0  --  4.3 4.0   No results for input(s): "LIPASE", "AMYLASE" in the last 8760 hours. No results for input(s): "AMMONIA" in the last 8760 hours. CBC: Recent Labs    12/30/21 1210 01/27/22 2320 01/28/22 0534 01/29/22 0350  WBC 5.5 8.6 7.0 5.3  NEUTROABS 2.3 4.0 4.6  --   HGB 14.0 14.4 14.4 13.0  HCT 40.2 41.0 42.0 39.2  MCV 91.6 90.1 93.1 94.2  PLT 244 289 259 246   Lipid Panel: Recent Labs    11/27/21 0901  CHOL 178  HDL 82  LDLCALC 82  TRIG 63  CHOLHDL 2.2   TSH: No results for input(s): "TSH" in the last 8760 hours. A1C: Lab Results  Component Value Date   HGBA1C 6.7 (H) 11/27/2021     Assessment/Plan 1. Essential hypertension -blood pressure slightly elevated today, he is not interested in starting medication at this time.  follow metabolic panel - COMPLETE METABOLIC PANEL WITH GFR - CBC with Differential/Platelet  2. Type 2 diabetes mellitus with diabetic mononeuropathy, without long-term current use of insulin (HCC) -Encouraged dietary compliance, routine foot care/monitoring and to keep up with diabetic eye exams through ophthalmology  - Lipid panel - Hemoglobin A1c - Urine microalbumin-creatinine with uACR  3. Prostate cancer [facility]) -continues with urology follow up, PSA stable  4. Atherosclerosis of aorta (HCC) -continues on ASA - Lipid panel  5. Morbid obesity (HCC) --education provided on healthy weight loss through increase in physical activity and proper nutrition   6. Mixed hyperlipidemia -continues with lipitor and dietary modifications.  - atorvastatin (LIPITOR) 20 MG tablet; Take 1 tablet (20 mg total) by mouth every morning.  Dispense: 90 tablet; Refill:  1   Return in about 6 months (around 02/18/2023) for routine follow up .  Janene Harvey. [facility] [phone]

## 2022-08-18 NOTE — Patient Instructions (Signed)
You are due for your diabetic eye exam, please call. Ophthalmologist to call and schedule

## 2022-08-19 LAB — COMPLETE METABOLIC PANEL WITH GFR
AG Ratio: 1.5 (calc) (ref 1.0–2.5)
ALT: 10 U/L (ref 9–46)
AST: 13 U/L (ref 10–35)
Albumin: 4 g/dL (ref 3.6–5.1)
Alkaline phosphatase (APISO): 84 U/L (ref 35–144)
BUN: 16 mg/dL (ref 7–25)
CO2: 20 mmol/L (ref 20–32)
Calcium: 9 mg/dL (ref 8.6–10.3)
Chloride: 107 mmol/L (ref 98–110)
Creat: 0.88 mg/dL (ref 0.70–1.28)
Globulin: 2.6 g/dL (calc) (ref 1.9–3.7)
Glucose, Bld: 130 mg/dL (ref 65–139)
Potassium: 4.2 mmol/L (ref 3.5–5.3)
Sodium: 138 mmol/L (ref 135–146)
Total Bilirubin: 0.4 mg/dL (ref 0.2–1.2)
Total Protein: 6.6 g/dL (ref 6.1–8.1)
eGFR: 88 mL/min/{1.73_m2} (ref >=60)

## 2022-08-19 LAB — CBC WITH DIFFERENTIAL/PLATELET
Absolute Monocytes: 592 cells/uL (ref 200–950)
Basophils Absolute: 50 cells/uL (ref 0–200)
Basophils Relative: 0.8 %
Eosinophils Absolute: 50 cells/uL (ref 15–500)
Eosinophils Relative: 0.8 %
HCT: 39.5 % (ref 38.5–50.0)
Hemoglobin: 13.4 g/dL (ref 13.2–17.1)
Lymphs Abs: 2892 cells/uL (ref 850–3900)
MCH: 31.4 pg (ref 27.0–33.0)
MCHC: 33.9 g/dL (ref 32.0–36.0)
MCV: 92.5 fL (ref 80.0–100.0)
MPV: 10.4 fL (ref 7.5–12.5)
Monocytes Relative: 9.4 %
Neutro Abs: 2715 cells/uL (ref 1500–7800)
Neutrophils Relative %: 43.1 %
Platelets: 237 10*3/uL (ref 140–400)
RBC: 4.27 10*6/uL (ref 4.20–5.80)
RDW: 13.2 % (ref 11.0–15.0)
Total Lymphocyte: 45.9 %
WBC: 6.3 10*3/uL (ref 3.8–10.8)

## 2022-08-19 LAB — MICROALBUMIN / CREATININE URINE RATIO
Creatinine, Urine: 113 mg/dL (ref 20–320)
Microalb Creat Ratio: 4 mg/g creat (ref <30)
Microalb, Ur: 0.5 mg/dL

## 2022-08-19 LAB — LIPID PANEL
Cholesterol: 165 mg/dL (ref <200)
HDL: 73 mg/dL (ref >=40)
LDL Cholesterol (Calc): 73 mg/dL (calc)
Non-HDL Cholesterol (Calc): 92 mg/dL (calc) (ref <130)
Total CHOL/HDL Ratio: 2.3 (calc) (ref <5.0)
Triglycerides: 111 mg/dL (ref <150)

## 2022-08-19 LAB — HEMOGLOBIN A1C
Hgb A1c MFr Bld: 6.8 % of total Hgb — ABNORMAL HIGH (ref <5.7)
Mean Plasma Glucose: 148 mg/dL
eAG (mmol/L): 8.2 mmol/L

## 2022-08-20 ENCOUNTER — Ambulatory Visit: Payer: Medicare HMO | Admitting: Nurse Practitioner

## 2022-11-15 ENCOUNTER — Encounter: Payer: Medicare HMO | Admitting: Nurse Practitioner

## 2022-11-15 ENCOUNTER — Encounter: Payer: Self-pay | Admitting: Nurse Practitioner

## 2022-11-15 NOTE — Progress Notes (Deleted)
Subjective:   Samuel [name] is a 77 y.o. male who presents for Medicare Annual/Subsequent preventive examination.  Visit Complete: In person         Objective:    There were no vitals filed for this visit. There is no height or weight on file to calculate BMI.     02/17/2022    2:33 PM 01/28/2022   12:33 PM 01/27/2022   10:42 PM 11/27/2021    8:07 AM 11/10/2021    8:08 AM 06/26/2021   11:29 AM 05/29/2021   11:01 AM  Advanced Directives  Does Patient Have a Medical Advance Directive? No No No No No No No  Does patient want to make changes to medical advance directive?    Yes (MAU/Ambulatory/Procedural Areas - Information given) Yes (MAU/Ambulatory/Procedural Areas - Information given)    Would patient like information on creating a medical advance directive? Yes (MAU/Ambulatory/Procedural Areas - Information given) No - Patient declined    No - Patient declined No - Patient declined    Current Medications (verified) Outpatient Encounter Medications as of 11/15/2022  Medication Sig   aspirin EC 325 MG tablet Take 1 tablet (325 mg total) by mouth daily.   atorvastatin (LIPITOR) 20 MG tablet Take 1 tablet (20 mg total) by mouth every morning.   triptorelin (TRELESTAR LA) 11.25 MG injection Inject 11.25 mg into the muscle every 6 (six) months.   XTANDI 40 MG tablet Take 4 tablets (160mg ) by mouth once daily as directed by physician.   No facility-administered encounter medications on file as of 11/15/2022.    Allergies (verified) Other and Peach [prunus persica]   History: Past Medical History:  Diagnosis Date   Arthritis    Blind left eye    Cyst in hand 10/2013   left hand, treated with antibiotic & pain medicine   Diabetes mellitus without complication (HCC)    A1c 7.1% 04/28/20   Heart murmur    Hepatitis A    25 YRS AGO   Hyperlipidemia    Pre-diabetes    Prostate cancer (HCC)    Sleep apnea    ????   O2 DESAT  10/26/13  IN ED , no sleep study ever done per pt- no  cpap   Past Surgical History:  Procedure Laterality Date   COLONOSCOPY     EYE SURGERY     LEFT AYE AS CHILD    JOINT REPLACEMENT Bilateral    LT KNEE 3 YRS AGO    MOUTH SURGERY  09/24/2020   MULTIPLE EXTRACTIONS WITH ALVEOLOPLASTY N/A 08/21/2020   Procedure: MULTIPLE EXTRACTION WITH ALVEOLOPLASTY;  Surgeon: Sharman Cheek, DMD;  Location: [facility] OR;  Service: Dentistry;  Laterality: N/A;   TOTAL KNEE ARTHROPLASTY Right 12/04/2013   dr Jerl Santos   TOTAL KNEE ARTHROPLASTY Right 12/04/2013   Procedure: TOTAL KNEE ARTHROPLASTY;  Surgeon: Velna Ochs, MD;  Location: [facility] OR;  Service: Orthopedics;  Laterality: Right;   Family History  Problem Relation Age of Onset   Stroke Mother    Heart attack Mother    Diabetes Mother    Stroke Father    Stomach cancer Brother    Prostate cancer Brother    Throat cancer Sister    Colon cancer Neg Hx    Esophageal cancer Neg Hx    Rectal cancer Neg Hx    Social History   Socioeconomic History   Marital status: Widowed    Spouse name: Not on file   Number of children: Not on  file   Years of education: Not on file   Highest education level: Not on file  Occupational History   Not on file  Tobacco Use   Smoking status: Former    Current packs/day: 0.00    Average packs/day: 0.5 packs/day for 5.0 years (2.5 ttl pk-yrs)    Types: Cigarettes    Start date: 02/15/1978    Quit date: 02/16/1983    Years since quitting: 39.7   Smokeless tobacco: Never  Vaping Use   Vaping status: Never Used  Substance and Sexual Activity   Alcohol use: No    Alcohol/week: 0.0 standard drinks of alcohol   Drug use: No   Sexual activity: Not on file  Other Topics Concern   Not on file  Social History Narrative   DIET: Fruit, veggies, beef, pork, chicken, fish      DO YOU DRINK/EAT THINGS WITH CAFFEINE: yes      MARITAL STATUS: widowed      WHAT YEAR WERE YOU MARRIED:1965      DO YOU LIVE IN A HOUSE, APARTMENT, ASSISTED LIVING, CONDO TRAILER ETC.:  house      IS IT ONE OR MORE STORIES: 1      HOW MANY PERSONS LIVE IN YOUR HOME: 2      DO YOU HAVE PETS IN YOUR HOME: no      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      DO YOU EXERCISE: yes      WHAT TYPE AND HOW OFTEN: walking 5 days a week   Social Determinants of Health   Financial Resource Strain: Low Risk  (10/14/2017)   Overall Financial Resource Strain (CARDIA)    Difficulty of Paying Living Expenses: Not hard at all  Food Insecurity: No Food Insecurity (02/02/2022)   Hunger Vital Sign    Worried About Running Out of Food in the Last Year: Never true    Ran Out of Food in the Last Year: Never true  Transportation Needs: No Transportation Needs (02/02/2022)   PRAPARE - Administrator, Civil Service (Medical): No    Lack of Transportation (Non-Medical): No  Physical Activity: Insufficiently Active (10/14/2017)   Exercise Vital Sign    Days of Exercise per Week: 3 days    Minutes of Exercise per Session: 10 min  Stress: No Stress Concern Present (10/14/2017)   [employer] of Occupational Health - Occupational Stress Questionnaire    Feeling of Stress : Not at all  Social Connections: Somewhat Isolated (10/14/2017)   Social Connection and Isolation Panel [NHANES]    Frequency of Communication with Friends and Family: More than three times a week    Frequency of Social Gatherings with Friends and Family: More than three times a week    Attends Religious Services: More than 4 times per year    Active Member of [employer] or Organizations: No    Attends Banker Meetings: Never    Marital Status: Widowed    Tobacco Counseling Counseling given: Not Answered   Clinical Intake:                        Activities of Daily Living    01/28/2022    5:07 PM 01/28/2022    5:05 PM  In your present state of health, do you have any difficulty performing the following activities:  Hearing? 0   Vision? 1   Comment Blind L. eye    Difficulty  concentrating or making decisions? 0   Walking or climbing stairs? 1   Dressing or bathing? 0   Doing errands, shopping? 1 1    Patient Care Team: Sharon Seller, NP as PCP - General (Geriatric Medicine) Jerilee Field, MD as Consulting Physician (Urology)  Indicate any recent Medical Services you may have received from other than [facility] providers in the past year (date may be approximate).     Assessment:   This is a routine wellness examination for Terell.  Hearing/Vision screen No results found.   Goals Addressed   None    Depression Screen    02/17/2022    2:32 PM 11/10/2021    8:10 AM 05/29/2021   12:50 PM 10/31/2020    8:25 AM 10/30/2019    9:16 AM 01/10/2019    9:13 AM 10/16/2018   11:36 AM  PHQ 2/9 Scores  PHQ - 2 Score 0 0 0 0 0 0 0    Fall Risk    08/18/2022    1:49 PM 02/17/2022    2:31 PM 11/27/2021    8:07 AM 11/10/2021    8:10 AM 05/29/2021   12:49 PM  Fall Risk   Falls in the past year? 0 1 0 0 1  Number falls in past yr: 0 1 0 0 0  Injury with Fall? 0 0 0 0 0  Risk for fall due to : No Fall Risks History of fall(s) No Fall Risks No Fall Risks No Fall Risks  Follow up  Falls evaluation completed Falls evaluation completed Falls evaluation completed Falls evaluation completed    MEDICARE RISK AT HOME:    TIMED UP AND GO:  Was the test performed?  No    Cognitive Function:    10/14/2017    9:30 AM 10/08/2016   11:27 AM 05/21/2015    2:59 PM  MMSE - Mini Mental State Exam  Not completed:   --  Orientation to time 5 4 5   Orientation to Place 5 5 5   Registration 3 3 3   Attention/ Calculation 5 5 5   Recall 3 1 1   Language- name 2 objects 2 2 2   Language- repeat 1 1 1   Language- follow 3 step command 3 3 3   Language- read & follow direction 1 1 1   Write a sentence 1 1 1   Copy design 1 1 1   Total score 30 27 28         11/10/2021    8:20 AM 10/31/2020    8:27 AM 10/30/2019    9:20 AM 10/16/2018   11:37 AM  6CIT Screen  What  Year? 0 points 0 points 0 points 0 points  What month? 0 points 0 points 0 points 0 points  What time? 0 points 0 points 0 points 0 points  Count back from 20 2 points 0 points 0 points 0 points  Months in reverse 0 points 2 points 0 points 2 points  Repeat phrase 2 points 2 points 0 points 6 points  Total Score 4 points 4 points 0 points 8 points    Immunizations Immunization History  Administered Date(s) Administered   Moderna Sars-Covid-2 Vaccination 04/16/2019, 05/14/2019, 01/31/2020    TDAP status: Due, Education has been provided regarding the importance of this vaccine. Advised may receive this vaccine at local pharmacy or Health Dept. Aware to provide a copy of the vaccination record if obtained from local pharmacy or Health Dept. Verbalized acceptance and understanding.  Flu Vaccine status: Due, Education has  been provided regarding the importance of this vaccine. Advised may receive this vaccine at local pharmacy or Health Dept. Aware to provide a copy of the vaccination record if obtained from local pharmacy or Health Dept. Verbalized acceptance and understanding.  Pneumococcal vaccine status: Declined,  Education has been provided regarding the importance of this vaccine but patient still declined. Advised may receive this vaccine at local pharmacy or Health Dept. Aware to provide a copy of the vaccination record if obtained from local pharmacy or Health Dept. Verbalized acceptance and understanding.   Covid-19 vaccine status: Information provided on how to obtain vaccines.   Qualifies for Shingles Vaccine? Yes   Zostavax completed No   Shingrix Completed?: No.    Education has been provided regarding the importance of this vaccine. Patient has been advised to call insurance company to determine out of pocket expense if they have not yet received this vaccine. Advised may also receive vaccine at local pharmacy or Health Dept. Verbalized acceptance and understanding.  Screening  Tests Health Maintenance  Topic Date Due   DTaP/Tdap/Td (1 - Tdap) Never done   OPHTHALMOLOGY EXAM  05/11/2018   COVID-19 Vaccine (4 - 2023-24 season) 10/17/2022   Medicare Annual Wellness (AWV)  11/11/2022   INFLUENZA VACCINE  02/16/2048 (Originally 09/16/2022)   FOOT EXAM  11/28/2022   HEMOGLOBIN A1C  02/18/2023   Diabetic kidney evaluation - eGFR measurement  08/18/2023   Diabetic kidney evaluation - Urine ACR  08/18/2023   Hepatitis C Screening  Completed   HPV VACCINES  Aged Out   Pneumonia Vaccine 8+ Years old  Discontinued   Fecal DNA (Cologuard)  Discontinued   Zoster Vaccines- Shingrix  Discontinued    Health Maintenance  Health Maintenance Due  Topic Date Due   DTaP/Tdap/Td (1 - Tdap) Never done   OPHTHALMOLOGY EXAM  05/11/2018   COVID-19 Vaccine (4 - 2023-24 season) 10/17/2022   Medicare Annual Wellness (AWV)  11/11/2022    Colorectal cancer screening: No longer required.   Lung Cancer Screening: (Low Dose CT Chest recommended if Age 57-80 years, 20 pack-year currently smoking OR have quit w/in 15years.) does not qualify.   Lung Cancer Screening Referral: na  Additional Screening:  Hepatitis C Screening: does qualify; Completed   Vision Screening: Recommended annual ophthalmology exams for early detection of glaucoma and other disorders of the eye. Is the patient up to date with their annual eye exam?  {YES/NO:21197} Who is the provider or what is the name of the office in which the patient attends annual eye exams? *** If pt is not established with a provider, would they like to be referred to a provider to establish care? {YES/NO:21197}.   Dental Screening: Recommended annual dental exams for proper oral hygiene  Diabetic Foot Exam: Diabetic Foot Exam: Completed 11/27/2021  Community Resource Referral / Chronic Care Management: CRR required this visit?  No   CCM required this visit?  No     Plan:     I have personally reviewed and noted the  following in the patient's chart:   Medical and social history Use of alcohol, tobacco or illicit drugs  Current medications and supplements including opioid prescriptions. Patient is not currently taking opioid prescriptions. Functional ability and status Nutritional status Physical activity Advanced directives List of other physicians Hospitalizations, surgeries, and ER visits in previous 12 months Vitals Screenings to include cognitive, depression, and falls Referrals and appointments  In addition, I have reviewed and discussed with patient certain preventive protocols, quality  metrics, and best practice recommendations. A written personalized care plan for preventive services as well as general preventive health recommendations were provided to patient.     Sharon Seller, NP   11/15/2022

## 2022-11-15 NOTE — Progress Notes (Signed)
Err

## 2023-01-07 ENCOUNTER — Other Ambulatory Visit: Payer: Self-pay

## 2023-01-12 DIAGNOSIS — Z192 Hormone resistant malignancy status: Secondary | ICD-10-CM | POA: Diagnosis not present

## 2023-01-12 DIAGNOSIS — C61 Malignant neoplasm of prostate: Secondary | ICD-10-CM | POA: Diagnosis not present

## 2023-01-19 DIAGNOSIS — M8580 Other specified disorders of bone density and structure, unspecified site: Secondary | ICD-10-CM | POA: Diagnosis not present

## 2023-01-19 DIAGNOSIS — C61 Malignant neoplasm of prostate: Secondary | ICD-10-CM | POA: Diagnosis not present

## 2023-01-26 ENCOUNTER — Other Ambulatory Visit (HOSPITAL_COMMUNITY): Payer: Self-pay | Admitting: Urology

## 2023-01-26 DIAGNOSIS — Z192 Hormone resistant malignancy status: Secondary | ICD-10-CM

## 2023-02-07 ENCOUNTER — Encounter (HOSPITAL_COMMUNITY)
Admission: RE | Admit: 2023-02-07 | Discharge: 2023-02-07 | Disposition: A | Discharge status: Home / Self Care | Payer: Medicare HMO | Source: Ambulatory Visit | Attending: Urology | Admitting: Urology

## 2023-02-07 DIAGNOSIS — Z192 Hormone resistant malignancy status: Secondary | ICD-10-CM | POA: Diagnosis not present

## 2023-02-07 DIAGNOSIS — R918 Other nonspecific abnormal finding of lung field: Secondary | ICD-10-CM | POA: Diagnosis not present

## 2023-02-07 DIAGNOSIS — C61 Malignant neoplasm of prostate: Secondary | ICD-10-CM | POA: Diagnosis not present

## 2023-02-07 MED ORDER — FLOTUFOLASTAT F 18 GALLIUM 296-5846 MBQ/ML IV SOLN
8.1600 | Freq: Once | INTRAVENOUS | Status: AC
  Administered 2023-02-07: 8.16 via INTRAVENOUS

## 2023-02-18 ENCOUNTER — Encounter: Payer: Self-pay | Admitting: Nurse Practitioner

## 2023-02-18 ENCOUNTER — Ambulatory Visit (INDEPENDENT_AMBULATORY_CARE_PROVIDER_SITE_OTHER): Payer: Medicare HMO | Admitting: Nurse Practitioner

## 2023-02-18 VITALS — BP 130/80 | HR 72 | Temp 97.1°F | Ht 69.0 in | Wt 241.0 lb

## 2023-02-18 DIAGNOSIS — C61 Malignant neoplasm of prostate: Secondary | ICD-10-CM

## 2023-02-18 DIAGNOSIS — I700 Atherosclerosis of aorta: Secondary | ICD-10-CM

## 2023-02-18 DIAGNOSIS — E1141 Type 2 diabetes mellitus with diabetic mononeuropathy: Secondary | ICD-10-CM | POA: Diagnosis not present

## 2023-02-18 DIAGNOSIS — I10 Essential (primary) hypertension: Secondary | ICD-10-CM | POA: Diagnosis not present

## 2023-02-18 DIAGNOSIS — E782 Mixed hyperlipidemia: Secondary | ICD-10-CM

## 2023-02-18 MED ORDER — ATORVASTATIN CALCIUM 20 MG PO TABS: 20.0000 mg | ORAL_TABLET | Freq: Every morning | ORAL | 1 refills | Status: DC

## 2023-02-18 NOTE — Patient Instructions (Signed)
 Make appt for follow up with Dr Posey [facility] Address: [address] [place], [place], [place] [address] [phone]

## 2023-02-18 NOTE — Progress Notes (Signed)
 Careteam: Patient Care Team: Caro Harlene POUR, NP as PCP - General (Geriatric Medicine) Nieves Cough, MD as Consulting Physician (Urology)  PLACE OF SERVICE:  [facility]  Advanced Directive information Does Patient Have a Medical Advance Directive?: No, Would patient like information on creating a medical advance directive?: Yes (MAU/Ambulatory/Procedural Areas - Information given) (Have paper from previous visit)  Allergies  Allergen Reactions   Other Other (See Comments)    Patient stated,if I touch felt (material) I get knots on my body.   Peach [Prunus Persica] Other (See Comments)    Patient stated,if I touch or eat peach fuzz, I get knots all over my face and mouth.     Chief Complaint  Patient presents with   Medical Management of Chronic Issues    6 month follow-up. Discuss need for eye exam, seen Dr.Groat in the past (not recently), td/tdap, A1c, covid booster and AWV.       HPI: Patient is a 78 y.o. male for routine follow up Reports he is doing well.  No pain.   He is blind in his left eye and reports he does not want anyone messing up his right eye.   DM- off all diabetic medication.   Has lost weight but has decreased is oral intake. Reports when he eats too much it effects his sleep. He lives with significant other-   Continues on Lipitor  Review of Systems:  Review of Systems  Constitutional:  Negative for chills, fever and weight loss.  HENT:  Negative for tinnitus.   Respiratory:  Negative for cough, sputum production and shortness of breath.   Cardiovascular:  Negative for chest pain, palpitations and leg swelling.  Gastrointestinal:  Negative for abdominal pain, constipation, diarrhea and heartburn.  Genitourinary:  Negative for dysuria, frequency and urgency.       Slow stream  Musculoskeletal:  Negative for back pain, falls, joint pain and myalgias.  Skin: Negative.   Neurological:  Negative for dizziness and headaches.   Psychiatric/Behavioral:  Negative for depression and memory loss. The patient does not have insomnia.     Past Medical History:  Diagnosis Date   Arthritis    Blind left eye    Cyst in hand 10/2013   left hand, treated with antibiotic & pain medicine   Diabetes mellitus without complication (HCC)    A1c 7.1% 04/28/20   Heart murmur    Hepatitis A    25 YRS AGO   Hyperlipidemia    Pre-diabetes    Prostate cancer (HCC)    Sleep apnea    ????   O2 DESAT  10/26/13  IN ED , no sleep study ever done per pt- no cpap   Past Surgical History:  Procedure Laterality Date   COLONOSCOPY     EYE SURGERY     LEFT AYE AS CHILD    JOINT REPLACEMENT Bilateral    LT KNEE 3 YRS AGO    MOUTH SURGERY  09/24/2020   MULTIPLE EXTRACTIONS WITH ALVEOLOPLASTY N/A 08/21/2020   Procedure: MULTIPLE EXTRACTION WITH ALVEOLOPLASTY;  Surgeon: Celena Lum NOVAK, DMD;  Location: [facility] OR;  Service: Dentistry;  Laterality: N/A;   TOTAL KNEE ARTHROPLASTY Right 12/04/2013   dr sheril   TOTAL KNEE ARTHROPLASTY Right 12/04/2013   Procedure: TOTAL KNEE ARTHROPLASTY;  Surgeon: Maude KANDICE Sheril, MD;  Location: [facility] OR;  Service: Orthopedics;  Laterality: Right;   Social History:   reports that he quit smoking about 40 years ago. His smoking use  included cigarettes. He started smoking about 45 years ago. He has a 2.5 pack-year smoking history. He has never used smokeless tobacco. He reports that he does not drink alcohol and does not use drugs.  Family History  Problem Relation Age of Onset   Stroke Mother    Heart attack Mother    Diabetes Mother    Stroke Father    Stomach cancer Brother    Prostate cancer Brother    Throat cancer Sister    Colon cancer Neg Hx    Esophageal cancer Neg Hx    Rectal cancer Neg Hx     Medications: Patient's Medications  New Prescriptions   No medications on file  Previous Medications   ASPIRIN  EC 325 MG TABLET    Take 1 tablet (325 mg total) by mouth daily.   ATORVASTATIN   (LIPITOR) 20 MG TABLET    Take 1 tablet (20 mg total) by mouth every morning.   TRIPTORELIN (TRELESTAR LA) 11.25 MG INJECTION    Inject 11.25 mg into the muscle every 6 (six) months.   XTANDI  40 MG TABLET    Take 4 tablets (160mg ) by mouth once daily as directed by physician.  Modified Medications   No medications on file  Discontinued Medications   No medications on file    Physical Exam:  Vitals:   02/18/23 1427 02/18/23 1429  BP: (!) 140/92 130/80  Pulse: 72   Temp: (!) 97.1 F (36.2 C)   TempSrc: Temporal   SpO2: 98%   Weight: 241 lb (109.3 kg)   Height: 5' 9 (1.753 m)    Body mass index is 35.59 kg/m. Wt Readings from Last 3 Encounters:  02/18/23 241 lb (109.3 kg)  08/18/22 245 lb (111.1 kg)  02/17/22 247 lb (112 kg)    Physical Exam Constitutional:      General: He is not in acute distress.    Appearance: He is well-developed. He is not diaphoretic.  HENT:     Head: Normocephalic and atraumatic.     Right Ear: External ear normal.     Left Ear: External ear normal.     Mouth/Throat:     Pharynx: No oropharyngeal exudate.  Eyes:     Conjunctiva/sclera: Conjunctivae normal.     Pupils: Pupils are equal, round, and reactive to light.  Cardiovascular:     Rate and Rhythm: Normal rate and regular rhythm.     Heart sounds: Normal heart sounds.  Pulmonary:     Effort: Pulmonary effort is normal.     Breath sounds: Normal breath sounds.  Abdominal:     General: Bowel sounds are normal.     Palpations: Abdomen is soft.  Musculoskeletal:        General: No tenderness.     Cervical back: Normal range of motion and neck supple.     Right lower leg: No edema.     Left lower leg: No edema.  Skin:    General: Skin is warm and dry.  Neurological:     Mental Status: He is alert and oriented to person, place, and time.     Labs reviewed: Basic Metabolic Panel: Recent Labs    08/18/22 1416  NA 138  K 4.2  CL 107  CO2 20  GLUCOSE 130  BUN 16  CREATININE  0.88  CALCIUM  9.0   Liver Function Tests: Recent Labs    08/18/22 1416  AST 13  ALT 10  BILITOT 0.4  PROT 6.6   No  results for input(s): LIPASE, AMYLASE in the last 8760 hours. No results for input(s): AMMONIA in the last 8760 hours. CBC: Recent Labs    08/18/22 1416  WBC 6.3  NEUTROABS 2,715  HGB 13.4  HCT 39.5  MCV 92.5  PLT 237   Lipid Panel: Recent Labs    08/18/22 1416  CHOL 165  HDL 73  LDLCALC 73  TRIG 111  CHOLHDL 2.3   TSH: No results for input(s): TSH in the last 8760 hours. A1C: Lab Results  Component Value Date   HGBA1C 6.8 (H) 08/18/2022     Assessment/Plan .1. Type 2 diabetes mellitus with diabetic mononeuropathy, without long-term current use of insulin (HCC) (Primary) -Encouraged dietary compliance, routine foot care/monitoring and to keep up with diabetic eye exams through ophthalmology  -no longer requiring medication  - Hemoglobin A1c  2. Essential hypertension -Blood pressure well controlled, goal bp <140/90 Continue current medications and dietary modifications follow metabolic panel - COMPLETE METABOLIC PANEL WITH GFR - CBC with Differential/Platelet  3. Prostate cancer (HCC) -stable, continues to follow up with urology  4. Atherosclerosis of aorta (HCC) Continues on statin and ASA  5. Morbid obesity (HCC) --education provided on healthy weight loss through increase in physical activity and proper nutrition   6. Mixed hyperlipidemia -continue dietary modifications  - atorvastatin  (LIPITOR) 20 MG tablet; Take 1 tablet (20 mg total) by mouth every morning.  Dispense: 90 tablet; Refill: 1   Return in about 6 months (around 08/18/2023) for routine follow up.  Million Maharaj K. Caro BODILY [facility] [phone]

## 2023-02-19 LAB — HEMOGLOBIN A1C
Hgb A1c MFr Bld: 6.7 %{Hb} — ABNORMAL HIGH (ref <5.7)
Mean Plasma Glucose: 146 mg/dL
eAG (mmol/L): 8.1 mmol/L

## 2023-02-19 LAB — CBC WITH DIFFERENTIAL/PLATELET
Absolute Lymphocytes: 2897 {cells}/uL (ref 850–3900)
Absolute Monocytes: 537 {cells}/uL (ref 200–950)
Basophils Absolute: 30 {cells}/uL (ref 0–200)
Basophils Relative: 0.5 %
Eosinophils Absolute: 0 {cells}/uL — ABNORMAL LOW (ref 15–500)
Eosinophils Relative: 0 %
HCT: 40.2 % (ref 38.5–50.0)
Hemoglobin: 13.8 g/dL (ref 13.2–17.1)
MCH: 31.4 pg (ref 27.0–33.0)
MCHC: 34.3 g/dL (ref 32.0–36.0)
MCV: 91.6 fL (ref 80.0–100.0)
MPV: 11.2 fL (ref 7.5–12.5)
Monocytes Relative: 9.1 %
Neutro Abs: 2437 {cells}/uL (ref 1500–7800)
Neutrophils Relative %: 41.3 %
Platelets: 261 10*3/uL (ref 140–400)
RBC: 4.39 10*6/uL (ref 4.20–5.80)
RDW: 12.9 % (ref 11.0–15.0)
Total Lymphocyte: 49.1 %
WBC: 5.9 10*3/uL (ref 3.8–10.8)

## 2023-02-19 LAB — COMPLETE METABOLIC PANEL WITH GFR
AG Ratio: 1.6 (calc) (ref 1.0–2.5)
ALT: 12 U/L (ref 9–46)
AST: 14 U/L (ref 10–35)
Albumin: 4.2 g/dL (ref 3.6–5.1)
Alkaline phosphatase (APISO): 75 U/L (ref 35–144)
BUN: 18 mg/dL (ref 7–25)
CO2: 20 mmol/L (ref 20–32)
Calcium: 9.4 mg/dL (ref 8.6–10.3)
Chloride: 108 mmol/L (ref 98–110)
Creat: 0.71 mg/dL (ref 0.70–1.28)
Globulin: 2.7 g/dL (ref 1.9–3.7)
Glucose, Bld: 103 mg/dL (ref 65–139)
Potassium: 4.2 mmol/L (ref 3.5–5.3)
Sodium: 139 mmol/L (ref 135–146)
Total Bilirubin: 0.5 mg/dL (ref 0.2–1.2)
Total Protein: 6.9 g/dL (ref 6.1–8.1)
eGFR: 94 mL/min/{1.73_m2} (ref >=60)

## 2023-02-21 ENCOUNTER — Encounter: Payer: Medicare HMO | Admitting: Nurse Practitioner

## 2023-02-28 ENCOUNTER — Ambulatory Visit: Payer: Medicare HMO | Admitting: Nurse Practitioner

## 2023-02-28 ENCOUNTER — Encounter: Payer: Self-pay | Admitting: Nurse Practitioner

## 2023-02-28 VITALS — BP 132/82 | HR 70 | Temp 97.9°F | Ht 68.0 in | Wt 241.0 lb

## 2023-02-28 DIAGNOSIS — Z0000 Encounter for general adult medical examination without abnormal findings: Secondary | ICD-10-CM | POA: Diagnosis not present

## 2023-02-28 NOTE — Patient Instructions (Signed)
  Samuel [name] , Thank you for taking time to come for your Medicare Wellness Visit. I appreciate your ongoing commitment to your health goals. Please review the following plan we discussed and let me know if I can assist you in the future.   Please make your routine/diabetic eye exam with dr Octavia    This is a list of the screening recommended for you and due dates:  Health Maintenance  Topic Date Due   Eye exam for diabetics  05/11/2018   DTaP/Tdap/Td vaccine (1 - Tdap) 02/28/2024*   COVID-19 Vaccine (4 - 2024-25 season) 02/28/2024*   Flu Shot  02/16/2048*   Yearly kidney health urinalysis for diabetes  08/18/2023   Hemoglobin A1C  08/18/2023   Yearly kidney function blood test for diabetes  02/18/2024   Complete foot exam   02/18/2024   Medicare Annual Wellness Visit  02/28/2024   Hepatitis C Screening  Completed   HPV Vaccine  Aged Out   Pneumonia Vaccine  Discontinued   Cologuard (Stool DNA test)  Discontinued   Zoster (Shingles) Vaccine  Discontinued  *Topic was postponed. The date shown is not the original due date.

## 2023-02-28 NOTE — Progress Notes (Signed)
 Subjective:   Samuel [name] is a 78 y.o. male who presents for Medicare Annual/Subsequent preventive examination.  Visit Complete: In person    Cardiac Risk Factors include: advanced age (>41men, >66 women);dyslipidemia;hypertension;sedentary lifestyle;male gender     Objective:    Today's Vitals   02/28/23 1049 02/28/23 1103  BP: 132/82   Pulse: 70   Temp: 97.9 F (36.6 C)   TempSrc: Temporal   SpO2: 98%   Weight: 241 lb (109.3 kg)   Height: 5' 8 (1.727 m)   PainSc: 0-No pain 0-No pain   Body mass index is 36.64 kg/m.     02/28/2023   10:50 AM 02/18/2023    2:27 PM 11/15/2022    3:07 PM 02/17/2022    2:33 PM 01/28/2022   12:33 PM 01/27/2022   10:42 PM 11/27/2021    8:07 AM  Advanced Directives  Does Patient Have a Medical Advance Directive? No No No No No No No  Does patient want to make changes to medical advance directive?       Yes (MAU/Ambulatory/Procedural Areas - Information given)  Would patient like information on creating a medical advance directive? Yes (MAU/Ambulatory/Procedural Areas - Information given) Yes (MAU/Ambulatory/Procedural Areas - Information given) No - Patient declined Yes (MAU/Ambulatory/Procedural Areas - Information given) No - Patient declined      Current Medications (verified) Outpatient Encounter Medications as of 02/28/2023  Medication Sig   aspirin  EC 325 MG tablet Take 1 tablet (325 mg total) by mouth daily.   atorvastatin  (LIPITOR) 20 MG tablet Take 1 tablet (20 mg total) by mouth every morning.   triptorelin (TRELESTAR LA) 11.25 MG injection Inject 11.25 mg into the muscle every 6 (six) months.   XTANDI  40 MG tablet Take 4 tablets (160mg ) by mouth once daily as directed by physician.   No facility-administered encounter medications on file as of 02/28/2023.    Allergies (verified) Other and Peach [prunus persica]   History: Past Medical History:  Diagnosis Date   Arthritis    Blind left eye    Cyst in hand 10/2013    left hand, treated with antibiotic & pain medicine   Diabetes mellitus without complication (HCC)    A1c 7.1% 04/28/20   Heart murmur    Hepatitis A    25 YRS AGO   Hyperlipidemia    Pre-diabetes    Prostate cancer (HCC)    Sleep apnea    ????   O2 DESAT  10/26/13  IN ED , no sleep study ever done per pt- no cpap   Past Surgical History:  Procedure Laterality Date   COLONOSCOPY     EYE SURGERY     LEFT AYE AS CHILD    JOINT REPLACEMENT Bilateral    LT KNEE 3 YRS AGO    MOUTH SURGERY  09/24/2020   MULTIPLE EXTRACTIONS WITH ALVEOLOPLASTY N/A 08/21/2020   Procedure: MULTIPLE EXTRACTION WITH ALVEOLOPLASTY;  Surgeon: Celena Lum NOVAK, DMD;  Location: [facility] OR;  Service: Dentistry;  Laterality: N/A;   TOTAL KNEE ARTHROPLASTY Right 12/04/2013   dr sheril   TOTAL KNEE ARTHROPLASTY Right 12/04/2013   Procedure: TOTAL KNEE ARTHROPLASTY;  Surgeon: Maude KANDICE Sheril, MD;  Location: [facility] OR;  Service: Orthopedics;  Laterality: Right;   Family History  Problem Relation Age of Onset   Stroke Mother    Heart attack Mother    Diabetes Mother    Stroke Father    Throat cancer Sister    Stomach cancer Brother  Prostate cancer Brother    Heart attack Brother    Seizures Brother    Colon cancer Neg Hx    Esophageal cancer Neg Hx    Rectal cancer Neg Hx    Social History   Socioeconomic History   Marital status: Widowed    Spouse name: Not on file   Number of children: Not on file   Years of education: Not on file   Highest education level: Not on file  Occupational History   Not on file  Tobacco Use   Smoking status: Former    Current packs/day: 0.00    Average packs/day: 0.5 packs/day for 5.0 years (2.5 ttl pk-yrs)    Types: Cigarettes    Start date: 02/15/1978    Quit date: 02/16/1983    Years since quitting: 40.0   Smokeless tobacco: Never  Vaping Use   Vaping status: Never Used  Substance and Sexual Activity   Alcohol use: No    Alcohol/week: 0.0 standard drinks of alcohol    Drug use: No   Sexual activity: Not on file  Other Topics Concern   Not on file  Social History Narrative   DIET: Fruit, veggies, beef, pork, chicken, fish      DO YOU DRINK/EAT THINGS WITH CAFFEINE: yes      MARITAL STATUS: widowed      WHAT YEAR WERE YOU MARRIED:1965      DO YOU LIVE IN A HOUSE, APARTMENT, ASSISTED LIVING, CONDO TRAILER ETC.: house      IS IT ONE OR MORE STORIES: 1      HOW MANY PERSONS LIVE IN YOUR HOME: 2      DO YOU HAVE PETS IN YOUR HOME: no      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      DO YOU EXERCISE: yes      WHAT TYPE AND HOW OFTEN: walking 5 days a week   Social Drivers of Corporate Investment Banker Strain: Low Risk  (10/14/2017)   Overall Financial Resource Strain (CARDIA)    Difficulty of Paying Living Expenses: Not hard at all  Food Insecurity: No Food Insecurity (02/02/2022)   Hunger Vital Sign    Worried About Running Out of Food in the Last Year: Never true    Ran Out of Food in the Last Year: Never true  Transportation Needs: No Transportation Needs (02/02/2022)   PRAPARE - Administrator, Civil Service (Medical): No    Lack of Transportation (Non-Medical): No  Physical Activity: Insufficiently Active (10/14/2017)   Exercise Vital Sign    Days of Exercise per Week: 3 days    Minutes of Exercise per Session: 10 min  Stress: No Stress Concern Present (10/14/2017)   [employer] of Occupational Health - Occupational Stress Questionnaire    Feeling of Stress : Not at all  Social Connections: Somewhat Isolated (10/14/2017)   Social Connection and Isolation Panel [NHANES]    Frequency of Communication with Friends and Family: More than three times a week    Frequency of Social Gatherings with Friends and Family: More than three times a week    Attends Religious Services: More than 4 times per year    Active Member of [employer] or Organizations: No    Attends Banker Meetings: Never    Marital  Status: Widowed    Tobacco Counseling Counseling given: Not Answered   Clinical Intake:  Pre-visit preparation completed: Yes  Pain : No/denies pain  Pain Score: 0-No pain     BMI - recorded: 36 Nutritional Risks: None Diabetes: No  How often do you need to have someone help you when you read instructions, pamphlets, or other written materials from your doctor or pharmacy?: 1 - Never         Activities of Daily Living    02/28/2023   11:01 AM  In your present state of health, do you have any difficulty performing the following activities:  Hearing? 0  Vision? 0  Difficulty concentrating or making decisions? 0  Walking or climbing stairs? 1  Comment slow  Dressing or bathing? 0  Doing errands, shopping? 0  Preparing Food and eating ? N  Using the Toilet? N  In the past six months, have you accidently leaked urine? N  Do you have problems with loss of bowel control? N  Managing your Medications? N  Managing your Finances? N  Housekeeping or managing your Housekeeping? N    Patient Care Team: Caro Harlene POUR, NP as PCP - General (Geriatric Medicine) Nieves Cough, MD as Consulting Physician (Urology)  Indicate any recent Medical Services you may have received from other than [facility] providers in the past year (date may be approximate).     Assessment:   This is a routine wellness examination for Jayvien.  Hearing/Vision screen Hearing Screening - Comments:: No hearing issues  Vision Screening - Comments:: Eye exam due, patient is aware    Goals Addressed   None    Depression Screen    02/18/2023    2:26 PM 02/17/2022    2:32 PM 11/10/2021    8:10 AM 05/29/2021   12:50 PM 10/31/2020    8:25 AM 10/30/2019    9:16 AM 01/10/2019    9:13 AM  PHQ 2/9 Scores  PHQ - 2 Score 0 0 0 0 0 0 0    Fall Risk    02/28/2023   10:48 AM 02/18/2023    2:26 PM 08/18/2022    1:49 PM 02/17/2022    2:31 PM 11/27/2021    8:07 AM  Fall Risk   Falls in the past year? 1 0 0  1 0  Comment December 2024      Number falls in past yr: 0 0 0 1 0  Injury with Fall? 1 0 0 0 0  Comment Temporarly Paralyzed      Risk for fall due to : History of fall(s) No Fall Risks No Fall Risks History of fall(s) No Fall Risks  Follow up Falls evaluation completed Falls evaluation completed  Falls evaluation completed Falls evaluation completed    MEDICARE RISK AT HOME: Medicare Risk at Home Any stairs in or around the home?: No Home free of loose throw rugs in walkways, pet beds, electrical cords, etc?: Yes Adequate lighting in your home to reduce risk of falls?: Yes Life alert?: No Use of a cane, walker or w/c?: No Grab bars in the bathroom?: Yes Shower chair or bench in shower?: Yes Elevated toilet seat or a handicapped toilet?: No  TIMED UP AND GO:  Was the test performed?  No    Cognitive Function:    02/28/2023   10:52 AM 10/14/2017    9:30 AM 10/08/2016   11:27 AM 05/21/2015    2:59 PM  MMSE - Mini Mental State Exam  Not completed:    --  Orientation to time 5 5 4 5   Orientation to Place 5 5 5 5   Registration 3 3 3  3  Attention/ Calculation 5 5 5 5   Recall 2 3 1 1   Language- name 2 objects 2 2 2 2   Language- repeat 1 1 1 1   Language- follow 3 step command 3 3 3 3   Language- read & follow direction 1 1 1 1   Write a sentence 1 1 1 1   Copy design 1 1 1 1   Total score 29 30 27 28         11/10/2021    8:20 AM 10/31/2020    8:27 AM 10/30/2019    9:20 AM 10/16/2018   11:37 AM  6CIT Screen  What Year? 0 points 0 points 0 points 0 points  What month? 0 points 0 points 0 points 0 points  What time? 0 points 0 points 0 points 0 points  Count back from 20 2 points 0 points 0 points 0 points  Months in reverse 0 points 2 points 0 points 2 points  Repeat phrase 2 points 2 points 0 points 6 points  Total Score 4 points 4 points 0 points 8 points    Immunizations Immunization History  Administered Date(s) Administered   Moderna Sars-Covid-2 Vaccination  04/16/2019, 05/14/2019, 01/31/2020    TDAP status: Due, Education has been provided regarding the importance of this vaccine. Advised may receive this vaccine at local pharmacy or Health Dept. Aware to provide a copy of the vaccination record if obtained from local pharmacy or Health Dept. Verbalized acceptance and understanding.  Flu Vaccine status: Declined, Education has been provided regarding the importance of this vaccine but patient still declined. Advised may receive this vaccine at local pharmacy or Health Dept. Aware to provide a copy of the vaccination record if obtained from local pharmacy or Health Dept. Verbalized acceptance and understanding.  Pneumococcal vaccine status: Declined,  Education has been provided regarding the importance of this vaccine but patient still declined. Advised may receive this vaccine at local pharmacy or Health Dept. Aware to provide a copy of the vaccination record if obtained from local pharmacy or Health Dept. Verbalized acceptance and understanding.   Covid-19 vaccine status: Declined, Education has been provided regarding the importance of this vaccine but patient still declined. Advised may receive this vaccine at local pharmacy or Health Dept.or vaccine clinic. Aware to provide a copy of the vaccination record if obtained from local pharmacy or Health Dept. Verbalized acceptance and understanding.  Qualifies for Shingles Vaccine? Yes   Zostavax completed No   Shingrix Completed?: No.    Education has been provided regarding the importance of this vaccine. Patient has been advised to call insurance company to determine out of pocket expense if they have not yet received this vaccine. Advised may also receive vaccine at local pharmacy or Health Dept. Verbalized acceptance and understanding.  Screening Tests Health Maintenance  Topic Date Due   OPHTHALMOLOGY EXAM  05/11/2018   DTaP/Tdap/Td (1 - Tdap) 02/28/2024 (Originally 03/26/1964)   COVID-19 Vaccine  (4 - 2024-25 season) 02/28/2024 (Originally 10/17/2022)   INFLUENZA VACCINE  02/16/2048 (Originally 09/16/2022)   Diabetic kidney evaluation - Urine ACR  08/18/2023   HEMOGLOBIN A1C  08/18/2023   Diabetic kidney evaluation - eGFR measurement  02/18/2024   FOOT EXAM  02/18/2024   Medicare Annual Wellness (AWV)  02/28/2024   Hepatitis C Screening  Completed   HPV VACCINES  Aged Out   Pneumonia Vaccine 40+ Years old  Discontinued   Fecal DNA (Cologuard)  Discontinued   Zoster Vaccines- Shingrix  Discontinued    Health  Maintenance  Health Maintenance Due  Topic Date Due   OPHTHALMOLOGY EXAM  05/11/2018    Colorectal cancer screening: No longer required.   Lung Cancer Screening: (Low Dose CT Chest recommended if Age 68-80 years, 20 pack-year currently smoking OR have quit w/in 15years.) does not qualify.   Lung Cancer Screening Referral: na  Additional Screening:  Hepatitis C Screening: does qualify; Completed   Vision Screening: Recommended annual ophthalmology exams for early detection of glaucoma and other disorders of the eye. Is the patient up to date with their annual eye exam?  No  Who is the provider or what is the name of the office in which the patient attends annual eye exams? [facility] If pt is not established with a provider, would they like to be referred to a provider to establish care? No  Has to make up his mind to go.   Dental Screening: Recommended annual dental exams for proper oral hygiene  Diabetic Foot Exam: Diabetic Foot Exam: Completed 02/18/2023  Community Resource Referral / Chronic Care Management: CRR required this visit?  No   CCM required this visit?  No     Plan:     I have personally reviewed and noted the following in the patient's chart:   Medical and social history Use of alcohol, tobacco or illicit drugs  Current medications and supplements including opioid prescriptions. Patient is not currently taking opioid prescriptions. Functional  ability and status Nutritional status Physical activity Advanced directives List of other physicians Hospitalizations, surgeries, and ER visits in previous 12 months Vitals Screenings to include cognitive, depression, and falls Referrals and appointments  In addition, I have reviewed and discussed with patient certain preventive protocols, quality metrics, and best practice recommendations. A written personalized care plan for preventive services as well as general preventive health recommendations were provided to patient.     Harlene MARLA An, NP   02/28/2023   After Visit Summary: (In Person-Printed) AVS printed and given to the patient

## 2023-03-10 ENCOUNTER — Encounter (HOSPITAL_COMMUNITY): Payer: Self-pay | Admitting: Emergency Medicine

## 2023-03-10 ENCOUNTER — Emergency Department (HOSPITAL_COMMUNITY): Payer: Medicare HMO

## 2023-03-10 ENCOUNTER — Other Ambulatory Visit: Payer: Self-pay

## 2023-03-10 ENCOUNTER — Emergency Department (HOSPITAL_COMMUNITY)
Admission: EM | Admit: 2023-03-10 | Discharge: 2023-03-10 | Disposition: A | Discharge status: Home / Self Care | Payer: Medicare HMO | Attending: Emergency Medicine | Admitting: Emergency Medicine

## 2023-03-10 DIAGNOSIS — Z7982 Long term (current) use of aspirin: Secondary | ICD-10-CM | POA: Diagnosis not present

## 2023-03-10 DIAGNOSIS — Z20822 Contact with and (suspected) exposure to covid-19: Secondary | ICD-10-CM | POA: Diagnosis not present

## 2023-03-10 DIAGNOSIS — W182XXA Fall in (into) shower or empty bathtub, initial encounter: Secondary | ICD-10-CM | POA: Diagnosis not present

## 2023-03-10 DIAGNOSIS — J09X2 Influenza due to identified novel influenza A virus with other respiratory manifestations: Secondary | ICD-10-CM | POA: Diagnosis not present

## 2023-03-10 DIAGNOSIS — R531 Weakness: Secondary | ICD-10-CM | POA: Diagnosis not present

## 2023-03-10 DIAGNOSIS — J101 Influenza due to other identified influenza virus with other respiratory manifestations: Secondary | ICD-10-CM | POA: Diagnosis not present

## 2023-03-10 DIAGNOSIS — R000 Tachycardia, unspecified: Secondary | ICD-10-CM | POA: Diagnosis not present

## 2023-03-10 DIAGNOSIS — M160 Bilateral primary osteoarthritis of hip: Secondary | ICD-10-CM | POA: Diagnosis not present

## 2023-03-10 DIAGNOSIS — R059 Cough, unspecified: Secondary | ICD-10-CM | POA: Diagnosis not present

## 2023-03-10 DIAGNOSIS — W19XXXA Unspecified fall, initial encounter: Secondary | ICD-10-CM

## 2023-03-10 LAB — URINALYSIS, ROUTINE W REFLEX MICROSCOPIC
Bacteria, UA: NONE SEEN
Bilirubin Urine: NEGATIVE
Glucose, UA: NEGATIVE mg/dL
Hgb urine dipstick: NEGATIVE
Ketones, ur: NEGATIVE mg/dL
Leukocytes,Ua: NEGATIVE
Nitrite: NEGATIVE
Protein, ur: 100 mg/dL — AB
Specific Gravity, Urine: 1.025 (ref 1.005–1.030)
pH: 5 (ref 5.0–8.0)

## 2023-03-10 LAB — RESP PANEL BY RT-PCR (RSV, FLU A&B, COVID)  RVPGX2
Influenza A by PCR: POSITIVE — AB
Influenza B by PCR: NEGATIVE
Resp Syncytial Virus by PCR: NEGATIVE
SARS Coronavirus 2 by RT PCR: NEGATIVE

## 2023-03-10 LAB — BASIC METABOLIC PANEL
Anion gap: 16 — ABNORMAL HIGH (ref 5–15)
BUN: 21 mg/dL (ref 8–23)
CO2: 16 mmol/L — ABNORMAL LOW (ref 22–32)
Calcium: 8.7 mg/dL — ABNORMAL LOW (ref 8.9–10.3)
Chloride: 104 mmol/L (ref 98–111)
Creatinine, Ser: 0.93 mg/dL (ref 0.61–1.24)
GFR, Estimated: >60 mL/min (ref >60)
Glucose, Bld: 140 mg/dL — ABNORMAL HIGH (ref 70–99)
Potassium: 4 mmol/L (ref 3.5–5.1)
Sodium: 136 mmol/L (ref 135–145)

## 2023-03-10 LAB — CBC
HCT: 43.1 % (ref 39.0–52.0)
Hemoglobin: 14 g/dL (ref 13.0–17.0)
MCH: 31.5 pg (ref 26.0–34.0)
MCHC: 32.5 g/dL (ref 30.0–36.0)
MCV: 97.1 fL (ref 80.0–100.0)
Platelets: 156 10*3/uL (ref 150–400)
RBC: 4.44 MIL/uL (ref 4.22–5.81)
RDW: 13.8 % (ref 11.5–15.5)
WBC: 9.2 10*3/uL (ref 4.0–10.5)
nRBC: 0 % (ref 0.0–0.2)

## 2023-03-10 LAB — CBG MONITORING, ED: Glucose-Capillary: 153 mg/dL — ABNORMAL HIGH (ref 70–99)

## 2023-03-10 MED ORDER — OSELTAMIVIR PHOSPHATE 75 MG PO CAPS: 75.0000 mg | ORAL_CAPSULE | Freq: Two times a day (BID) | ORAL | 0 refills | Status: DC

## 2023-03-10 NOTE — ED Triage Notes (Signed)
Patient presents due to generalized weakness, multiple falls(walker), loss of appetite and a productive cough. Symptoms began this morning. He denies nausea, vomiting and chest pain.   HX Prostate Ca (taking oral treatment)  EMS vitals: 142/76 BP 80 HR 159 CBG

## 2023-03-10 NOTE — Discharge Instructions (Signed)
As we discussed, you have influenza A.  Please take Tamiflu as prescribed  You are also mildly dehydrated so please stay hydrated  See your doctor for follow-up  Take Tylenol or Motrin for fever  Return to ER if you have trouble breathing, vomiting, dehydration, fever

## 2023-03-10 NOTE — ED Provider Notes (Signed)
[place] EMERGENCY DEPARTMENT AT [facility] Provider Note   CSN: [id number] Arrival date & time: 03/10/23  1525     History  Chief Complaint  Patient presents with   Weakness   Cough   Fall    Samuel [name] is a 78 y.o. male here presenting with cough and weakness.  Patient states that he has been coughing for several days.  He has some nonproductive cough.  He states that today he was in the shower and fell and had a hard time getting up.  Denies any hip pain.  He denies any head injury or loss of consciousness.  Patient states that his wife is sick with similar symptoms.  The history is provided by the patient.       Home Medications Prior to Admission medications   Medication Sig Start Date End Date Taking? Authorizing Provider  aspirin EC 325 MG tablet Take 1 tablet (325 mg total) by mouth daily. 05/29/21   Sharon Seller, NP  atorvastatin (LIPITOR) 20 MG tablet Take 1 tablet (20 mg total) by mouth every morning. 02/18/23   Sharon Seller, NP  triptorelin (TRELESTAR LA) 11.25 MG injection Inject 11.25 mg into the muscle every 6 (six) months.    [provider]  XTANDI 40 MG tablet Take 4 tablets (160mg ) by mouth once daily as directed by physician. 02/03/22   Benjiman Core, MD      Allergies    Other and Peach [prunus persica]    Review of Systems   Review of Systems  Respiratory:  Positive for cough.   Neurological:  Positive for weakness.  All other systems reviewed and are negative.   Physical Exam Updated Vital Signs BP 135/68 (BP Location: Left Arm)   Pulse 84   Temp 98.7 F (37.1 C) (Oral)   Resp 18   SpO2 100%  Physical Exam Vitals and nursing note reviewed.  Constitutional:      Comments: Chronically ill-appearing  HENT:     Head: Normocephalic.     Nose: Nose normal.     Mouth/Throat:     Mouth: Mucous membranes are moist.  Eyes:     Extraocular Movements: Extraocular movements intact.     Pupils: Pupils are  equal, round, and reactive to light.  Cardiovascular:     Rate and Rhythm: Normal rate and regular rhythm.     Pulses: Normal pulses.     Heart sounds: Normal heart sounds.  Pulmonary:     Effort: Pulmonary effort is normal.     Breath sounds: Normal breath sounds.     Comments: Diminished bilaterally but no obvious wheezing or crackles Abdominal:     General: Abdomen is flat.     Palpations: Abdomen is soft.  Musculoskeletal:     Cervical back: Normal range of motion and neck supple.     Comments: No obvious extremity trauma.  Normal range of motion bilateral hips  Skin:    General: Skin is warm.     Capillary Refill: Capillary refill takes less than 2 seconds.  Neurological:     General: No focal deficit present.     Mental Status: He is alert and oriented to person, place, and time.  Psychiatric:        Mood and Affect: Mood normal.        Behavior: Behavior normal.     ED Results / Procedures / Treatments   Labs (all labs ordered are listed, but only abnormal  results are displayed) Labs Reviewed  CBG MONITORING, ED - Abnormal; Notable for the following components:      Result Value   Glucose-Capillary 153 (*)    All other components within normal limits  RESP PANEL BY RT-PCR (RSV, FLU A&B, COVID)  RVPGX2  CBC  BASIC METABOLIC PANEL  URINALYSIS, ROUTINE W REFLEX MICROSCOPIC    EKG None  Radiology No results found.  Procedures Procedures    Medications Ordered in ED Medications - No data to display  ED Course/ Medical Decision Making/ A&P                                 Medical Decision Making JONAVIN SKURA is a 78 y.o. male here presenting with cough and weakness and fall.  Patient had mechanical fall in the shower.  Patient had some coughing and diffuse weakness.  Consider pneumonia versus RSV versus flu.  Plan to get CBC and BMP and COVID RSV and flu and chest x-ray.  7:33 PM I reviewed patient's labs and independently interpreted imaging studies.   Chest x-ray is clear.  Patient is flu a positive.  CBC unremarkable.  BMP showed mild dehydration.  Patient is able to tolerate p.o. in the ED.  Since his symptoms are acute we will prescribe Tamiflu  Problems Addressed: Fall, initial encounter: acute illness or injury Influenza A: acute illness or injury  Amount and/or Complexity of Data Reviewed Labs: ordered. Decision-making details documented in ED Course. Radiology: ordered and independent interpretation performed. Decision-making details documented in ED Course.    Final Clinical Impression(s) / ED Diagnoses Final diagnoses:  None    Rx / DC Orders ED Discharge Orders     None         Charlynne Pander, MD 03/10/23 [id number]

## 2023-03-15 ENCOUNTER — Telehealth: Payer: Self-pay | Admitting: *Deleted

## 2023-03-15 NOTE — Progress Notes (Signed)
Transition Care Management Unsuccessful Follow-up Telephone Call  Date of discharge and from where:  [facility] 03/10/2023  Attempts:  1st Attempt  Reason for unsuccessful TCM follow-up call:  No answer/busy

## 2023-03-17 ENCOUNTER — Telehealth: Payer: Self-pay | Admitting: *Deleted

## 2023-03-17 NOTE — Progress Notes (Signed)
Transition Care Management Follow-up Telephone Call Date of discharge and from where: [facility] 03/10/2023 How have you been since you were released from the hospital? He's getting there not 100 % Any questions or concerns? No  Items Reviewed: Did the pt receive and understand the discharge instructions provided? Yes  Medications obtained and verified? No  Other? No  Any new allergies since your discharge? No  Dietary orders reviewed? No Do you have support at home? Yes     Follow up appointments reviewed:  PCP Hospital f/u appt confirmed? No  .  Just   Are transportation arrangements needed? No  If their condition worsens, is the pt aware to call PCP or go to the Emergency Dept.? Yes Was the patient provided with contact information for the PCP's office or ED? Yes Was to pt encouraged to call back with questions or concerns? Yes  Dione Booze  [facility] HealthPopulation Health Care Guide  Direct Dial:[phone] Fax:[phone] Website: [email or web address]

## 2023-03-28 DIAGNOSIS — H5213 Myopia, bilateral: Secondary | ICD-10-CM | POA: Diagnosis not present

## 2023-04-05 DIAGNOSIS — R052 Subacute cough: Secondary | ICD-10-CM | POA: Diagnosis not present

## 2023-04-05 DIAGNOSIS — J1089 Influenza due to other identified influenza virus with other manifestations: Secondary | ICD-10-CM | POA: Diagnosis not present

## 2023-04-05 DIAGNOSIS — J209 Acute bronchitis, unspecified: Secondary | ICD-10-CM | POA: Diagnosis not present

## 2023-04-05 DIAGNOSIS — R030 Elevated blood-pressure reading, without diagnosis of hypertension: Secondary | ICD-10-CM | POA: Diagnosis not present

## 2023-04-20 DIAGNOSIS — C61 Malignant neoplasm of prostate: Secondary | ICD-10-CM | POA: Diagnosis not present

## 2023-04-20 DIAGNOSIS — Z192 Hormone resistant malignancy status: Secondary | ICD-10-CM | POA: Diagnosis not present

## 2023-04-27 DIAGNOSIS — M8580 Other specified disorders of bone density and structure, unspecified site: Secondary | ICD-10-CM | POA: Diagnosis not present

## 2023-04-27 DIAGNOSIS — C61 Malignant neoplasm of prostate: Secondary | ICD-10-CM | POA: Diagnosis not present

## 2023-07-04 ENCOUNTER — Telehealth: Admitting: Nurse Practitioner

## 2023-07-04 NOTE — Telephone Encounter (Signed)
 Patient dropped off a handicapped placard form to be signed by provider. Given to [place] in CI

## 2023-07-04 NOTE — Telephone Encounter (Signed)
 Received handicap Placard. Filled out and placed in Jessica's Folder for review and signature.

## 2023-07-04 NOTE — Telephone Encounter (Signed)
 Patient notified Placard is ready for pick up.  Placed up front.  Copy sent to Scanning.

## 2023-08-11 ENCOUNTER — Telehealth: Payer: Self-pay | Admitting: Nurse Practitioner

## 2023-08-11 DIAGNOSIS — E782 Mixed hyperlipidemia: Secondary | ICD-10-CM

## 2023-08-11 MED ORDER — ATORVASTATIN CALCIUM 20 MG PO TABS: 20.0000 mg | ORAL_TABLET | Freq: Every morning | ORAL | 1 refills | Status: DC

## 2023-08-11 NOTE — Telephone Encounter (Signed)
 Copied from CRM [id number]. Topic: Clinical - Medication Refill >> Aug 11, 2023 12:02 PM Mercer PEDLAR wrote: Medication: atorvastatin  (LIPITOR) 20 MG tablet  Has the patient contacted their pharmacy? Yes (Agent: If no, request that the patient contact the pharmacy for the refill. If patient does not wish to contact the pharmacy document the reason why and proceed with request.) (Agent: If yes, when and what did the pharmacy advise?)  This is the patient's preferred pharmacy:  [facility] - [place] ([place]), [place] - [address] [place] ([place]) [place] [address] Phone: [phone] Fax: [phone]  Is this the correct pharmacy for this prescription? Yes If no, delete pharmacy and type the correct one.   Has the prescription been filled recently? No  Is the patient out of the medication? No  Has the patient been seen for an appointment in the last year OR does the patient have an upcoming appointment? Yes  Can we respond through MyChart? No  Agent: Please be advised that Rx refills may take up to 3 business days. We ask that you follow-up with your pharmacy.

## 2023-08-22 ENCOUNTER — Encounter: Payer: Self-pay | Admitting: Nurse Practitioner

## 2023-08-22 ENCOUNTER — Ambulatory Visit (INDEPENDENT_AMBULATORY_CARE_PROVIDER_SITE_OTHER): Payer: Medicare HMO | Admitting: Nurse Practitioner

## 2023-08-22 VITALS — BP 126/88 | HR 91 | Temp 97.2°F | Resp 18 | Ht 68.0 in | Wt 222.6 lb

## 2023-08-22 DIAGNOSIS — E1141 Type 2 diabetes mellitus with diabetic mononeuropathy: Secondary | ICD-10-CM

## 2023-08-22 DIAGNOSIS — E782 Mixed hyperlipidemia: Secondary | ICD-10-CM | POA: Diagnosis not present

## 2023-08-22 DIAGNOSIS — C61 Malignant neoplasm of prostate: Secondary | ICD-10-CM | POA: Diagnosis not present

## 2023-08-22 DIAGNOSIS — I10 Essential (primary) hypertension: Secondary | ICD-10-CM | POA: Diagnosis not present

## 2023-08-22 DIAGNOSIS — M8580 Other specified disorders of bone density and structure, unspecified site: Secondary | ICD-10-CM | POA: Diagnosis not present

## 2023-08-22 DIAGNOSIS — I700 Atherosclerosis of aorta: Secondary | ICD-10-CM

## 2023-08-22 NOTE — Assessment & Plan Note (Signed)
 Continues on lipitor.  Follow labs yearly

## 2023-08-22 NOTE — Assessment & Plan Note (Signed)
 Continues to follow up with urology No changes in symptoms Continues on xtandi 

## 2023-08-22 NOTE — Assessment & Plan Note (Signed)
 Continues on prolia   At risk due to treatment for prostate cancer

## 2023-08-22 NOTE — Patient Instructions (Signed)
 Call [facility] for yearly and diabetic eye exam

## 2023-08-22 NOTE — Progress Notes (Signed)
 Careteam: Patient Care Team: Caro Harlene POUR, NP as PCP - General (Geriatric Medicine) Nieves Cough, MD as Consulting Physician (Urology)  PLACE OF SERVICE:  [facility]  Advanced Directive information    Allergies  Allergen Reactions   Other Other (See Comments)    Patient stated,if I touch felt (material) I get knots on my body.   Peach [Prunus Persica] Other (See Comments)    Patient stated,if I touch or eat peach fuzz, I get knots all over my face and mouth.     Chief Complaint  Patient presents with   Medical Management of Chronic Issues     6 months follow-up needs to discuss eye exam ( patient has eye exam in the past with Dr. Octavia), Diabetic kidney evaluation-Urine ACR and Hemoglobin A1C ( orders are pending)      HPI:  Discussed the use of AI scribe software for clinical note transcription with the patient, who gave verbal consent to proceed.  History of Present Illness Samuel [name] is a 78 year old male who presents for a six-month follow-up visit.  He has a history of prostate cancer diagnosed in 2003, treated with radiation therapy in 2004. His PSA levels have fluctuated, reaching as high as twenty before decreasing to around three. Recently, his PSA increased slightly, but he did not receive a testosterone -lowering shot due to already low testosterone  levels. He continues to take four tablets of Xanti daily and is scheduled to see his urologist next month. A bone scan test confirmed that the cancer has not spread to his bones.  He receives Prolia  injections every six months for osteopenia through urology.  He has difficulty seeing out of one eye and has not had a diabetic eye exam in the last year, with the last one being approximately three years ago.  He has intentionally lost weight, reducing his weight from 265 pounds by cutting back on food intake, such as eating fewer pork chops. No abnormal aches, pains, or swelling in his legs. No chest  pain, shortness of breath, or issues with bowel movements. He is able to chew and swallow without difficulty.  He is currently taking aspirin  as part of his medication regimen.    Review of Systems:  Review of Systems  Constitutional:  Negative for chills, fever and weight loss.  HENT:  Negative for tinnitus.   Respiratory:  Negative for cough, sputum production and shortness of breath.   Cardiovascular:  Negative for chest pain, palpitations and leg swelling.  Gastrointestinal:  Negative for abdominal pain, constipation, diarrhea and heartburn.  Genitourinary:  Negative for dysuria, frequency and urgency.  Musculoskeletal:  Negative for back pain, falls, joint pain and myalgias.  Skin: Negative.   Neurological:  Negative for dizziness and headaches.  Psychiatric/Behavioral:  Negative for depression and memory loss. The patient does not have insomnia.     Past Medical History:  Diagnosis Date   Arthritis    Blind left eye    Cyst in hand 10/2013   left hand, treated with antibiotic & pain medicine   Diabetes mellitus without complication (HCC)    A1c 7.1% 04/28/20   Heart murmur    Hepatitis A    25 YRS AGO   Hyperlipidemia    Pre-diabetes    Prostate cancer (HCC)    Sleep apnea    ????   O2 DESAT  10/26/13  IN ED , no sleep study ever done per pt- no cpap   Past Surgical History:  Procedure Laterality Date   COLONOSCOPY     EYE SURGERY     LEFT AYE AS CHILD    JOINT REPLACEMENT Bilateral    LT KNEE 3 YRS AGO    MOUTH SURGERY  09/24/2020   MULTIPLE EXTRACTIONS WITH ALVEOLOPLASTY N/A 08/21/2020   Procedure: MULTIPLE EXTRACTION WITH ALVEOLOPLASTY;  Surgeon: Celena Lum NOVAK, DMD;  Location: [facility] OR;  Service: Dentistry;  Laterality: N/A;   TOTAL KNEE ARTHROPLASTY Right 12/04/2013   dr sheril   TOTAL KNEE ARTHROPLASTY Right 12/04/2013   Procedure: TOTAL KNEE ARTHROPLASTY;  Surgeon: Maude KANDICE sheril, MD;  Location: [facility] OR;  Service: Orthopedics;  Laterality: Right;    Social History:   reports that he quit smoking about 40 years ago. His smoking use included cigarettes. He started smoking about 45 years ago. He has a 2.5 pack-year smoking history. He has never used smokeless tobacco. He reports that he does not drink alcohol and does not use drugs.  Family History  Problem Relation Age of Onset   Stroke Mother    Heart attack Mother    Diabetes Mother    Stroke Father    Throat cancer Sister    Stomach cancer Brother    Prostate cancer Brother    Heart attack Brother    Seizures Brother    Colon cancer Neg Hx    Esophageal cancer Neg Hx    Rectal cancer Neg Hx     Medications: Patient's Medications  New Prescriptions   No medications on file  Previous Medications   ASPIRIN  EC 325 MG TABLET    Take 1 tablet (325 mg total) by mouth daily.   ATORVASTATIN  (LIPITOR) 20 MG TABLET    Take 1 tablet (20 mg total) by mouth every morning.   TRIPTORELIN (TRELESTAR LA) 11.25 MG INJECTION    Inject 11.25 mg into the muscle every 6 (six) months.   XTANDI  40 MG TABLET    Take 4 tablets (160mg ) by mouth once daily as directed by physician.  Modified Medications   No medications on file  Discontinued Medications   OSELTAMIVIR  (TAMIFLU ) 75 MG CAPSULE    Take 1 capsule (75 mg total) by mouth every 12 (twelve) hours.    Physical Exam:  Vitals:   08/22/23 1251  BP: 126/88  Pulse: 91  Resp: 18  Temp: (!) 97.2 F (36.2 C)  TempSrc: Temporal  SpO2: 96%  Weight: 222 lb 9.6 oz (101 kg)  Height: 5' 8 (1.727 m)   Body mass index is 33.85 kg/m. Wt Readings from Last 3 Encounters:  08/22/23 222 lb 9.6 oz (101 kg)  02/28/23 241 lb (109.3 kg)  02/18/23 241 lb (109.3 kg)    Physical Exam Constitutional:      General: He is not in acute distress.    Appearance: He is well-developed. He is not diaphoretic.  HENT:     Head: Normocephalic and atraumatic.     Right Ear: External ear normal.     Left Ear: External ear normal.     Mouth/Throat:      Pharynx: No oropharyngeal exudate.  Eyes:     Conjunctiva/sclera: Conjunctivae normal.     Pupils: Pupils are equal, round, and reactive to light.  Cardiovascular:     Rate and Rhythm: Normal rate and regular rhythm.     Heart sounds: Normal heart sounds.  Pulmonary:     Effort: Pulmonary effort is normal.     Breath sounds: Normal breath sounds.  Abdominal:  General: Bowel sounds are normal.     Palpations: Abdomen is soft.  Musculoskeletal:        General: No tenderness.     Cervical back: Normal range of motion and neck supple.     Right lower leg: No edema.     Left lower leg: No edema.  Skin:    General: Skin is warm and dry.  Neurological:     Mental Status: He is alert and oriented to person, place, and time.     Labs reviewed: Basic Metabolic Panel: Recent Labs    02/18/23 1514 03/10/23 1551  NA 139 136  K 4.2 4.0  CL 108 104  CO2 20 16*  GLUCOSE 103 140*  BUN 18 21  CREATININE 0.71 0.93  CALCIUM  9.4 8.7*   Liver Function Tests: Recent Labs    02/18/23 1514  AST 14  ALT 12  BILITOT 0.5  PROT 6.9   No results for input(s): LIPASE, AMYLASE in the last 8760 hours. No results for input(s): AMMONIA in the last 8760 hours. CBC: Recent Labs    02/18/23 1514 03/10/23 1551  WBC 5.9 9.2  NEUTROABS 2,437  --   HGB 13.8 14.0  HCT 40.2 43.1  MCV 91.6 97.1  PLT 261 156   Lipid Panel: No results for input(s): CHOL, HDL, LDLCALC, TRIG, CHOLHDL, LDLDIRECT in the last 8760 hours. TSH: No results for input(s): TSH in the last 8760 hours. A1C: Lab Results  Component Value Date   HGBA1C 6.7 (H) 02/18/2023     Assessment/Plan  Mixed hyperlipidemia Assessment & Plan: Continues on lipitor.  Follow labs yearly  Orders: -     Lipid panel -     Comprehensive metabolic panel with GFR  Type 2 diabetes mellitus with diabetic mononeuropathy, without long-term current use of insulin (HCC) Assessment & Plan: continue dietary  compliance--  off medication at this time, routine foot care/monitoring and to keep up with diabetic eye exams through ophthalmology    Orders: -     Microalbumin / creatinine urine ratio -     Hemoglobin A1c  Essential hypertension Assessment & Plan: Blood pressure well controlled, goal bp <140/90 Continue current medications and dietary modifications follow metabolic panel  Orders: -     CBC with Differential/Platelet -     Comprehensive metabolic panel with GFR  Prostate cancer (HCC) Assessment & Plan: Continues to follow up with urology No changes in symptoms Continues on xtandi    Osteopenia, unspecified location Assessment & Plan: Continues on prolia   At risk due to treatment for prostate cancer   Atherosclerosis of aorta [facility]) Assessment & Plan: Continues on asa and lipitor       Return in about 6 months (around 02/22/2024) for routine follow up.  Samuel [name] K. Caro BODILY [facility] [phone]

## 2023-08-22 NOTE — Assessment & Plan Note (Signed)
 Continues on asa and lipitor

## 2023-08-22 NOTE — Assessment & Plan Note (Signed)
 Blood pressure well controlled, goal bp <140/90 Continue current medications and dietary modifications follow metabolic panel

## 2023-08-22 NOTE — Assessment & Plan Note (Signed)
 continue dietary compliance--  off medication at this time, routine foot care/monitoring and to keep up with diabetic eye exams through ophthalmology

## 2023-08-23 ENCOUNTER — Ambulatory Visit: Payer: Self-pay | Admitting: Nurse Practitioner

## 2023-08-24 LAB — CBC WITH DIFFERENTIAL/PLATELET
Absolute Lymphocytes: 2836 {cells}/uL (ref 850–3900)
Absolute Monocytes: 418 {cells}/uL (ref 200–950)
Basophils Absolute: 29 {cells}/uL (ref 0–200)
Basophils Relative: 0.5 %
Eosinophils Absolute: 12 {cells}/uL — ABNORMAL LOW (ref 15–500)
Eosinophils Relative: 0.2 %
HCT: 42.1 % (ref 38.5–50.0)
Hemoglobin: 14.3 g/dL (ref 13.2–17.1)
MCH: 31.4 pg (ref 27.0–33.0)
MCHC: 34 g/dL (ref 32.0–36.0)
MCV: 92.5 fL (ref 80.0–100.0)
MPV: 10.2 fL (ref 7.5–12.5)
Monocytes Relative: 7.2 %
Neutro Abs: 2506 {cells}/uL (ref 1500–7800)
Neutrophils Relative %: 43.2 %
Platelets: 256 Thousand/uL (ref 140–400)
RBC: 4.55 Million/uL (ref 4.20–5.80)
RDW: 13.6 % (ref 11.0–15.0)
Total Lymphocyte: 48.9 %
WBC: 5.8 Thousand/uL (ref 3.8–10.8)

## 2023-08-24 LAB — LIPID PANEL
Cholesterol: 178 mg/dL (ref <200)
HDL: 79 mg/dL (ref >=40)
LDL Cholesterol (Calc): 82 mg/dL
Non-HDL Cholesterol (Calc): 99 mg/dL (ref <130)
Total CHOL/HDL Ratio: 2.3 (calc) (ref <5.0)
Triglycerides: 87 mg/dL (ref <150)

## 2023-08-24 LAB — MICROALBUMIN / CREATININE URINE RATIO
Creatinine, Urine: 71 mg/dL (ref 20–320)
Microalb, Ur: <0.2 mg/dL

## 2023-08-24 LAB — HEMOGLOBIN A1C
Hgb A1c MFr Bld: 6.5 % — ABNORMAL HIGH (ref <5.7)
Mean Plasma Glucose: 140 mg/dL
eAG (mmol/L): 7.7 mmol/L

## 2023-10-26 DIAGNOSIS — R3912 Poor urinary stream: Secondary | ICD-10-CM | POA: Diagnosis not present

## 2023-10-26 DIAGNOSIS — M8580 Other specified disorders of bone density and structure, unspecified site: Secondary | ICD-10-CM | POA: Diagnosis not present

## 2023-10-26 DIAGNOSIS — C61 Malignant neoplasm of prostate: Secondary | ICD-10-CM | POA: Diagnosis not present

## 2024-02-17 ENCOUNTER — Other Ambulatory Visit: Payer: Self-pay | Admitting: Nurse Practitioner

## 2024-02-17 DIAGNOSIS — E782 Mixed hyperlipidemia: Secondary | ICD-10-CM

## 2024-02-17 MED ORDER — ATORVASTATIN CALCIUM 20 MG PO TABS: 20.0000 mg | ORAL_TABLET | Freq: Every morning | ORAL | 1 refills | Status: AC

## 2024-02-17 NOTE — Telephone Encounter (Signed)
 Copied from CRM #[id number]. Topic: Clinical - Medication Refill >> Feb 17, 2024  8:29 AM Farrel B wrote: Medication: atorvastatin  (LIPITOR) 20 MG tablet  Has the patient contacted their pharmacy? No He said it showed no refill so he called to renew    This is the patient's preferred pharmacy:  [facility] - [place] ([place]), [place] - [address] [place] ([place]) [place] [address] Phone: [phone] Fax: [phone]   Is this the correct pharmacy for this prescription? Yes If no, delete pharmacy and type the correct one.   Has the prescription been filled recently? Yes  Is the patient out of the medication? Yes  Has the patient been seen for an appointment in the last year OR does the patient have an upcoming appointment? Yes  Can we respond through MyChart? No  Agent: Please be advised that Rx refills may take up to 3 business days. We ask that you follow-up with your pharmacy.

## 2024-02-24 ENCOUNTER — Ambulatory Visit: Payer: Self-pay | Admitting: Nurse Practitioner

## 2024-03-02 ENCOUNTER — Ambulatory Visit: Payer: Medicare HMO | Admitting: Nurse Practitioner

## 2024-03-02 ENCOUNTER — Encounter: Payer: Self-pay | Admitting: Nurse Practitioner

## 2024-03-02 VITALS — BP 128/78 | HR 80 | Temp 97.9°F | Ht 67.5 in | Wt 229.0 lb

## 2024-03-02 DIAGNOSIS — Z0000 Encounter for general adult medical examination without abnormal findings: Secondary | ICD-10-CM | POA: Diagnosis not present

## 2024-03-02 DIAGNOSIS — E1141 Type 2 diabetes mellitus with diabetic mononeuropathy: Secondary | ICD-10-CM | POA: Diagnosis not present

## 2024-03-02 NOTE — Patient Instructions (Signed)
 Samuel [name],  Thank you for taking the time for your Medicare Wellness Visit. I appreciate your continued commitment to your health goals. Please review the care plan we discussed, and feel free to reach out if I can assist you further.  Please note that Annual Wellness Visits do not include a physical exam. Some assessments may be limited, especially if the visit was conducted virtually. If needed, we may recommend an in-person follow-up with your provider.  Ongoing Care Seeing your primary care provider every 3 to 6 months helps us  monitor your health and provide consistent, personalized care.   Referrals If a referral was made during today's visit and you haven't received any updates within two weeks, please contact the referred provider directly to check on the status.  Recommended Screenings:  Health Maintenance  Topic Date Due   Kidney health urinalysis for diabetes  02/22/2024   Hemoglobin A1C  02/22/2024   Yearly kidney function blood test for diabetes  03/09/2024   Eye exam for diabetics  02/15/2025*   COVID-19 Vaccine (4 - 2025-26 season) 02/15/2025*   DTaP/Tdap/Td vaccine (1 - Tdap) 03/02/2025*   Flu Shot  02/16/2048*   Complete foot exam   03/02/2025   Medicare Annual Wellness Visit  03/02/2025   Hepatitis C Screening  Completed   Meningitis B Vaccine  Aged Out   Pneumococcal Vaccine for age over 62  Discontinued   Cologuard (Stool DNA test)  Discontinued   Zoster (Shingles) Vaccine  Discontinued  *Topic was postponed. The date shown is not the original due date.       03/02/2024   11:50 AM  Advanced Directives  Does Patient Have a Medical Advance Directive? No  Would patient like information on creating a medical advance directive? No - Patient declined    Vision: Annual vision screenings are recommended for early detection of glaucoma, cataracts, and diabetic retinopathy. These exams can also reveal signs of chronic conditions such as diabetes and high blood  pressure.  Dental: Annual dental screenings help detect early signs of oral cancer, gum disease, and other conditions linked to overall health, including heart disease and diabetes.  Please see the attached documents for additional preventive care recommendations.

## 2024-03-02 NOTE — Progress Notes (Signed)
 "  Chief Complaint  Patient presents with   Medicare Wellness    Annual wellness visit. Discussed need for eye exam (declined), covid booster(declined) diabetic kidney evaluation(provider to elaborate), A1c, td/tdap vaccine (declined), foot exam today,      Subjective:   Samuel [name] is a 79 y.o. male who presents for a Medicare Annual Wellness Visit.  Visit info / Clinical Intake: Medicare Wellness Visit Type:: Subsequent Annual Wellness Visit Persons participating in visit and providing information:: patient Medicare Wellness Visit Mode:: In-person (required for WTM) Interpreter Needed?: No Pre-visit prep was completed: no AWV questionnaire completed by patient prior to visit?: no Living arrangements:: lives with spouse/significant other Patient's Overall Health Status Rating: good Typical amount of pain: none Does pain affect daily life?: no Are you currently prescribed opioids?: no  Dietary Habits and Nutritional Risks How many meals a day?: 3 Eats fruit and vegetables daily?: yes Most meals are obtained by: preparing own meals In the last 2 weeks, have you had any of the following?: none Diabetic:: (!) yes Any non-healing wounds?: no How often do you check your BS?: 0 Would you like to be referred to a Nutritionist or for Diabetic Management? : no  Functional Status Activities of Daily Living (to include ambulation/medication): Independent Ambulation: Independent Medication Administration: Independent Home Management (perform basic housework or laundry): Independent Manage your own finances?: yes Primary transportation is: driving Concerns about vision?: no *vision screening is required for WTM* Concerns about hearing?: no  Fall Screening Falls in the past year?: 0 Number of falls in past year: 0 Was there an injury with Fall?: 0 Fall Risk Category Calculator: 0 Patient Fall Risk Level: Low Fall Risk  Fall Risk Patient at Risk for Falls Due to: No Fall  Risks Fall risk Follow up: Falls evaluation completed  Home and Transportation Safety: All rugs have non-skid backing?: N/A, no rugs All stairs or steps have railings?: N/A, no stairs Grab bars in the bathtub or shower?: yes Have non-skid surface in bathtub or shower?: (!) no Good home lighting?: yes Regular seat belt use?: yes Hospital stays in the last year:: no  Cognitive Assessment Difficulty concentrating, remembering, or making decisions? : no Will 6CIT or Mini Cog be Completed: yes What year is it?: 0 points What month is it?: 0 points Give patient an address phrase to remember (5 components): [address] [place] what time is it?: 0 points Count backwards from 20 to 1: 0 points Say the months of the year in reverse: 0 points Repeat the address phrase from earlier: 6 points (Missed, [place], [place], and [place]) 6 CIT Score: 6 points  Advance Directives (For Healthcare) Does Patient Have a Medical Advance Directive?: No Would patient like information on creating a medical advance directive?: No - Patient declined  Reviewed/Updated  Reviewed/Updated: Reviewed All (Medical, Surgical, Family, Medications, Allergies, Care Teams, Patient Goals)    Allergies (verified) Other and Peach [prunus persica]   Current Medications (verified) Outpatient Encounter Medications as of 03/02/2024  Medication Sig   aspirin  EC 325 MG tablet Take 1 tablet (325 mg total) by mouth daily.   atorvastatin  (LIPITOR) 20 MG tablet Take 1 tablet (20 mg total) by mouth every morning.   XTANDI  40 MG tablet Take 4 tablets (160mg ) by mouth once daily as directed by physician.   triptorelin (TRELESTAR LA) 11.25 MG injection Inject 11.25 mg into the muscle every 6 (six) months. (Patient not taking: Reported on 03/02/2024)   No facility-administered encounter medications  on file as of 03/02/2024.    History: Past Medical History:  Diagnosis Date   Arthritis    Blind left eye    Cyst  in hand 10/2013   left hand, treated with antibiotic & pain medicine   Diabetes mellitus without complication (HCC)    A1c 7.1% 04/28/20   Heart murmur    Hepatitis A    25 YRS AGO   Hyperlipidemia    Pre-diabetes    Prostate cancer (HCC)    Sleep apnea    ????   O2 DESAT  10/26/13  IN ED , no sleep study ever done per pt- no cpap   Past Surgical History:  Procedure Laterality Date   COLONOSCOPY     EYE SURGERY     LEFT AYE AS CHILD    JOINT REPLACEMENT Bilateral    LT KNEE 3 YRS AGO    MOUTH SURGERY  09/24/2020   MULTIPLE EXTRACTIONS WITH ALVEOLOPLASTY N/A 08/21/2020   Procedure: MULTIPLE EXTRACTION WITH ALVEOLOPLASTY;  Surgeon: Celena Lum NOVAK, DMD;  Location: [facility] OR;  Service: Dentistry;  Laterality: N/A;   TOTAL KNEE ARTHROPLASTY Right 12/04/2013   dr sheril   TOTAL KNEE ARTHROPLASTY Right 12/04/2013   Procedure: TOTAL KNEE ARTHROPLASTY;  Surgeon: Maude KANDICE Sheril, MD;  Location: [facility] OR;  Service: Orthopedics;  Laterality: Right;   Family History  Problem Relation Age of Onset   Stroke Mother    Heart attack Mother    Diabetes Mother    Stroke Father    Throat cancer Sister    Stomach cancer Brother    Prostate cancer Brother    Heart attack Brother    Seizures Brother    Colon cancer Neg Hx    Esophageal cancer Neg Hx    Rectal cancer Neg Hx    Social History   Occupational History   Not on file  Tobacco Use   Smoking status: Former    Current packs/day: 0.00    Average packs/day: 0.5 packs/day for 5.0 years (2.5 ttl pk-yrs)    Types: Cigarettes    Start date: 02/15/1978    Quit date: 02/16/1983    Years since quitting: 41.0   Smokeless tobacco: Never  Vaping Use   Vaping status: Never Used  Substance and Sexual Activity   Alcohol use: No    Alcohol/week: 0.0 standard drinks of alcohol   Drug use: No   Sexual activity: Not on file   Tobacco Counseling Counseling given: Not Answered  SDOH Screenings   Food Insecurity: No Food Insecurity  (02/02/2022)  Housing: Low Risk (01/28/2022)  Transportation Needs: No Transportation Needs (02/02/2022)  Utilities: Not At Risk (01/28/2022)  Depression (PHQ2-9): Low Risk (03/02/2024)  Tobacco Use: Medium Risk (03/02/2024)   See flowsheets for full screening details  Depression Screen PHQ 2 & 9 Depression Scale- Over the past 2 weeks, how often have you been bothered by any of the following problems? Little interest or pleasure in doing things: 0 Feeling down, depressed, or hopeless (PHQ Adolescent also includes...irritable): 0 PHQ-2 Total Score: 0     Goals Addressed   None          Objective:    Today's Vitals   03/02/24 1141  BP: 128/78  Pulse: 80  Temp: 97.9 F (36.6 C)  SpO2: 97%  Weight: 229 lb (103.9 kg)  Height: 5' 7.5 (1.715 m)   Body mass index is 35.34 kg/m.  Hearing/Vision screen Hearing Screening - Comments::  Hearing issues  Vision  Screening - Comments::  Eye exam greater than 12 months ago, declined to see eye doctor  Immunizations and Health Maintenance Health Maintenance  Topic Date Due   Diabetic kidney evaluation - Urine ACR  02/22/2024   HEMOGLOBIN A1C  02/22/2024   Diabetic kidney evaluation - eGFR measurement  03/09/2024   OPHTHALMOLOGY EXAM  02/15/2025 (Originally 05/11/2018)   COVID-19 Vaccine (4 - 2025-26 season) 02/15/2025 (Originally 10/17/2023)   DTaP/Tdap/Td (1 - Tdap) 03/02/2025 (Originally 03/26/1964)   Influenza Vaccine  02/16/2048 (Originally 09/16/2023)   FOOT EXAM  03/02/2025   Medicare Annual Wellness (AWV)  03/02/2025   Hepatitis C Screening  Completed   Meningococcal B Vaccine  Aged Out   Pneumococcal Vaccine: 50+ Years  Discontinued   Fecal DNA (Cologuard)  Discontinued   Zoster Vaccines- Shingrix  Discontinued        Assessment/Plan:  This is a routine wellness examination for Mattias.  Patient Care Team: Caro Harlene POUR, NP as PCP - General (Geriatric Medicine) Nieves Cough, MD as Consulting Physician  (Urology)  I have personally reviewed and noted the following in the patients chart:   Medical and social history Use of alcohol, tobacco or illicit drugs  Current medications and supplements including opioid prescriptions. Functional ability and status Nutritional status Physical activity Advanced directives List of other physicians Hospitalizations, surgeries, and ER visits in previous 12 months Vitals Screenings to include cognitive, depression, and falls Referrals and appointments  Orders Placed This Encounter  Procedures   Hemoglobin A1c   CBC with Differential/Platelet   Comprehensive metabolic panel with GFR   In addition, I have reviewed and discussed with patient certain preventive protocols, quality metrics, and best practice recommendations. A written personalized care plan for preventive services as well as general preventive health recommendations were provided to patient.   Harlene POUR Caro, NP   03/02/2024   Return in 1 year (on 03/02/2025).  After Visit Summary: (In Person-Printed) AVS printed and given to the patient   "

## 2024-03-03 LAB — COMPREHENSIVE METABOLIC PANEL WITH GFR
AG Ratio: 1.8 (calc) (ref 1.0–2.5)
ALT: 12 U/L (ref 9–46)
AST: 14 U/L (ref 10–35)
Albumin: 4.2 g/dL (ref 3.6–5.1)
Alkaline phosphatase (APISO): 77 U/L (ref 35–144)
BUN/Creatinine Ratio: 28 (calc) — ABNORMAL HIGH (ref 6–22)
BUN: 17 mg/dL (ref 7–25)
CO2: 21 mmol/L (ref 20–32)
Calcium: 9 mg/dL (ref 8.6–10.3)
Chloride: 107 mmol/L (ref 98–110)
Creat: 0.61 mg/dL — ABNORMAL LOW (ref 0.70–1.28)
Globulin: 2.4 g/dL (ref 1.9–3.7)
Glucose, Bld: 123 mg/dL (ref 65–139)
Potassium: 4.3 mmol/L (ref 3.5–5.3)
Sodium: 140 mmol/L (ref 135–146)
Total Bilirubin: 0.3 mg/dL (ref 0.2–1.2)
Total Protein: 6.6 g/dL (ref 6.1–8.1)
eGFR: 98 mL/min/1.73m2

## 2024-03-03 LAB — CBC WITH DIFFERENTIAL/PLATELET
Absolute Lymphocytes: 2343 {cells}/uL (ref 850–3900)
Absolute Monocytes: 479 {cells}/uL (ref 200–950)
Basophils Absolute: 40 {cells}/uL (ref 0–200)
Basophils Relative: 0.7 %
Eosinophils Absolute: 11 {cells}/uL — ABNORMAL LOW (ref 15–500)
Eosinophils Relative: 0.2 %
HCT: 39.2 % — ABNORMAL LOW (ref 39.4–51.1)
Hemoglobin: 12.9 g/dL — ABNORMAL LOW (ref 13.2–17.1)
MCH: 30 pg (ref 27.0–33.0)
MCHC: 32.9 g/dL (ref 31.6–35.4)
MCV: 91.2 fL (ref 81.4–101.7)
MPV: 11.2 fL (ref 7.5–12.5)
Monocytes Relative: 8.4 %
Neutro Abs: 2827 {cells}/uL (ref 1500–7800)
Neutrophils Relative %: 49.6 %
Platelets: 244 Thousand/uL (ref 140–400)
RBC: 4.3 Million/uL (ref 4.20–5.80)
RDW: 13 % (ref 11.0–15.0)
Total Lymphocyte: 41.1 %
WBC: 5.7 Thousand/uL (ref 3.8–10.8)

## 2024-03-03 LAB — HEMOGLOBIN A1C
Hgb A1c MFr Bld: 6.5 % — ABNORMAL HIGH
Mean Plasma Glucose: 140 mg/dL
eAG (mmol/L): 7.7 mmol/L

## 2024-03-03 LAB — MICROALBUMIN / CREATININE URINE RATIO
Creatinine, Urine: 95 mg/dL (ref 20–320)
Microalb Creat Ratio: 5 mg/g{creat}
Microalb, Ur: 0.5 mg/dL

## 2024-03-06 ENCOUNTER — Ambulatory Visit: Payer: Self-pay | Admitting: Nurse Practitioner

## 2024-03-06 DIAGNOSIS — D649 Anemia, unspecified: Secondary | ICD-10-CM

## 2024-03-16 ENCOUNTER — Encounter: Payer: Self-pay | Admitting: Nurse Practitioner

## 2024-03-16 ENCOUNTER — Ambulatory Visit: Admitting: Nurse Practitioner

## 2024-03-16 VITALS — BP 132/70 | HR 64 | Temp 97.3°F | Ht 67.5 in | Wt 219.6 lb

## 2024-03-16 DIAGNOSIS — I10 Essential (primary) hypertension: Secondary | ICD-10-CM

## 2024-03-16 DIAGNOSIS — E1141 Type 2 diabetes mellitus with diabetic mononeuropathy: Secondary | ICD-10-CM

## 2024-03-16 DIAGNOSIS — E782 Mixed hyperlipidemia: Secondary | ICD-10-CM | POA: Diagnosis not present

## 2024-03-16 DIAGNOSIS — C61 Malignant neoplasm of prostate: Secondary | ICD-10-CM

## 2024-03-16 DIAGNOSIS — D649 Anemia, unspecified: Secondary | ICD-10-CM | POA: Diagnosis not present

## 2024-03-16 NOTE — Progress Notes (Signed)
 "  Location:  [facility]  Provider: Caro Harlene POUR, NP  Goals of Care:     03/02/2024   11:50 AM  Advanced Directives  Does Patient Have a Medical Advance Directive? No  Would patient like information on creating a medical advance directive? No - Patient declined     Chief Complaint  Patient presents with   Medical Management of Chronic Issues    6 month follow-up. Moderate fall risk. Patient off balance since fall in December     HPI: Patient is a 79 y.o. male seen today for medical management of chronic diseases.   Pt states he has no specific complaints for the visit; A&Ox3. Pt well seeming; denies chest pain, ShOB, dyspnea on exertion, denies pain, weakness; asymmetric gain noted; pt states difficult gain since fall in 2024. Denies recent falls, states feeling great today.   Continues to follow up with urologist due to prostate cancer- no changes in symptoms.   Reports bowels moving well.   Eats well.    Past Medical History:  Diagnosis Date   Arthritis    Blind left eye    Cyst in hand 10/2013   left hand, treated with antibiotic & pain medicine   Diabetes mellitus without complication (HCC)    A1c 7.1% 04/28/20   Heart murmur    Hepatitis A    25 YRS AGO   Hyperlipidemia    Pre-diabetes    Prostate cancer (HCC)    Sleep apnea    ????   O2 DESAT  10/26/13  IN ED , no sleep study ever done per pt- no cpap    Past Surgical History:  Procedure Laterality Date   COLONOSCOPY     EYE SURGERY     LEFT AYE AS CHILD    JOINT REPLACEMENT Bilateral    LT KNEE 3 YRS AGO    MOUTH SURGERY  09/24/2020   MULTIPLE EXTRACTIONS WITH ALVEOLOPLASTY N/A 08/21/2020   Procedure: MULTIPLE EXTRACTION WITH ALVEOLOPLASTY;  Surgeon: Celena Lum NOVAK, DMD;  Location: [facility] OR;  Service: Dentistry;  Laterality: N/A;   TOTAL KNEE ARTHROPLASTY Right 12/04/2013   dr sheril   TOTAL KNEE ARTHROPLASTY Right 12/04/2013   Procedure: TOTAL KNEE ARTHROPLASTY;  Surgeon: Maude KANDICE Sheril,  MD;  Location: [facility] OR;  Service: Orthopedics;  Laterality: Right;    Allergies[1]  Allergies as of 03/16/2024       Reactions   Other Other (See Comments)   Patient stated,if I touch felt (material) I get knots on my body.   Peach [prunus Persica] Other (See Comments)   Patient stated,if I touch or eat peach fuzz, I get knots all over my face and mouth.         Medication List        Accurate as of March 16, 2024  2:27 PM. If you have any questions, ask your nurse or doctor.          aspirin  EC 325 MG tablet Take 1 tablet (325 mg total) by mouth daily.   atorvastatin  20 MG tablet Commonly known as: LIPITOR Take 1 tablet (20 mg total) by mouth every morning.   triptorelin 11.25 MG injection Commonly known as: TRELESTAR LA Inject 11.25 mg into the muscle every 6 (six) months.   Xtandi  40 MG tablet Generic drug: enzalutamide  Take 4 tablets (160mg ) by mouth once daily as directed by physician.        Review of Systems:  Review of Systems  Constitutional:  Negative  for activity change, fatigue, fever and unexpected weight change.  HENT: Negative.    Eyes: Negative.   Respiratory:  Negative for chest tightness, shortness of breath, wheezing and stridor.   Cardiovascular:  Negative for chest pain and leg swelling.  Gastrointestinal:  Negative for blood in stool, nausea and vomiting.  Endocrine: Negative.   Musculoskeletal: Negative.   Skin: Negative.   Neurological:  Negative for dizziness, tremors, syncope, weakness and headaches.  Psychiatric/Behavioral:  Negative for behavioral problems and confusion. The patient is not nervous/anxious.     Health Maintenance  Topic Date Due   OPHTHALMOLOGY EXAM  02/15/2025 (Originally 05/11/2018)   COVID-19 Vaccine (4 - 2025-26 season) 02/15/2025 (Originally 10/17/2023)   DTaP/Tdap/Td (1 - Tdap) 03/02/2025 (Originally 03/26/1964)   Influenza Vaccine  02/16/2048 (Originally 09/16/2023)   HEMOGLOBIN A1C  08/30/2024    Diabetic kidney evaluation - eGFR measurement  03/02/2025   Diabetic kidney evaluation - Urine ACR  03/02/2025   FOOT EXAM  03/02/2025   Medicare Annual Wellness (AWV)  03/02/2025   Hepatitis C Screening  Completed   Meningococcal B Vaccine  Aged Out   Pneumococcal Vaccine: 50+ Years  Discontinued   Fecal DNA (Cologuard)  Discontinued   Zoster Vaccines- Shingrix  Discontinued    Physical Exam: Vitals:   03/16/24 1411  BP: 132/70  Pulse: 64  Temp: (!) 97.3 F (36.3 C)  SpO2: 95%  Weight: 219 lb 9.6 oz (99.6 kg)  Height: 5' 7.5 (1.715 m)   Body mass index is 33.89 kg/m. Physical Exam Constitutional:      Appearance: Normal appearance. He is obese.  HENT:     Head: Normocephalic.     Right Ear: Tympanic membrane, ear canal and external ear normal.     Left Ear: Tympanic membrane, ear canal and external ear normal.     Nose: Nose normal.  Cardiovascular:     Rate and Rhythm: Normal rate.  Pulmonary:     Effort: Pulmonary effort is normal.     Breath sounds: Normal breath sounds.  Abdominal:     Palpations: Abdomen is soft.  Neurological:     Mental Status: He is alert and oriented to person, place, and time. Mental status is at baseline.  Psychiatric:        Mood and Affect: Mood normal.        Behavior: Behavior normal.     Labs reviewed: Basic Metabolic Panel: Recent Labs    03/02/24 1210  NA 140  K 4.3  CL 107  CO2 21  GLUCOSE 123  BUN 17  CREATININE 0.61*  CALCIUM  9.0   Liver Function Tests: Recent Labs    03/02/24 1210  AST 14  ALT 12  BILITOT 0.3  PROT 6.6   No results for input(s): LIPASE, AMYLASE in the last 8760 hours. No results for input(s): AMMONIA in the last 8760 hours. CBC: Recent Labs    08/22/23 1324 03/02/24 1210  WBC 5.8 5.7  NEUTROABS 2,506 2,827  HGB 14.3 12.9*  HCT 42.1 39.2*  MCV 92.5 91.2  PLT 256 244   Lipid Panel: Recent Labs    08/22/23 1324  CHOL 178  HDL 79  LDLCALC 82  TRIG 87  CHOLHDL 2.3    Lab Results  Component Value Date   HGBA1C 6.5 (H) 03/02/2024    Procedures since last visit: No results found.  Assessment/Plan 1. Type 2 diabetes mellitus with diabetic mononeuropathy, without long-term current use of insulin (HCC) (Primary) -pt education provided  on lifestyle and dietary modifications. -pt most recent A1c 6.5 and well controlled.  -Encouraged dietary compliance, routine foot care/monitoring and to keep up with diabetic eye exams through ophthalmology   2. Prostate cancer (HCC) -pt taking xtandi ; continue current therapy; pt followed by urology;   3. Anemia, unspecified type -most recent hgb 12.9, from 14.3 previously; iron panel ordered -pt education on recognition of new fatigue, dark stools, or other occult bleedings  4. Mixed hyperlipidemia -pt education on lifestyle modifications for LDL reduction  5. Essential hypertension -BP WNL today; well controlled with lifestyle changes; continue monitoring.  Return in about 6 months (around 09/13/2024) for routine follow up.  Dale Bound, Student-FNP -I personally was present during the history, physical exam and medical decision-making activities of this service and have verified that the service and findings are accurately documented in the students note Harlene K. Caro BODILY  [facility] [phone] 8 am - 5 pm) [phone] (after hours)     [1]  Allergies Allergen Reactions   Other Other (See Comments)    Patient stated,if I touch felt (material) I get knots on my body.   Peach [Prunus Persica] Other (See Comments)    Patient stated,if I touch or eat peach fuzz, I get knots all over my face and mouth.    "

## 2024-03-16 NOTE — Patient Instructions (Signed)
 We can order PT if you feel like you need help with strength or balance.

## 2024-03-17 LAB — CBC WITH DIFFERENTIAL/PLATELET
Absolute Lymphocytes: 2685 {cells}/uL (ref 850–3900)
Absolute Monocytes: 540 {cells}/uL (ref 200–950)
Basophils Absolute: 39 {cells}/uL (ref 0–200)
Basophils Relative: 0.6 %
Eosinophils Absolute: 20 {cells}/uL (ref 15–500)
Eosinophils Relative: 0.3 %
HCT: 38.2 % — ABNORMAL LOW (ref 39.4–51.1)
Hemoglobin: 12.9 g/dL — ABNORMAL LOW (ref 13.2–17.1)
MCH: 30.6 pg (ref 27.0–33.0)
MCHC: 33.8 g/dL (ref 31.6–35.4)
MCV: 90.7 fL (ref 81.4–101.7)
MPV: 11.5 fL (ref 7.5–12.5)
Monocytes Relative: 8.3 %
Neutro Abs: 3218 {cells}/uL (ref 1500–7800)
Neutrophils Relative %: 49.5 %
Platelets: 244 10*3/uL (ref 140–400)
RBC: 4.21 Million/uL (ref 4.20–5.80)
RDW: 13.1 % (ref 11.0–15.0)
Total Lymphocyte: 41.3 %
WBC: 6.5 10*3/uL (ref 3.8–10.8)

## 2024-03-17 LAB — IRON,TIBC AND FERRITIN PANEL
%SAT: 17 % — ABNORMAL LOW (ref 20–48)
Ferritin: 205 ng/mL (ref 24–380)
Iron: 54 ug/dL (ref 50–180)
TIBC: 326 ug/dL (ref 250–425)

## 2024-09-21 ENCOUNTER — Ambulatory Visit: Admitting: Nurse Practitioner

## 2025-03-04 ENCOUNTER — Ambulatory Visit: Admitting: Nurse Practitioner
